# Patient Record
Sex: Female | Born: 1935
Health system: Southern US, Community
[De-identification: ages and names within clinical notes are randomized; demographics above are authoritative.]

## PROBLEM LIST (undated history)

## (undated) DIAGNOSIS — K219 Gastro-esophageal reflux disease without esophagitis: Secondary | ICD-10-CM

## (undated) DIAGNOSIS — E785 Hyperlipidemia, unspecified: Secondary | ICD-10-CM

## (undated) DIAGNOSIS — I10 Essential (primary) hypertension: Secondary | ICD-10-CM

## (undated) DIAGNOSIS — J449 Chronic obstructive pulmonary disease, unspecified: Secondary | ICD-10-CM

## (undated) DIAGNOSIS — I2510 Atherosclerotic heart disease of native coronary artery without angina pectoris: Secondary | ICD-10-CM

## (undated) DIAGNOSIS — E119 Type 2 diabetes mellitus without complications: Secondary | ICD-10-CM

## (undated) DIAGNOSIS — Z8673 Personal history of transient ischemic attack (TIA), and cerebral infarction without residual deficits: Secondary | ICD-10-CM

## (undated) HISTORY — PX: CHOLECYSTECTOMY: SHX55

---

## 2006-01-18 ENCOUNTER — Emergency Department (HOSPITAL_COMMUNITY): Admission: EM | Admit: 2006-01-18 | Discharge: 2006-01-18 | Payer: Self-pay | Admitting: Emergency Medicine

## 2012-05-12 DIAGNOSIS — M810 Age-related osteoporosis without current pathological fracture: Secondary | ICD-10-CM | POA: Diagnosis not present

## 2012-05-12 DIAGNOSIS — Z79899 Other long term (current) drug therapy: Secondary | ICD-10-CM | POA: Diagnosis not present

## 2012-05-12 DIAGNOSIS — I10 Essential (primary) hypertension: Secondary | ICD-10-CM | POA: Diagnosis not present

## 2012-05-12 DIAGNOSIS — Z1239 Encounter for other screening for malignant neoplasm of breast: Secondary | ICD-10-CM | POA: Diagnosis not present

## 2012-05-12 DIAGNOSIS — E7800 Pure hypercholesterolemia, unspecified: Secondary | ICD-10-CM | POA: Diagnosis not present

## 2012-05-12 DIAGNOSIS — E119 Type 2 diabetes mellitus without complications: Secondary | ICD-10-CM | POA: Diagnosis not present

## 2012-05-12 DIAGNOSIS — K219 Gastro-esophageal reflux disease without esophagitis: Secondary | ICD-10-CM | POA: Diagnosis not present

## 2012-05-28 DIAGNOSIS — Z1231 Encounter for screening mammogram for malignant neoplasm of breast: Secondary | ICD-10-CM | POA: Diagnosis not present

## 2012-05-28 DIAGNOSIS — M810 Age-related osteoporosis without current pathological fracture: Secondary | ICD-10-CM | POA: Diagnosis not present

## 2012-06-16 DIAGNOSIS — J3089 Other allergic rhinitis: Secondary | ICD-10-CM | POA: Diagnosis not present

## 2012-07-21 DIAGNOSIS — E559 Vitamin D deficiency, unspecified: Secondary | ICD-10-CM | POA: Diagnosis not present

## 2012-07-21 DIAGNOSIS — E119 Type 2 diabetes mellitus without complications: Secondary | ICD-10-CM | POA: Diagnosis not present

## 2012-07-21 DIAGNOSIS — I10 Essential (primary) hypertension: Secondary | ICD-10-CM | POA: Diagnosis not present

## 2012-07-21 DIAGNOSIS — E7800 Pure hypercholesterolemia, unspecified: Secondary | ICD-10-CM | POA: Diagnosis not present

## 2012-07-21 DIAGNOSIS — M818 Other osteoporosis without current pathological fracture: Secondary | ICD-10-CM | POA: Diagnosis not present

## 2012-09-20 DIAGNOSIS — H353190 Nonexudative age-related macular degeneration, unspecified eye, stage unspecified: Secondary | ICD-10-CM | POA: Diagnosis not present

## 2012-09-20 DIAGNOSIS — H26499 Other secondary cataract, unspecified eye: Secondary | ICD-10-CM | POA: Diagnosis not present

## 2012-10-21 DIAGNOSIS — M810 Age-related osteoporosis without current pathological fracture: Secondary | ICD-10-CM | POA: Diagnosis not present

## 2012-10-21 DIAGNOSIS — Z79899 Other long term (current) drug therapy: Secondary | ICD-10-CM | POA: Diagnosis not present

## 2012-10-21 DIAGNOSIS — E7800 Pure hypercholesterolemia, unspecified: Secondary | ICD-10-CM | POA: Diagnosis not present

## 2012-10-21 DIAGNOSIS — Z23 Encounter for immunization: Secondary | ICD-10-CM | POA: Diagnosis not present

## 2012-10-21 DIAGNOSIS — I10 Essential (primary) hypertension: Secondary | ICD-10-CM | POA: Diagnosis not present

## 2012-10-21 DIAGNOSIS — E119 Type 2 diabetes mellitus without complications: Secondary | ICD-10-CM | POA: Diagnosis not present

## 2012-10-29 DIAGNOSIS — E7800 Pure hypercholesterolemia, unspecified: Secondary | ICD-10-CM | POA: Diagnosis not present

## 2012-10-29 DIAGNOSIS — E119 Type 2 diabetes mellitus without complications: Secondary | ICD-10-CM | POA: Diagnosis not present

## 2012-10-29 DIAGNOSIS — Z79899 Other long term (current) drug therapy: Secondary | ICD-10-CM | POA: Diagnosis not present

## 2013-01-27 DIAGNOSIS — R0602 Shortness of breath: Secondary | ICD-10-CM | POA: Diagnosis not present

## 2013-01-27 DIAGNOSIS — R0609 Other forms of dyspnea: Secondary | ICD-10-CM | POA: Diagnosis not present

## 2013-01-27 DIAGNOSIS — E119 Type 2 diabetes mellitus without complications: Secondary | ICD-10-CM | POA: Diagnosis not present

## 2013-01-27 DIAGNOSIS — E7800 Pure hypercholesterolemia, unspecified: Secondary | ICD-10-CM | POA: Diagnosis not present

## 2013-01-27 DIAGNOSIS — I517 Cardiomegaly: Secondary | ICD-10-CM | POA: Diagnosis not present

## 2013-01-27 DIAGNOSIS — R0989 Other specified symptoms and signs involving the circulatory and respiratory systems: Secondary | ICD-10-CM | POA: Diagnosis not present

## 2013-01-27 DIAGNOSIS — I10 Essential (primary) hypertension: Secondary | ICD-10-CM | POA: Diagnosis not present

## 2013-01-27 DIAGNOSIS — Z79899 Other long term (current) drug therapy: Secondary | ICD-10-CM | POA: Diagnosis not present

## 2013-02-14 DIAGNOSIS — R498 Other voice and resonance disorders: Secondary | ICD-10-CM | POA: Diagnosis not present

## 2013-03-24 DIAGNOSIS — H353190 Nonexudative age-related macular degeneration, unspecified eye, stage unspecified: Secondary | ICD-10-CM | POA: Diagnosis not present

## 2013-03-24 DIAGNOSIS — H524 Presbyopia: Secondary | ICD-10-CM | POA: Diagnosis not present

## 2013-05-03 DIAGNOSIS — Z79899 Other long term (current) drug therapy: Secondary | ICD-10-CM | POA: Diagnosis not present

## 2013-05-03 DIAGNOSIS — E119 Type 2 diabetes mellitus without complications: Secondary | ICD-10-CM | POA: Diagnosis not present

## 2013-05-03 DIAGNOSIS — I10 Essential (primary) hypertension: Secondary | ICD-10-CM | POA: Diagnosis not present

## 2013-05-03 DIAGNOSIS — E7800 Pure hypercholesterolemia, unspecified: Secondary | ICD-10-CM | POA: Diagnosis not present

## 2013-05-09 DIAGNOSIS — M818 Other osteoporosis without current pathological fracture: Secondary | ICD-10-CM | POA: Diagnosis not present

## 2013-07-04 DIAGNOSIS — S82899A Other fracture of unspecified lower leg, initial encounter for closed fracture: Secondary | ICD-10-CM | POA: Diagnosis not present

## 2013-07-04 DIAGNOSIS — I10 Essential (primary) hypertension: Secondary | ICD-10-CM | POA: Diagnosis not present

## 2013-07-04 DIAGNOSIS — K219 Gastro-esophageal reflux disease without esophagitis: Secondary | ICD-10-CM | POA: Diagnosis not present

## 2013-07-04 DIAGNOSIS — M545 Low back pain, unspecified: Secondary | ICD-10-CM | POA: Diagnosis not present

## 2013-07-04 DIAGNOSIS — W010XXA Fall on same level from slipping, tripping and stumbling without subsequent striking against object, initial encounter: Secondary | ICD-10-CM | POA: Diagnosis not present

## 2013-07-04 DIAGNOSIS — E7800 Pure hypercholesterolemia, unspecified: Secondary | ICD-10-CM | POA: Diagnosis not present

## 2013-07-06 DIAGNOSIS — M25476 Effusion, unspecified foot: Secondary | ICD-10-CM | POA: Diagnosis not present

## 2013-07-06 DIAGNOSIS — M25579 Pain in unspecified ankle and joints of unspecified foot: Secondary | ICD-10-CM | POA: Diagnosis not present

## 2013-07-06 DIAGNOSIS — M25473 Effusion, unspecified ankle: Secondary | ICD-10-CM | POA: Diagnosis not present

## 2013-07-06 DIAGNOSIS — S8263XA Displaced fracture of lateral malleolus of unspecified fibula, initial encounter for closed fracture: Secondary | ICD-10-CM | POA: Diagnosis not present

## 2013-07-13 DIAGNOSIS — M25476 Effusion, unspecified foot: Secondary | ICD-10-CM | POA: Diagnosis not present

## 2013-07-13 DIAGNOSIS — S8263XA Displaced fracture of lateral malleolus of unspecified fibula, initial encounter for closed fracture: Secondary | ICD-10-CM | POA: Diagnosis not present

## 2013-07-13 DIAGNOSIS — M25473 Effusion, unspecified ankle: Secondary | ICD-10-CM | POA: Diagnosis not present

## 2013-07-13 DIAGNOSIS — M25579 Pain in unspecified ankle and joints of unspecified foot: Secondary | ICD-10-CM | POA: Diagnosis not present

## 2013-07-20 DIAGNOSIS — M25473 Effusion, unspecified ankle: Secondary | ICD-10-CM | POA: Diagnosis not present

## 2013-07-20 DIAGNOSIS — M25476 Effusion, unspecified foot: Secondary | ICD-10-CM | POA: Diagnosis not present

## 2013-07-20 DIAGNOSIS — S8263XA Displaced fracture of lateral malleolus of unspecified fibula, initial encounter for closed fracture: Secondary | ICD-10-CM | POA: Diagnosis not present

## 2013-07-20 DIAGNOSIS — M25579 Pain in unspecified ankle and joints of unspecified foot: Secondary | ICD-10-CM | POA: Diagnosis not present

## 2013-08-03 DIAGNOSIS — E119 Type 2 diabetes mellitus without complications: Secondary | ICD-10-CM | POA: Diagnosis not present

## 2013-08-03 DIAGNOSIS — R05 Cough: Secondary | ICD-10-CM | POA: Diagnosis not present

## 2013-08-03 DIAGNOSIS — M810 Age-related osteoporosis without current pathological fracture: Secondary | ICD-10-CM | POA: Diagnosis not present

## 2013-08-03 DIAGNOSIS — E7800 Pure hypercholesterolemia, unspecified: Secondary | ICD-10-CM | POA: Diagnosis not present

## 2013-08-03 DIAGNOSIS — K219 Gastro-esophageal reflux disease without esophagitis: Secondary | ICD-10-CM | POA: Diagnosis not present

## 2013-08-03 DIAGNOSIS — I10 Essential (primary) hypertension: Secondary | ICD-10-CM | POA: Diagnosis not present

## 2013-08-17 DIAGNOSIS — S8263XA Displaced fracture of lateral malleolus of unspecified fibula, initial encounter for closed fracture: Secondary | ICD-10-CM | POA: Diagnosis not present

## 2013-08-22 DIAGNOSIS — S8263XA Displaced fracture of lateral malleolus of unspecified fibula, initial encounter for closed fracture: Secondary | ICD-10-CM | POA: Diagnosis not present

## 2013-08-25 DIAGNOSIS — S8263XA Displaced fracture of lateral malleolus of unspecified fibula, initial encounter for closed fracture: Secondary | ICD-10-CM | POA: Diagnosis not present

## 2013-08-30 DIAGNOSIS — S8263XA Displaced fracture of lateral malleolus of unspecified fibula, initial encounter for closed fracture: Secondary | ICD-10-CM | POA: Diagnosis not present

## 2013-09-01 DIAGNOSIS — S8263XA Displaced fracture of lateral malleolus of unspecified fibula, initial encounter for closed fracture: Secondary | ICD-10-CM | POA: Diagnosis not present

## 2013-09-06 DIAGNOSIS — S8263XA Displaced fracture of lateral malleolus of unspecified fibula, initial encounter for closed fracture: Secondary | ICD-10-CM | POA: Diagnosis not present

## 2013-09-09 DIAGNOSIS — S8263XA Displaced fracture of lateral malleolus of unspecified fibula, initial encounter for closed fracture: Secondary | ICD-10-CM | POA: Diagnosis not present

## 2013-09-13 DIAGNOSIS — S8263XA Displaced fracture of lateral malleolus of unspecified fibula, initial encounter for closed fracture: Secondary | ICD-10-CM | POA: Diagnosis not present

## 2013-09-14 DIAGNOSIS — E119 Type 2 diabetes mellitus without complications: Secondary | ICD-10-CM | POA: Diagnosis not present

## 2013-09-14 DIAGNOSIS — I10 Essential (primary) hypertension: Secondary | ICD-10-CM | POA: Diagnosis not present

## 2013-09-14 DIAGNOSIS — Z23 Encounter for immunization: Secondary | ICD-10-CM | POA: Diagnosis not present

## 2013-09-14 DIAGNOSIS — Z79899 Other long term (current) drug therapy: Secondary | ICD-10-CM | POA: Diagnosis not present

## 2013-09-14 DIAGNOSIS — E7800 Pure hypercholesterolemia, unspecified: Secondary | ICD-10-CM | POA: Diagnosis not present

## 2013-09-16 DIAGNOSIS — S8263XA Displaced fracture of lateral malleolus of unspecified fibula, initial encounter for closed fracture: Secondary | ICD-10-CM | POA: Diagnosis not present

## 2013-09-23 DIAGNOSIS — S8263XA Displaced fracture of lateral malleolus of unspecified fibula, initial encounter for closed fracture: Secondary | ICD-10-CM | POA: Diagnosis not present

## 2013-12-19 DIAGNOSIS — E559 Vitamin D deficiency, unspecified: Secondary | ICD-10-CM | POA: Diagnosis not present

## 2013-12-19 DIAGNOSIS — E1149 Type 2 diabetes mellitus with other diabetic neurological complication: Secondary | ICD-10-CM | POA: Diagnosis not present

## 2013-12-19 DIAGNOSIS — M810 Age-related osteoporosis without current pathological fracture: Secondary | ICD-10-CM | POA: Diagnosis not present

## 2013-12-19 DIAGNOSIS — Z0000 Encounter for general adult medical examination without abnormal findings: Secondary | ICD-10-CM | POA: Diagnosis not present

## 2013-12-19 DIAGNOSIS — Z79899 Other long term (current) drug therapy: Secondary | ICD-10-CM | POA: Diagnosis not present

## 2013-12-19 DIAGNOSIS — I10 Essential (primary) hypertension: Secondary | ICD-10-CM | POA: Diagnosis not present

## 2013-12-19 DIAGNOSIS — E119 Type 2 diabetes mellitus without complications: Secondary | ICD-10-CM | POA: Diagnosis not present

## 2013-12-19 DIAGNOSIS — I2510 Atherosclerotic heart disease of native coronary artery without angina pectoris: Secondary | ICD-10-CM | POA: Diagnosis not present

## 2013-12-19 DIAGNOSIS — E7800 Pure hypercholesterolemia, unspecified: Secondary | ICD-10-CM | POA: Diagnosis not present

## 2014-03-20 DIAGNOSIS — Z23 Encounter for immunization: Secondary | ICD-10-CM | POA: Diagnosis not present

## 2014-03-20 DIAGNOSIS — I10 Essential (primary) hypertension: Secondary | ICD-10-CM | POA: Diagnosis not present

## 2014-03-20 DIAGNOSIS — E1149 Type 2 diabetes mellitus with other diabetic neurological complication: Secondary | ICD-10-CM | POA: Diagnosis not present

## 2014-03-20 DIAGNOSIS — E7800 Pure hypercholesterolemia, unspecified: Secondary | ICD-10-CM | POA: Diagnosis not present

## 2014-06-20 DIAGNOSIS — Z79899 Other long term (current) drug therapy: Secondary | ICD-10-CM | POA: Diagnosis not present

## 2014-06-20 DIAGNOSIS — E7800 Pure hypercholesterolemia, unspecified: Secondary | ICD-10-CM | POA: Diagnosis not present

## 2014-06-20 DIAGNOSIS — I10 Essential (primary) hypertension: Secondary | ICD-10-CM | POA: Diagnosis not present

## 2014-06-20 DIAGNOSIS — E1149 Type 2 diabetes mellitus with other diabetic neurological complication: Secondary | ICD-10-CM | POA: Diagnosis not present

## 2014-06-20 DIAGNOSIS — J0100 Acute maxillary sinusitis, unspecified: Secondary | ICD-10-CM | POA: Diagnosis not present

## 2014-07-24 DIAGNOSIS — K219 Gastro-esophageal reflux disease without esophagitis: Secondary | ICD-10-CM | POA: Diagnosis not present

## 2014-07-24 DIAGNOSIS — I10 Essential (primary) hypertension: Secondary | ICD-10-CM | POA: Diagnosis not present

## 2014-08-31 DIAGNOSIS — J0110 Acute frontal sinusitis, unspecified: Secondary | ICD-10-CM | POA: Diagnosis not present

## 2014-10-24 DIAGNOSIS — J0100 Acute maxillary sinusitis, unspecified: Secondary | ICD-10-CM | POA: Diagnosis not present

## 2014-10-24 DIAGNOSIS — Z23 Encounter for immunization: Secondary | ICD-10-CM | POA: Diagnosis not present

## 2014-11-03 DIAGNOSIS — I10 Essential (primary) hypertension: Secondary | ICD-10-CM | POA: Diagnosis not present

## 2014-11-03 DIAGNOSIS — E669 Obesity, unspecified: Secondary | ICD-10-CM | POA: Diagnosis present

## 2014-11-03 DIAGNOSIS — I517 Cardiomegaly: Secondary | ICD-10-CM | POA: Diagnosis not present

## 2014-11-03 DIAGNOSIS — J9621 Acute and chronic respiratory failure with hypoxia: Secondary | ICD-10-CM | POA: Diagnosis not present

## 2014-11-03 DIAGNOSIS — E785 Hyperlipidemia, unspecified: Secondary | ICD-10-CM | POA: Diagnosis not present

## 2014-11-03 DIAGNOSIS — R05 Cough: Secondary | ICD-10-CM | POA: Diagnosis not present

## 2014-11-03 DIAGNOSIS — J440 Chronic obstructive pulmonary disease with acute lower respiratory infection: Secondary | ICD-10-CM | POA: Diagnosis present

## 2014-11-03 DIAGNOSIS — J209 Acute bronchitis, unspecified: Secondary | ICD-10-CM | POA: Diagnosis not present

## 2014-11-03 DIAGNOSIS — R5381 Other malaise: Secondary | ICD-10-CM | POA: Diagnosis not present

## 2014-11-03 DIAGNOSIS — J180 Bronchopneumonia, unspecified organism: Secondary | ICD-10-CM | POA: Diagnosis not present

## 2014-11-03 DIAGNOSIS — J441 Chronic obstructive pulmonary disease with (acute) exacerbation: Secondary | ICD-10-CM | POA: Diagnosis not present

## 2014-11-03 DIAGNOSIS — R069 Unspecified abnormalities of breathing: Secondary | ICD-10-CM | POA: Diagnosis not present

## 2014-11-03 DIAGNOSIS — E119 Type 2 diabetes mellitus without complications: Secondary | ICD-10-CM | POA: Diagnosis present

## 2014-11-03 DIAGNOSIS — J9691 Respiratory failure, unspecified with hypoxia: Secondary | ICD-10-CM | POA: Diagnosis not present

## 2014-11-03 DIAGNOSIS — J9601 Acute respiratory failure with hypoxia: Secondary | ICD-10-CM | POA: Diagnosis not present

## 2014-11-03 DIAGNOSIS — R1312 Dysphagia, oropharyngeal phase: Secondary | ICD-10-CM | POA: Diagnosis not present

## 2014-11-03 DIAGNOSIS — J80 Acute respiratory distress syndrome: Secondary | ICD-10-CM | POA: Diagnosis not present

## 2014-11-03 DIAGNOSIS — R0602 Shortness of breath: Secondary | ICD-10-CM | POA: Diagnosis not present

## 2014-11-03 DIAGNOSIS — J9690 Respiratory failure, unspecified, unspecified whether with hypoxia or hypercapnia: Secondary | ICD-10-CM | POA: Diagnosis not present

## 2014-11-03 DIAGNOSIS — J9622 Acute and chronic respiratory failure with hypercapnia: Secondary | ICD-10-CM | POA: Diagnosis present

## 2014-11-03 DIAGNOSIS — J189 Pneumonia, unspecified organism: Secondary | ICD-10-CM | POA: Diagnosis not present

## 2014-11-03 DIAGNOSIS — Z79899 Other long term (current) drug therapy: Secondary | ICD-10-CM | POA: Diagnosis not present

## 2014-11-08 DIAGNOSIS — J80 Acute respiratory distress syndrome: Secondary | ICD-10-CM | POA: Diagnosis not present

## 2014-11-08 DIAGNOSIS — J441 Chronic obstructive pulmonary disease with (acute) exacerbation: Secondary | ICD-10-CM | POA: Diagnosis not present

## 2014-11-08 DIAGNOSIS — J189 Pneumonia, unspecified organism: Secondary | ICD-10-CM | POA: Diagnosis not present

## 2014-11-08 DIAGNOSIS — R1312 Dysphagia, oropharyngeal phase: Secondary | ICD-10-CM | POA: Diagnosis not present

## 2014-11-08 DIAGNOSIS — R1310 Dysphagia, unspecified: Secondary | ICD-10-CM | POA: Diagnosis not present

## 2014-11-08 DIAGNOSIS — J9621 Acute and chronic respiratory failure with hypoxia: Secondary | ICD-10-CM | POA: Diagnosis not present

## 2014-11-08 DIAGNOSIS — I10 Essential (primary) hypertension: Secondary | ICD-10-CM | POA: Diagnosis not present

## 2014-11-08 DIAGNOSIS — J9601 Acute respiratory failure with hypoxia: Secondary | ICD-10-CM | POA: Diagnosis not present

## 2014-11-08 DIAGNOSIS — E785 Hyperlipidemia, unspecified: Secondary | ICD-10-CM | POA: Diagnosis not present

## 2014-11-08 DIAGNOSIS — J9691 Respiratory failure, unspecified with hypoxia: Secondary | ICD-10-CM | POA: Diagnosis not present

## 2014-11-08 DIAGNOSIS — J180 Bronchopneumonia, unspecified organism: Secondary | ICD-10-CM | POA: Diagnosis not present

## 2014-11-08 DIAGNOSIS — R5381 Other malaise: Secondary | ICD-10-CM | POA: Diagnosis not present

## 2014-11-13 DIAGNOSIS — R1310 Dysphagia, unspecified: Secondary | ICD-10-CM | POA: Diagnosis not present

## 2014-11-13 DIAGNOSIS — J189 Pneumonia, unspecified organism: Secondary | ICD-10-CM | POA: Diagnosis not present

## 2014-11-13 DIAGNOSIS — J441 Chronic obstructive pulmonary disease with (acute) exacerbation: Secondary | ICD-10-CM | POA: Diagnosis not present

## 2014-11-13 DIAGNOSIS — J9601 Acute respiratory failure with hypoxia: Secondary | ICD-10-CM | POA: Diagnosis not present

## 2014-11-23 DIAGNOSIS — Z8701 Personal history of pneumonia (recurrent): Secondary | ICD-10-CM | POA: Diagnosis not present

## 2014-11-23 DIAGNOSIS — R262 Difficulty in walking, not elsewhere classified: Secondary | ICD-10-CM | POA: Diagnosis not present

## 2014-11-23 DIAGNOSIS — M6281 Muscle weakness (generalized): Secondary | ICD-10-CM | POA: Diagnosis not present

## 2014-11-24 DIAGNOSIS — R262 Difficulty in walking, not elsewhere classified: Secondary | ICD-10-CM | POA: Diagnosis not present

## 2014-11-24 DIAGNOSIS — Z8701 Personal history of pneumonia (recurrent): Secondary | ICD-10-CM | POA: Diagnosis not present

## 2014-11-24 DIAGNOSIS — M6281 Muscle weakness (generalized): Secondary | ICD-10-CM | POA: Diagnosis not present

## 2014-11-24 DIAGNOSIS — R21 Rash and other nonspecific skin eruption: Secondary | ICD-10-CM | POA: Diagnosis not present

## 2014-11-24 DIAGNOSIS — T7840XA Allergy, unspecified, initial encounter: Secondary | ICD-10-CM | POA: Diagnosis not present

## 2014-11-27 DIAGNOSIS — Z8701 Personal history of pneumonia (recurrent): Secondary | ICD-10-CM | POA: Diagnosis not present

## 2014-11-27 DIAGNOSIS — M6281 Muscle weakness (generalized): Secondary | ICD-10-CM | POA: Diagnosis not present

## 2014-11-27 DIAGNOSIS — R262 Difficulty in walking, not elsewhere classified: Secondary | ICD-10-CM | POA: Diagnosis not present

## 2014-11-28 DIAGNOSIS — Z8701 Personal history of pneumonia (recurrent): Secondary | ICD-10-CM | POA: Diagnosis not present

## 2014-11-28 DIAGNOSIS — M6281 Muscle weakness (generalized): Secondary | ICD-10-CM | POA: Diagnosis not present

## 2014-11-28 DIAGNOSIS — R262 Difficulty in walking, not elsewhere classified: Secondary | ICD-10-CM | POA: Diagnosis not present

## 2014-12-05 DIAGNOSIS — Z8701 Personal history of pneumonia (recurrent): Secondary | ICD-10-CM | POA: Diagnosis not present

## 2014-12-05 DIAGNOSIS — M6281 Muscle weakness (generalized): Secondary | ICD-10-CM | POA: Diagnosis not present

## 2014-12-05 DIAGNOSIS — R262 Difficulty in walking, not elsewhere classified: Secondary | ICD-10-CM | POA: Diagnosis not present

## 2014-12-06 DIAGNOSIS — M6281 Muscle weakness (generalized): Secondary | ICD-10-CM | POA: Diagnosis not present

## 2014-12-06 DIAGNOSIS — Z8701 Personal history of pneumonia (recurrent): Secondary | ICD-10-CM | POA: Diagnosis not present

## 2014-12-06 DIAGNOSIS — R262 Difficulty in walking, not elsewhere classified: Secondary | ICD-10-CM | POA: Diagnosis not present

## 2014-12-11 DIAGNOSIS — R262 Difficulty in walking, not elsewhere classified: Secondary | ICD-10-CM | POA: Diagnosis not present

## 2014-12-11 DIAGNOSIS — M6281 Muscle weakness (generalized): Secondary | ICD-10-CM | POA: Diagnosis not present

## 2014-12-11 DIAGNOSIS — Z8701 Personal history of pneumonia (recurrent): Secondary | ICD-10-CM | POA: Diagnosis not present

## 2014-12-13 DIAGNOSIS — Z8701 Personal history of pneumonia (recurrent): Secondary | ICD-10-CM | POA: Diagnosis not present

## 2014-12-13 DIAGNOSIS — M6281 Muscle weakness (generalized): Secondary | ICD-10-CM | POA: Diagnosis not present

## 2014-12-13 DIAGNOSIS — R262 Difficulty in walking, not elsewhere classified: Secondary | ICD-10-CM | POA: Diagnosis not present

## 2014-12-14 ENCOUNTER — Inpatient Hospital Stay (HOSPITAL_COMMUNITY)
Admission: EM | Admit: 2014-12-14 | Discharge: 2014-12-16 | DRG: 176 | Disposition: A | Discharge status: Home / Self Care | Payer: Medicare Other | Source: Other Acute Inpatient Hospital | Attending: Internal Medicine | Admitting: Internal Medicine

## 2014-12-14 DIAGNOSIS — R0602 Shortness of breath: Secondary | ICD-10-CM | POA: Diagnosis not present

## 2014-12-14 DIAGNOSIS — R1310 Dysphagia, unspecified: Secondary | ICD-10-CM | POA: Diagnosis not present

## 2014-12-14 DIAGNOSIS — K210 Gastro-esophageal reflux disease with esophagitis: Secondary | ICD-10-CM | POA: Diagnosis not present

## 2014-12-14 DIAGNOSIS — R531 Weakness: Secondary | ICD-10-CM | POA: Diagnosis not present

## 2014-12-14 DIAGNOSIS — J449 Chronic obstructive pulmonary disease, unspecified: Secondary | ICD-10-CM | POA: Diagnosis present

## 2014-12-14 DIAGNOSIS — E785 Hyperlipidemia, unspecified: Secondary | ICD-10-CM | POA: Diagnosis present

## 2014-12-14 DIAGNOSIS — I2699 Other pulmonary embolism without acute cor pulmonale: Secondary | ICD-10-CM | POA: Diagnosis not present

## 2014-12-14 DIAGNOSIS — J189 Pneumonia, unspecified organism: Secondary | ICD-10-CM | POA: Diagnosis not present

## 2014-12-14 DIAGNOSIS — J9601 Acute respiratory failure with hypoxia: Secondary | ICD-10-CM | POA: Diagnosis not present

## 2014-12-14 DIAGNOSIS — I10 Essential (primary) hypertension: Secondary | ICD-10-CM

## 2014-12-14 DIAGNOSIS — I824Z1 Acute embolism and thrombosis of unspecified deep veins of right distal lower extremity: Secondary | ICD-10-CM | POA: Diagnosis present

## 2014-12-14 DIAGNOSIS — J441 Chronic obstructive pulmonary disease with (acute) exacerbation: Secondary | ICD-10-CM | POA: Diagnosis present

## 2014-12-14 DIAGNOSIS — E780 Pure hypercholesterolemia: Secondary | ICD-10-CM | POA: Diagnosis not present

## 2014-12-14 DIAGNOSIS — I2510 Atherosclerotic heart disease of native coronary artery without angina pectoris: Secondary | ICD-10-CM | POA: Diagnosis present

## 2014-12-14 DIAGNOSIS — Z794 Long term (current) use of insulin: Secondary | ICD-10-CM

## 2014-12-14 DIAGNOSIS — Z87891 Personal history of nicotine dependence: Secondary | ICD-10-CM | POA: Diagnosis not present

## 2014-12-14 DIAGNOSIS — I517 Cardiomegaly: Secondary | ICD-10-CM | POA: Diagnosis not present

## 2014-12-14 DIAGNOSIS — K219 Gastro-esophageal reflux disease without esophagitis: Secondary | ICD-10-CM | POA: Diagnosis present

## 2014-12-14 DIAGNOSIS — Z8673 Personal history of transient ischemic attack (TIA), and cerebral infarction without residual deficits: Secondary | ICD-10-CM

## 2014-12-14 DIAGNOSIS — R0789 Other chest pain: Secondary | ICD-10-CM | POA: Diagnosis not present

## 2014-12-14 DIAGNOSIS — I359 Nonrheumatic aortic valve disorder, unspecified: Secondary | ICD-10-CM | POA: Diagnosis not present

## 2014-12-14 DIAGNOSIS — Z9981 Dependence on supplemental oxygen: Secondary | ICD-10-CM | POA: Diagnosis not present

## 2014-12-14 DIAGNOSIS — I749 Embolism and thrombosis of unspecified artery: Secondary | ICD-10-CM | POA: Diagnosis not present

## 2014-12-14 DIAGNOSIS — E119 Type 2 diabetes mellitus without complications: Secondary | ICD-10-CM | POA: Diagnosis present

## 2014-12-14 DIAGNOSIS — E118 Type 2 diabetes mellitus with unspecified complications: Secondary | ICD-10-CM | POA: Diagnosis not present

## 2014-12-14 DIAGNOSIS — R0902 Hypoxemia: Secondary | ICD-10-CM | POA: Diagnosis not present

## 2014-12-14 DIAGNOSIS — R404 Transient alteration of awareness: Secondary | ICD-10-CM | POA: Diagnosis not present

## 2014-12-14 DIAGNOSIS — R079 Chest pain, unspecified: Secondary | ICD-10-CM | POA: Diagnosis not present

## 2014-12-14 DIAGNOSIS — Z743 Need for continuous supervision: Secondary | ICD-10-CM | POA: Diagnosis not present

## 2014-12-14 HISTORY — DX: Atherosclerotic heart disease of native coronary artery without angina pectoris: I25.10

## 2014-12-14 HISTORY — DX: Essential (primary) hypertension: I10

## 2014-12-14 HISTORY — DX: Gastro-esophageal reflux disease without esophagitis: K21.9

## 2014-12-14 HISTORY — DX: Type 2 diabetes mellitus without complications: E11.9

## 2014-12-14 HISTORY — DX: Personal history of transient ischemic attack (TIA), and cerebral infarction without residual deficits: Z86.73

## 2014-12-14 HISTORY — DX: Hyperlipidemia, unspecified: E78.5

## 2014-12-14 HISTORY — DX: Chronic obstructive pulmonary disease, unspecified: J44.9

## 2014-12-14 MED ORDER — MONTELUKAST SODIUM 10 MG PO TABS
10.0000 mg | ORAL_TABLET | Freq: Every day | ORAL | Status: DC
  Administered 2014-12-15: 10 mg via ORAL
  Filled 2014-12-14 (×3): qty 1

## 2014-12-14 MED ORDER — INSULIN ASPART 100 UNIT/ML ~~LOC~~ SOLN: 0.0000 [IU] | Freq: Three times a day (TID) | SUBCUTANEOUS | Status: DC

## 2014-12-14 MED ORDER — VITAMIN D3 25 MCG (1000 UNIT) PO TABS
1000.0000 [IU] | ORAL_TABLET | Freq: Every day | ORAL | Status: DC
  Administered 2014-12-15 – 2014-12-16 (×2): 1000 [IU] via ORAL
  Filled 2014-12-14 (×3): qty 1

## 2014-12-14 MED ORDER — IRBESARTAN 300 MG PO TABS
300.0000 mg | ORAL_TABLET | Freq: Every day | ORAL | Status: DC
  Administered 2014-12-15 – 2014-12-16 (×2): 300 mg via ORAL
  Filled 2014-12-14 (×2): qty 1

## 2014-12-14 MED ORDER — ATORVASTATIN CALCIUM 20 MG PO TABS
20.0000 mg | ORAL_TABLET | Freq: Every day | ORAL | Status: DC
  Administered 2014-12-15: 20 mg via ORAL
  Filled 2014-12-14: qty 1
  Filled 2014-12-14: qty 2

## 2014-12-14 MED ORDER — HYDROMORPHONE HCL 1 MG/ML IJ SOLN: 1.0000 mg | INTRAMUSCULAR | Status: DC | PRN

## 2014-12-14 MED ORDER — SODIUM CHLORIDE 0.9 % IJ SOLN
3.0000 mL | Freq: Two times a day (BID) | INTRAMUSCULAR | Status: DC
  Administered 2014-12-14 – 2014-12-16 (×3): 3 mL via INTRAVENOUS

## 2014-12-14 MED ORDER — TRAMADOL HCL 50 MG PO TABS
50.0000 mg | ORAL_TABLET | Freq: Four times a day (QID) | ORAL | Status: DC | PRN
  Administered 2014-12-16: 50 mg via ORAL
  Filled 2014-12-14: qty 1

## 2014-12-14 MED ORDER — METOPROLOL SUCCINATE ER 25 MG PO TB24
25.0000 mg | ORAL_TABLET | Freq: Every day | ORAL | Status: DC
  Administered 2014-12-15 – 2014-12-16 (×2): 25 mg via ORAL
  Filled 2014-12-14 (×2): qty 1

## 2014-12-14 MED ORDER — FLUTICASONE PROPIONATE 50 MCG/ACT NA SUSP
1.0000 | Freq: Every day | NASAL | Status: DC
  Administered 2014-12-15 – 2014-12-16 (×2): 2 via NASAL
  Filled 2014-12-14 (×2): qty 16

## 2014-12-14 MED ORDER — ALBUTEROL SULFATE (2.5 MG/3ML) 0.083% IN NEBU: 2.5000 mg | INHALATION_SOLUTION | Freq: Four times a day (QID) | RESPIRATORY_TRACT | Status: DC | PRN

## 2014-12-14 MED ORDER — ASPIRIN 81 MG PO CHEW
81.0000 mg | CHEWABLE_TABLET | Freq: Every day | ORAL | Status: DC
  Administered 2014-12-15 – 2014-12-16 (×2): 81 mg via ORAL
  Filled 2014-12-14 (×2): qty 1

## 2014-12-14 MED ORDER — AMLODIPINE BESYLATE 10 MG PO TABS
10.0000 mg | ORAL_TABLET | Freq: Every day | ORAL | Status: DC
  Administered 2014-12-15 – 2014-12-16 (×2): 10 mg via ORAL
  Filled 2014-12-14 (×2): qty 1

## 2014-12-14 MED ORDER — PANTOPRAZOLE SODIUM 40 MG PO TBEC
40.0000 mg | DELAYED_RELEASE_TABLET | Freq: Every day | ORAL | Status: DC
  Administered 2014-12-15 – 2014-12-16 (×2): 40 mg via ORAL
  Filled 2014-12-14 (×2): qty 1

## 2014-12-14 MED ORDER — CALCIUM CARBONATE-VITAMIN D 500-200 MG-UNIT PO TABS
1.0000 | ORAL_TABLET | Freq: Every day | ORAL | Status: DC
  Administered 2014-12-15 – 2014-12-16 (×2): 1 via ORAL
  Filled 2014-12-14 (×2): qty 1

## 2014-12-14 MED ORDER — SODIUM CHLORIDE 0.9 % IV SOLN
INTRAVENOUS | Status: DC
  Administered 2014-12-15 (×2): via INTRAVENOUS

## 2014-12-14 NOTE — H&P (Signed)
[facility] History and Physical  Heidi Jefferson DGU:[id number] DOB: 05-01-1923 DOA: 12/14/2014  Referring physician: ED physician PCP: No primary care provider on file.  Specialists:   Chief Complaint: sob and chest tightness  HPI: Heidi Jefferson is a 81 y.o. female with past medical history of hypertension, hyperlipidemia, GERD, coronary artery disease, history of stroke, COPD, diabetes mellitus, who presented with shortness of breath and chest tightness.  Patient reports that she was treated in [facility] hospital for pneumonia recently. She completed antibiotics treatment, and was discharged to [facility] for rehab. She has been doing fine in the rehabilitation center until one week ago, when she started having shortness of breath and chest tightness. He does not have significant chest pain, fever or chills. She was brought to [facility] today. She was found to have bilateral large pulmonary embolism with right heart screening. ED consulted PCCM, who suggesedt to admit patient to stepdown unit. Because of lack of bed in [facility] hospital, patient is transferred to [facility]. When I evaluated the patient on the floor, patient has mild shortness of breath, but no chest pain. Patient complains of tenderness over bilateral calf areas.  Work up in the [facility] ED demonstrates negative chest x-ray for acute abnormalities. CTA showed bilateral large PE with right heart staining pattern. INR 1.0, negative urinalysis, troponin negative. ProBNP 3660. Patient is admitted to stepdown unit for further evaluation and treatment.  Review of Systems: As presented in the history of presenting illness, rest negative.  Where does patient live?  From SNF Can patient participate in ADLs? none  Allergy:  Allergies  Allergen Reactions  . Arlice Colt Isothiocyanate] Hives  . Zestril [Lisinopril] Cough  . Zithromax [Azithromycin] Other (See Comments)    dizziness  . Codeine Nausea  And Vomiting and Rash  . Novocain [Procaine] Rash    Past Medical History  Diagnosis Date  . Hypertension   . Hyperlipidemia   . GERD (gastroesophageal reflux disease)   . Coronary artery disease   . History of stroke   . COPD (chronic obstructive pulmonary disease)   . Diabetes mellitus     Past Surgical History  Procedure Laterality Date  . Cholecystectomy      Social History:  has no tobacco, alcohol, and drug history on file.  Family History:  Family History  Problem Relation Age of Onset  . Hip fracture Mother      Prior to Admission medications   Medication Sig Start Date End Date Taking? Authorizing Provider  amLODipine (NORVASC) 10 MG tablet Take 10 mg by mouth daily.   Yes Historical Provider, MD  aspirin 81 MG chewable tablet Chew 81 mg by mouth daily.   Yes Historical Provider, MD  atorvastatin (LIPITOR) 20 MG tablet Take 20 mg by mouth daily.   Yes Historical Provider, MD  Calcium Carb-Cholecalciferol (CALTRATE 600+D) 600-800 MG-UNIT TABS Take 1 tablet by mouth daily.   Yes Historical Provider, MD  cholecalciferol (VITAMIN D) 1000 UNITS tablet Take 1,000 Units by mouth daily.   Yes Historical Provider, MD  dexlansoprazole (DEXILANT) 60 MG capsule Take 60 mg by mouth daily.   Yes Historical Provider, MD  fluticasone (FLONASE) 50 MCG/ACT nasal spray Place 1-2 sprays into both nostrils daily.   Yes Historical Provider, MD  furosemide (LASIX) 40 MG tablet Take 40 mg by mouth daily.   Yes Historical Provider, MD  ibandronate (BONIVA) 150 MG tablet Take 150 mg by mouth every 30 (thirty) days. Take in the  morning with a full glass of water, on an empty stomach, and do not take anything else by mouth or lie down for the next 30 min.   Yes Historical Provider, MD  metoprolol succinate (TOPROL-XL) 25 MG 24 hr tablet Take 25 mg by mouth daily.   Yes Historical Provider, MD  montelukast (SINGULAIR) 10 MG tablet Take 10 mg by mouth at bedtime.   Yes Historical Provider, MD   traMADol (ULTRAM) 50 MG tablet Take 50-100 mg by mouth every 6 (six) hours as needed for moderate pain.   Yes Historical Provider, MD  valsartan (DIOVAN) 320 MG tablet Take 320 mg by mouth daily.   Yes Historical Provider, MD    Physical Exam: Filed Vitals:   12/14/14 2204 12/15/14 0000  BP: 140/61 122/61  Pulse: 88   Temp: 98.5 F (36.9 C) 97.9 F (36.6 C)  TempSrc: Oral Oral  Height: 5\' 3"  (1.6 m)   Weight: 84.4 kg (186 lb 1.1 oz)   SpO2: 98%    General: Not in acute distress HEENT:       Eyes: PERRL, EOMI, no scleral icterus       ENT: No discharge from the ears and nose, no pharynx injection, no tonsillar enlargement.        Neck: No JVD, no bruit, no mass felt. Cardiac: S1/S2, RRR, 2/6 systolic murmurs, No gallops or rubs Pulm: Good air movement bilaterally. Clear to auscultation bilaterally. No rales, wheezing, rhonchi or rubs. Abd: Soft, nondistended, nontender, no rebound pain, no organomegaly, BS present Ext: No edema bilaterally. 2+DP/PT pulse bilaterally. Tenderness over calf areas bilaterally Musculoskeletal: No joint deformities, erythema, or stiffness, ROM full Skin: No rashes.  Neuro: Alert and oriented X3, cranial nerves II-XII grossly intact, muscle strength 5/5 in all extremeties, sensation to light touch intact.  Psych: Patient is not psychotic, no suicidal or hemocidal ideation.  Labs on Admission:  Basic Metabolic Panel: No results for input(s): NA, K, CL, CO2, GLUCOSE, BUN, CREATININE, CALCIUM, MG, PHOS in the last 168 hours. Liver Function Tests: No results for input(s): AST, ALT, ALKPHOS, BILITOT, PROT, ALBUMIN in the last 168 hours. No results for input(s): LIPASE, AMYLASE in the last 168 hours. No results for input(s): AMMONIA in the last 168 hours. CBC:  Recent Labs Lab 12/15/14 0100  WBC 8.0  NEUTROABS 7.0  HGB 10.3*  HCT 32.1*  MCV 96.1  PLT 332   Cardiac Enzymes:  Recent Labs Lab 12/15/14 0100  TROPONINI 0.03    BNP (last 3  results) No results for input(s): PROBNP in the last 8760 hours. CBG: No results for input(s): GLUCAP in the last 168 hours.  Radiological Exams on Admission: No results found.  EKG: will get one   Assessment/Plan Principal Problem:   Acute pulmonary embolism Active Problems:   HTN (hypertension)   HLD (hyperlipidemia)   GERD (gastroesophageal reflux disease)   CAD (coronary artery disease)   History of stroke   COPD exacerbation   PE (pulmonary embolism)  Acute pulmonary embolism: Patient has bilateral large PE with right heart strain. PCCM was consulted, suggested to admit patient to stepdown unit. Currently patient is hemodynamically stable -will admit to sdu for close monitoring overnight -heparin drip initiated -2D echocardiogram ordered -LE dopplers ordered to evaluate for DVT -pain control: percocet and dilaudid prn -Hold the Lasix -Give gentle fluids: Normal saline 50 mL per hour  Hypertension - Continue amlodipine -Hold Lasix -Continue irbesartan -continue metoprolol  Diabetes mellitus: Not on medications at home. No  A1c on record. - sliding-scale insulin -Check A1c  Coronary artery disease: Patient does not have significant chest pain. Initial troponin was negative at [facility]. -Troponin x3 -Get EKG.  HLD:  -Continue home Lipitor 20 mg daily  COPD: Stable, no acute exacerbation -Albuterol nebulizer when necessary  DVT ppx: on IV  Heparin      Code Status: Full code Family Communication: None at bed side.      Disposition Plan: Admit to inpatient   Date of Service 12/15/2014    Lorretta Harp [facility] Pager [phone]  If 7PM-7AM, please contact night-coverage [email or web address] Password [email or web address] 12/15/2014, 3:16 AM

## 2014-12-15 ENCOUNTER — Encounter (HOSPITAL_COMMUNITY): Payer: Self-pay | Admitting: Internal Medicine

## 2014-12-15 DIAGNOSIS — I2699 Other pulmonary embolism without acute cor pulmonale: Secondary | ICD-10-CM

## 2014-12-15 DIAGNOSIS — Z8673 Personal history of transient ischemic attack (TIA), and cerebral infarction without residual deficits: Secondary | ICD-10-CM

## 2014-12-15 DIAGNOSIS — I359 Nonrheumatic aortic valve disorder, unspecified: Secondary | ICD-10-CM

## 2014-12-15 DIAGNOSIS — I824Z1 Acute embolism and thrombosis of unspecified deep veins of right distal lower extremity: Secondary | ICD-10-CM

## 2014-12-15 LAB — CBC WITH DIFFERENTIAL/PLATELET
Basophils Absolute: 0 10*3/uL (ref 0.0–0.1)
Basophils Relative: 0 % (ref 0–1)
EOS ABS: 0 10*3/uL (ref 0.0–0.7)
Eosinophils Relative: 0 % (ref 0–5)
HCT: 32.1 % — ABNORMAL LOW (ref 36.0–46.0)
Hemoglobin: 10.3 g/dL — ABNORMAL LOW (ref 12.0–15.0)
LYMPHS ABS: 0.6 10*3/uL — AB (ref 0.7–4.0)
LYMPHS PCT: 8 % — AB (ref 12–46)
MCH: 30.8 pg (ref 26.0–34.0)
MCHC: 32.1 g/dL (ref 30.0–36.0)
MCV: 96.1 fL (ref 78.0–100.0)
MONO ABS: 0.3 10*3/uL (ref 0.1–1.0)
Monocytes Relative: 4 % (ref 3–12)
Neutro Abs: 7 10*3/uL (ref 1.7–7.7)
Neutrophils Relative %: 88 % — ABNORMAL HIGH (ref 43–77)
Platelets: 332 10*3/uL (ref 150–400)
RBC: 3.34 MIL/uL — AB (ref 3.87–5.11)
RDW: 16.9 % — AB (ref 11.5–15.5)
WBC: 8 10*3/uL (ref 4.0–10.5)

## 2014-12-15 LAB — CBC
HEMATOCRIT: 34.1 % — AB (ref 36.0–46.0)
Hemoglobin: 10.8 g/dL — ABNORMAL LOW (ref 12.0–15.0)
MCH: 30.7 pg (ref 26.0–34.0)
MCHC: 31.7 g/dL (ref 30.0–36.0)
MCV: 96.9 fL (ref 78.0–100.0)
PLATELETS: 339 10*3/uL (ref 150–400)
RBC: 3.52 MIL/uL — AB (ref 3.87–5.11)
RDW: 17 % — ABNORMAL HIGH (ref 11.5–15.5)
WBC: 10.8 10*3/uL — AB (ref 4.0–10.5)

## 2014-12-15 LAB — BASIC METABOLIC PANEL
Anion gap: 8 (ref 5–15)
BUN: 12 mg/dL (ref 6–23)
CO2: 31 mmol/L (ref 19–32)
Calcium: 8 mg/dL — ABNORMAL LOW (ref 8.4–10.5)
Chloride: 102 mEq/L (ref 96–112)
Creatinine, Ser: 0.75 mg/dL (ref 0.50–1.10)
GFR calc Af Amer: 84 mL/min — ABNORMAL LOW (ref >90)
GFR calc non Af Amer: 73 mL/min — ABNORMAL LOW (ref >90)
Glucose, Bld: 169 mg/dL — ABNORMAL HIGH (ref 70–99)
POTASSIUM: 3.1 mmol/L — AB (ref 3.5–5.1)
Sodium: 141 mmol/L (ref 135–145)

## 2014-12-15 LAB — TROPONIN I
Troponin I: 0.03 ng/mL (ref <0.031)
Troponin I: 0.03 ng/mL (ref <0.031)

## 2014-12-15 LAB — GLUCOSE, CAPILLARY
GLUCOSE-CAPILLARY: 165 mg/dL — AB (ref 70–99)
GLUCOSE-CAPILLARY: 121 mg/dL — AB (ref 70–99)
Glucose-Capillary: 152 mg/dL — ABNORMAL HIGH (ref 70–99)
Glucose-Capillary: 130 mg/dL — ABNORMAL HIGH (ref 70–99)

## 2014-12-15 LAB — HEMOGLOBIN A1C
Hgb A1c MFr Bld: 6.4 % — ABNORMAL HIGH (ref <5.7)
MEAN PLASMA GLUCOSE: 137 mg/dL — AB (ref <117)

## 2014-12-15 LAB — MRSA PCR SCREENING: MRSA by PCR: NEGATIVE

## 2014-12-15 LAB — PROTIME-INR
INR: 1.12 (ref 0.00–1.49)
Prothrombin Time: 14.6 seconds (ref 11.6–15.2)

## 2014-12-15 LAB — HEPARIN LEVEL (UNFRACTIONATED): Heparin Unfractionated: 0.34 IU/mL (ref 0.30–0.70)

## 2014-12-15 MED ORDER — INSULIN ASPART 100 UNIT/ML ~~LOC~~ SOLN
0.0000 [IU] | Freq: Three times a day (TID) | SUBCUTANEOUS | Status: DC
  Administered 2014-12-15: 2 [IU] via SUBCUTANEOUS
  Administered 2014-12-15 (×2): 3 [IU] via SUBCUTANEOUS
  Administered 2014-12-16: 2 [IU] via SUBCUTANEOUS

## 2014-12-15 MED ORDER — POTASSIUM CHLORIDE CRYS ER 20 MEQ PO TBCR
40.0000 meq | EXTENDED_RELEASE_TABLET | Freq: Two times a day (BID) | ORAL | Status: AC
  Administered 2014-12-15 (×2): 40 meq via ORAL
  Filled 2014-12-15 (×3): qty 2

## 2014-12-15 MED ORDER — HEPARIN (PORCINE) IN NACL 100-0.45 UNIT/ML-% IJ SOLN
1050.0000 [IU]/h | INTRAMUSCULAR | Status: DC
  Administered 2014-12-15 – 2014-12-16 (×2): 1050 [IU]/h via INTRAVENOUS
  Filled 2014-12-15 (×2): qty 250

## 2014-12-15 NOTE — Progress Notes (Signed)
ANTICOAGULATION CONSULT NOTE - Initial Consult  Pharmacy Consult for Heparin Indication: pulmonary embolus  Allergies  Allergen Reactions  . Arlice ColtMustard [Allyl Isothiocyanate] Hives  . Zestril [Lisinopril] Cough  . Zithromax [Azithromycin] Other (See Comments)    dizziness  . Codeine Nausea And Vomiting and Rash  . Novocain [Procaine] Rash    Patient Measurements: Height: 5\' 3"  (160 cm) Weight: 189 lb 13.1 oz (86.1 kg) IBW/kg (Calculated) : 52.4 Heparin Dosing Weight:   Vital Signs: Temp: 97.6 F (36.4 C) (01/08 0443) Temp Source: Oral (01/08 0000) BP: 127/71 mmHg (01/08 0400) Pulse Rate: 84 (01/08 0400)  Labs:  Recent Labs  12/15/14 0100 12/15/14 0520  HGB 10.3* 10.8*  HCT 32.1* 34.1*  PLT 332 339  LABPROT 14.6  --   INR 1.12  --   TROPONINI 0.03  --     CrCl cannot be calculated (Patient has no serum creatinine result on file.).   Medical History: Past Medical History  Diagnosis Date  . Hypertension   . Hyperlipidemia   . GERD (gastroesophageal reflux disease)   . Coronary artery disease   . History of stroke   . COPD (chronic obstructive pulmonary disease)   . Diabetes mellitus     Medications:  Infusions:  . sodium chloride 50 mL/hr at 12/15/14 0000  . heparin      Assessment: Patient with bilateral PE.    Goal of Therapy:  Heparin level 0.3 - 0.7  Monitor platelets by anticoagulation protocol: Yes   Plan:  Heparin drip at 1050 units/hr Heparin level at 1600  [address]Grimsley Jr, SaranacJulian Crowford 12/15/2014,6:39 AM

## 2014-12-15 NOTE — Progress Notes (Signed)
PT Cancellation Note  Patient Details Name: [place] BingLillie M Pica MRN: [id number] DOB: 03/30/1923   Cancelled Treatment:    Reason Eval/Treat Not Completed: Medical issues which prohibited therapy. Pt currently on bedrest and just starting IV Hep.Will hold PT today and check back another day. Thanks.    Rebeca AlertJannie Antoni Stefan, MPT Pager: [phone]

## 2014-12-15 NOTE — Progress Notes (Signed)
VASCULAR LAB PRELIMINARY  PRELIMINARY  PRELIMINARY  PRELIMINARY  Bilateral lower extremity venous duplex  completed.    Preliminary report:  Right:  Non occlusive DVT noted in the CFV.  No evidence of superficial thrombosis.  No Baker's cyst.  Left:  No evidence of DVT, superficial thrombosis, or Baker's cyst.  Ellizabeth Dacruz, RVT 12/15/2014, 9:32 AM

## 2014-12-15 NOTE — Progress Notes (Signed)
  Echocardiogram 2D Echocardiogram has been performed.  Arvil ChacoFoster, Yoni Lobos 12/15/2014, 10:04 AM

## 2014-12-15 NOTE — Progress Notes (Addendum)
[facility] PROGRESS NOTE  [place] [name] OZH:[id number] DOB: 10/30/1923 DOA: 12/14/2014 PCP: No primary care provider on file.  Assessment/Plan: 1. Bilateral acute PE with right LE DVT 1. On therapeutic heparin gtt 2. LE dopplers pos for non-occlusive R sided DVT, which is likely source of PE 3. Currently stable on min O2 support 4. Cont to monitor 2. HTN 1. Stable, controlled 2. Cont current regimen 3. DM 1. On SSI coverage 2. Cont diabetic diet 4. CAD 1. Stable 2. No chest pains. No active SOB while at rest 3. Cont home regimen 5. HLD 1. Cont statin 6. COPD 1. Stable 2. No wheezing 3. On min O2 support  Code Status: Full Family Communication: Pt in room (indicate person spoken with, relationship, and if by phone, the number) Disposition Plan: Pending   Consultants:    Procedures:    Antibiotics:   (indicate start date, and stop date if known)  HPI/Subjective: No complaints. Reports feeling well.  Objective: Filed Vitals:   12/15/14 0400 12/15/14 0443 12/15/14 0600 12/15/14 0800  BP: 127/71  144/68   Pulse: 84  88   Temp:  97.6 F (36.4 C)  97.8 F (36.6 C)  TempSrc:    Oral  Resp: 15  18   Height:      Weight:  86.1 kg (189 lb 13.1 oz)    SpO2: 98%  99%     Intake/Output Summary (Last 24 hours) at 12/15/14 1444 Last data filed at 12/15/14 0753  Gross per 24 hour  Intake   10.5 ml  Output      0 ml  Net   10.5 ml   Filed Weights   12/14/14 2204 12/15/14 0443  Weight: 84.4 kg (186 lb 1.1 oz) 86.1 kg (189 lb 13.1 oz)    Exam:   General:  Awake, in nad  Cardiovascular: regular, s1, s2  Respiratory: normal resp effort, no wheezing  Abdomen: soft,nondistended  Musculoskeletal: perfused, no clubbing   Data Reviewed: Basic Metabolic Panel:  Recent Labs Lab 12/15/14 0520  NA 141  Jefferson 3.1*  CL 102  CO2 31  GLUCOSE 169*  BUN 12  CREATININE 0.75  CALCIUM 8.0*   Liver Function Tests: No results for input(s): AST, ALT,  ALKPHOS, BILITOT, PROT, ALBUMIN in the last 168 hours. No results for input(s): LIPASE, AMYLASE in the last 168 hours. No results for input(s): AMMONIA in the last 168 hours. CBC:  Recent Labs Lab 12/15/14 0100 12/15/14 0520  WBC 8.0 10.8*  NEUTROABS 7.0  --   HGB 10.3* 10.8*  HCT 32.1* 34.1*  MCV 96.1 96.9  PLT 332 339   Cardiac Enzymes:  Recent Labs Lab 12/15/14 0100 12/15/14 0520 12/15/14 1225  TROPONINI 0.03 0.03 <0.03   BNP (last 3 results) No results for input(s): PROBNP in the last 8760 hours. CBG:  Recent Labs Lab 12/15/14 0839 12/15/14 1307  GLUCAP 152* 165*    Recent Results (from the past 240 hour(s))  MRSA PCR Screening     Status: None   Collection Time: 12/14/14 10:02 PM  Result Value Ref Range Status   MRSA by PCR NEGATIVE NEGATIVE Final    Comment:        The GeneXpert MRSA Assay (FDA approved for NASAL specimens only), is one component of a comprehensive MRSA colonization surveillance program. It is not intended to diagnose MRSA infection nor to guide or monitor treatment for MRSA infections.      Studies: No results found.  Scheduled Meds: .  amLODipine  10 mg Oral Daily  . aspirin  81 mg Oral Daily  . atorvastatin  20 mg Oral q1800  . calcium-vitamin D  1 tablet Oral Daily  . cholecalciferol  1,000 Units Oral Daily  . fluticasone  1-2 spray Each Nare Daily  . insulin aspart  0-15 Units Subcutaneous TID WC  . irbesartan  300 mg Oral Daily  . metoprolol succinate  25 mg Oral Daily  . montelukast  10 mg Oral QHS  . pantoprazole  40 mg Oral Daily  . potassium chloride  40 mEq Oral BID  . sodium chloride  3 mL Intravenous Q12H   Continuous Infusions: . sodium chloride 50 mL/hr at 12/15/14 0000  . heparin 1,050 Units/hr (12/15/14 0753)    Principal Problem:   Acute pulmonary embolism Active Problems:   HTN (hypertension)   HLD (hyperlipidemia)   GERD (gastroesophageal reflux disease)   CAD (coronary artery disease)    History of stroke   COPD exacerbation   PE (pulmonary embolism)  Time spent:  Heidi Jefferson  [facility] Pager [phone]. If 7PM-7AM, please contact night-coverage at [email or web address], password [facility] 12/15/2014, 2:44 PM  LOS: 1 day

## 2014-12-15 NOTE — Progress Notes (Signed)
ANTICOAGULATION CONSULT NOTE - Follow Up Consult  Pharmacy Consult for Heparin Indication: Acute PE and DVT  Allergies  Allergen Reactions  . Arlice ColtMustard [Allyl Isothiocyanate] Hives  . Zestril [Lisinopril] Cough  . Zithromax [Azithromycin] Other (See Comments)    dizziness  . Codeine Nausea And Vomiting and Rash  . Novocain [Procaine] Rash    Patient Measurements: Height: 5\' 3"  (160 cm) Weight: 189 lb 13.1 oz (86.1 kg) IBW/kg (Calculated) : 52.4 Heparin Dosing Weight: 71.6 kg  Vital Signs: Temp: 97.8 F (36.6 C) (01/08 0800) Temp Source: Oral (01/08 0800) BP: 109/62 mmHg (01/08 1600) Pulse Rate: 84 (01/08 1600)  Labs:  Recent Labs  12/15/14 0100 12/15/14 0520 12/15/14 1225 12/15/14 1632  HGB 10.3* 10.8*  --   --   HCT 32.1* 34.1*  --   --   PLT 332 339  --   --   LABPROT 14.6  --   --   --   INR 1.12  --   --   --   HEPARINUNFRC  --   --   --  0.34  CREATININE  --  0.75  --   --   TROPONINI 0.03 0.03 <0.03  --     Estimated Creatinine Clearance: 49.6 mL/min (by C-G formula based on Cr of 0.75).   Medications:  Scheduled:  . amLODipine  10 mg Oral Daily  . aspirin  81 mg Oral Daily  . atorvastatin  20 mg Oral q1800  . calcium-vitamin D  1 tablet Oral Daily  . cholecalciferol  1,000 Units Oral Daily  . fluticasone  1-2 spray Each Nare Daily  . insulin aspart  0-15 Units Subcutaneous TID WC  . irbesartan  300 mg Oral Daily  . metoprolol succinate  25 mg Oral Daily  . montelukast  10 mg Oral QHS  . pantoprazole  40 mg Oral Daily  . potassium chloride  40 mEq Oral BID  . sodium chloride  3 mL Intravenous Q12H   Infusions:  . sodium chloride 50 mL/hr at 12/15/14 0000  . heparin 1,050 Units/hr (12/15/14 0753)   PRN: albuterol, HYDROmorphone (DILAUDID) injection, traMADol  Assessment: [age] yoF [facility] where [facility]A showed bilateral large PE with heart strain. She received one dose of Lovenox 90mg  given at ~3pm ([facility] Her PMH includes HTN, HLD, GERD, CAD, stroke, COPD, DM, and recent pneumonia [facility], then transferred to SNF for rehab. She began having SOB at rehab about a week ago, but [facility]. Pharmacy is consulted to dose Heparin drip, start on 1/7.   First heparin level is at low-end of therapeutic range (0.34) on 1050 units/hr  CBC: Hgb 10.3 (low but stable), Plts WNL  No bleeding reported per RN  Goal of Therapy:  Heparin level 0.3-0.7 units/ml Monitor platelets by anticoagulation protocol: Yes   Plan:   Continue heparin 1050 units/hr  Recheck heparin level in 8 hours to verify therapeutic  Loralee PacasErin Quetzal Meany, PharmD, BCPS Pager: [phone] 12/15/2014,5:36 PM

## 2014-12-15 NOTE — Progress Notes (Signed)
OT Cancellation Note  Patient Details Name: [place] BingLillie M Potash MRN: [id number] DOB: 04/07/1923   Cancelled Treatment:    Reason Eval/Treat Not Completed: Other (comment).  Pt is on heparin:  Need to wait for her to be therapeutic.  Also, she is on strict bedrest at this time.  Jeffifer Rabold 12/15/2014, 8:12 AM  Marica OtterMaryellen Earlyn Sylvan, OTR/L [phone] 12/15/2014

## 2014-12-16 DIAGNOSIS — K219 Gastro-esophageal reflux disease without esophagitis: Secondary | ICD-10-CM

## 2014-12-16 LAB — CBC
HEMATOCRIT: 33.8 % — AB (ref 36.0–46.0)
Hemoglobin: 10.2 g/dL — ABNORMAL LOW (ref 12.0–15.0)
MCH: 30.4 pg (ref 26.0–34.0)
MCHC: 30.2 g/dL (ref 30.0–36.0)
MCV: 100.6 fL — ABNORMAL HIGH (ref 78.0–100.0)
Platelets: 394 10*3/uL (ref 150–400)
RBC: 3.36 MIL/uL — AB (ref 3.87–5.11)
RDW: 17.9 % — ABNORMAL HIGH (ref 11.5–15.5)
WBC: 10.3 10*3/uL (ref 4.0–10.5)

## 2014-12-16 LAB — HEPARIN LEVEL (UNFRACTIONATED)
HEPARIN UNFRACTIONATED: 0.55 [IU]/mL (ref 0.30–0.70)
Heparin Unfractionated: 0.51 IU/mL (ref 0.30–0.70)

## 2014-12-16 LAB — GLUCOSE, CAPILLARY
GLUCOSE-CAPILLARY: 95 mg/dL (ref 70–99)
Glucose-Capillary: 137 mg/dL — ABNORMAL HIGH (ref 70–99)

## 2014-12-16 MED ORDER — RIVAROXABAN 20 MG PO TABS: 20.0000 mg | ORAL_TABLET | Freq: Every day | ORAL | Status: DC

## 2014-12-16 MED ORDER — RIVAROXABAN 15 MG PO TABS
15.0000 mg | ORAL_TABLET | Freq: Once | ORAL | Status: AC
Start: 2014-12-16 — End: 2014-12-16
  Administered 2014-12-16: 15 mg via ORAL
  Filled 2014-12-16: qty 1

## 2014-12-16 MED ORDER — RIVAROXABAN 15 MG PO TABS
15.0000 mg | ORAL_TABLET | Freq: Two times a day (BID) | ORAL | Status: DC
  Filled 2014-12-16: qty 1

## 2014-12-16 MED ORDER — RIVAROXABAN (XARELTO) VTE STARTER PACK (15 & 20 MG): ORAL_TABLET | ORAL | Status: DC

## 2014-12-16 MED ORDER — RIVAROXABAN (XARELTO) EDUCATION KIT FOR DVT/PE PATIENTS
PACK | Freq: Once | Status: AC
  Administered 2014-12-16: 15:00:00
  Filled 2014-12-16: qty 1

## 2014-12-16 NOTE — Clinical Social Work Note (Signed)
  CSW received a call from dr. Betsy Coderhow asking for some help with pt discharge  MD asked if pt could be transported home in ambulance due to blood clots in both lungs and blood clot in right leg  CSW prepared discharge paperwork, provided it to RN and called transport for pt  CSW signing off  .Elray Bubaegina Arafat Cocuzza, LCSW [facility] Clinical Social Worker - Weekend Coverage cell #: [phone]

## 2014-12-16 NOTE — Evaluation (Signed)
Physical Therapy Evaluation Patient Details Name: Heidi Jefferson MRN: [id number] DOB: Jul 03, 1923 Today's Date: 12/16/2014   History of Present Illness   Heidi Jefferson is a 81 y.o. female with past medical history of hypertension, hyperlipidemia, GERD, coronary artery disease, history of stroke, COPD, diabetes mellitus, who presented with shortness of breath and chest tightness.Dx of B PE and R DVT.  Clinical Impression  **Pt admitted with above diagnosis. Pt currently with functional limitations due to the deficits listed below (see PT Problem List). ** Pt will benefit from skilled PT to increase their independence and safety with mobility to allow discharge to the venue listed below.    Pt ambulated 77' with RW and supervision. SaO2 97% on RA walking, HR 112 walking, 2/4 dyspnea. Good progress expected. Expect pt can return home where she has 24* assist from family.  *    Follow Up Recommendations No PT follow up    Equipment Recommendations  None recommended by PT    Recommendations for Other Services       Precautions / Restrictions Precautions Precautions: Fall Precaution Comments: monitor HR Restrictions Weight Bearing Restrictions: No      Mobility  Bed Mobility Overal bed mobility: Modified Independent             General bed mobility comments: with rail  Transfers Overall transfer level: Needs assistance Equipment used: Rolling walker (2 wheeled) Transfers: Sit to/from Stand Sit to Stand: Min guard         General transfer comment: cues hand placement  Ambulation/Gait Ambulation/Gait assistance: Supervision Ambulation Distance (Feet): 70 Feet Assistive device: Rolling walker (2 wheeled) Gait Pattern/deviations: Step-through pattern   Gait velocity interpretation: Below normal speed for age/gender General Gait Details: steady with RW, 2/4 dyspnea, HR 112 walking, SaO2 97% on RA walking  Stairs            Wheelchair Mobility    Modified  Rankin (Stroke Patients Only)       Balance Overall balance assessment: Modified Independent                                           Pertinent Vitals/Pain Pain Assessment: No/denies pain    Home Living Family/patient expects to be discharged to:: Private residence Living Arrangements: Children Available Help at Discharge: Family;Available 24 hours/day Type of Home: House Home Access: Stairs to enter Entrance Stairs-Rails: Doctor, general practice of Steps: 5 Home Layout: One level Home Equipment: Walker - 2 wheels;Shower seat      Prior Function Level of Independence: Independent with assistive device(s)         Comments: independent with RW, independent ADLs     Hand Dominance        Extremity/Trunk Assessment   Upper Extremity Assessment: Overall WFL for tasks assessed           Lower Extremity Assessment: Overall WFL for tasks assessed      Cervical / Trunk Assessment: Normal  Communication   Communication: No difficulties  Cognition Arousal/Alertness: Awake/alert Behavior During Therapy: WFL for tasks assessed/performed Overall Cognitive Status: Within Functional Limits for tasks assessed                      General Comments      Exercises        Assessment/Plan    PT Assessment Patient needs continued PT services  PT Diagnosis Generalized weakness   PT Problem List Decreased activity tolerance;Cardiopulmonary status limiting activity;Decreased mobility  PT Treatment Interventions Gait training;Stair training;Functional mobility training;Patient/family education;Therapeutic exercise   PT Goals (Current goals can be found in the Care Plan section) Acute Rehab PT Goals Patient Stated Goal: return home PT Goal Formulation: With patient Time For Goal Achievement: 12/30/14 Potential to Achieve Goals: Good    Frequency Min 3X/week   Barriers to discharge        Co-evaluation                End of Session Equipment Utilized During Treatment: Gait belt Activity Tolerance: Patient tolerated treatment well Patient left: in chair;with call bell/phone within reach Nurse Communication: Mobility status         Time: 1313-1330 PT Time Calculation (min) (ACUTE ONLY): 17 min   Charges:   PT Evaluation $Initial PT Evaluation Tier I: 1 Procedure PT Treatments $Gait Training: 8-22 mins   PT G Codes:        Tamala SerUhlenberg, Vivi Piccirilli Kistler 12/16/2014, 1:34 PM [phone]

## 2014-12-16 NOTE — Discharge Instructions (Signed)
Pulmonary Embolism A pulmonary (lung) embolism (PE) is a blood clot that has traveled to the lung and results in a blockage of blood flow in the affected lung. Most clots come from deep veins in the legs or pelvis. PE is a dangerous and potentially life-threatening condition that can be treated if identified. CAUSES Blood clots form in a vein for different reasons. Usually several things cause blood clots. They include:  The flow of blood slows down.  The inside of the vein is damaged in some way.  The person has a condition that makes the blood clot more easily. RISK FACTORS Some people are more likely than others to develop PE. Risk factors include:   Smoking.  Being overweight (obese).  Sitting or lying still for a long time. This includes long-distance travel, paralysis, or recovery from an illness or surgery. Other factors that increase risk are:   Older age, especially over 72 years of age.  Having a family history of blood clots or if you have already had a blood clot.  Having major or lengthy surgery. This is especially true for surgery on the hip, knee, or belly (abdomen). Hip surgery is particularly high risk.  Having a long, thin tube (catheter) placed inside a vein during a medical procedure.  Breaking a hip or leg.  Having cancer or cancer treatment.  Medicines containing the female hormone estrogen. This includes birth control pills and hormone replacement therapy.  Other circulation or heart problems.  Pregnancy and childbirth.  Hormone changes make the blood clot more easily during pregnancy.  The fetus puts pressure on the veins of the pelvis.  There is a risk of injury to veins during delivery or a caesarean delivery. The risk is highest just after childbirth.  PREVENTION   Exercise the legs regularly. Take a brisk 30 minute walk every day.  Maintain a weight that is appropriate for your height.  Avoid sitting or lying in bed for long periods of  time without moving your legs.  Women, particularly those over the age of 28 years, should consider the risks and benefits of taking estrogen medicines, including birth control pills.  Do not smoke, especially if you take estrogen medicines.  Long-distance travel can increase your risk. You should exercise your legs by walking or pumping the muscles every hour.  Many of the risk factors above relate to situations that exist with hospitalization, either for illness, injury, or elective surgery. Prevention may include medical and nonmedical measures.   Your health care provider will assess you for the need for venous thromboembolism prevention when you are admitted to the hospital. If you are having surgery, your surgeon will assess you the day of or day after surgery.  SYMPTOMS  The symptoms of a PE usually start suddenly and include:  Shortness of breath.  Coughing.  Coughing up blood or blood-tinged mucus.  Chest pain. Pain is often worse with deep breaths.  Rapid heartbeat. DIAGNOSIS  If a PE is suspected, your health care provider will take a medical history and perform a physical exam. Other tests that may be required include:  Blood tests, such as studies of the clotting properties of your blood.  Imaging tests, such as ultrasound, CT, MRI, and other tests to see if you have clots in your legs or lungs.  An electrocardiogram. This can look for heart strain from blood clots in the lungs. TREATMENT   The most common treatment for a PE is blood thinning (anticoagulant) medicine, which reduces  the blood's tendency to clot. Anticoagulants can stop new blood clots from forming and old clots from growing. They cannot dissolve existing clots. Your body does this by itself over time. Anticoagulants can be given by mouth, through an intravenous (IV) tube, or by injection. Your health care provider will determine the best program for you.  Less commonly, clot-dissolving medicines  (thrombolytics) are used to dissolve a PE. They carry a high risk of bleeding, so they are used mainly in severe cases.  Very rarely, a blood clot in the leg needs to be removed surgically.  If you are unable to take anticoagulants, your health care provider may arrange for you to have a filter placed in a main vein in your abdomen. This filter prevents clots from traveling to your lungs. HOME CARE INSTRUCTIONS   Take all medicines as directed by your health care provider.  Learn as much as you can about DVT.  Wear a medical alert bracelet or carry a medical alert card.  Ask your health care provider how soon you can go back to normal activities. It is important to stay active to prevent blood clots. If you are on anticoagulant medicine, avoid contact sports.  It is very important to exercise. This is especially important while traveling, sitting, or standing for long periods of time. Exercise your legs by walking or by tightening and relaxing your leg muscles regularly. Take frequent walks.  You may need to wear compression stockings. These are tight elastic stockings that apply pressure to the lower legs. This pressure can help keep the blood in the legs from clotting. Taking Warfarin Warfarin is a daily medicine that is taken by mouth. Your health care provider will advise you on the length of treatment (usually 3-6 months, sometimes lifelong). If you take warfarin:  Understand how to take warfarin and foods that can affect how warfarin works in Veterinary surgeon.  Too much and too little warfarin are both dangerous. Too much warfarin increases the risk of bleeding. Too little warfarin continues to allow the risk for blood clots. Warfarin and Regular Blood Testing While taking warfarin, you will need to have regular blood tests to measure your blood clotting time. These blood tests usually include both the prothrombin time (PT) and international normalized ratio (INR) tests. The PT and INR  results allow your health care provider to adjust your dose of warfarin. It is very important that you have your PT and INR tested as often as directed by your health care provider.  Warfarin and Your Diet Avoid major changes in your diet, or notify your health care provider before changing your diet. Arrange a visit with a registered dietitian to answer your questions. Many foods, especially foods high in vitamin K, can interfere with warfarin and affect the PT and INR results. You should eat a consistent amount of foods high in vitamin K. Foods high in vitamin K include:   Spinach, kale, broccoli, cabbage, collard and turnip greens, Brussels sprouts, peas, cauliflower, seaweed, and parsley.  Beef and pork liver.  Green tea.  Soybean oil. Warfarin with Other Medicines Many medicines can interfere with warfarin and affect the PT and INR results. You must:  Tell your health care provider about any and all medicines, vitamins, and supplements you take, including aspirin and other over-the-counter anti-inflammatory medicines. Be especially cautious with aspirin and anti-inflammatory medicines. Ask your health care provider before taking these.  Do not take or discontinue any prescribed or over-the-counter medicine except on the advice  of your health care provider or pharmacist. Warfarin Side Effects Warfarin can have side effects, such as easy bruising and difficulty stopping bleeding. Ask your health care provider or pharmacist about other side effects of warfarin. You will need to:  Hold pressure over cuts for longer than usual.  Notify your dentist and other health care providers that you are taking warfarin before you undergo any procedures where bleeding may occur. Warfarin with Alcohol and Tobacco   Drinking alcohol frequently can increase the effect of warfarin, leading to excess bleeding. It is best to avoid alcoholic drinks or consume only very small amounts while taking warfarin.  Notify your health care provider if you change your alcohol intake.  Do not use any tobacco products including cigarettes, chewing tobacco, or electronic cigarettes. If you smoke, quit. Ask your health care provider for help with quitting smoking. Alternative Medicines to Warfarin: Factor Xa Inhibitor Medicines  These blood thinning medicines are taken by mouth, usually for several weeks or longer. It is important to take the medicine every single day, at the same time each day.  There are no regular blood tests required when using these medicines.  There are fewer food and drug interactions than with warfarin.  The side effects of this class of medicine is similar to that of warfarin, including excessive bruising or bleeding. Ask your health care provider or pharmacist about other potential side effects. SEEK MEDICAL CARE IF:   You notice a rapid heartbeat.  You feel weaker or more tired than usual.  You feel faint.  You notice increased bruising.  Your symptoms are not getting better in the time expected.  You are having side effects of medicine. SEEK IMMEDIATE MEDICAL CARE IF:   You have chest pain.  You have trouble breathing.  You have new or increased swelling or pain in one leg.  You cough up blood.  You notice blood in vomit, in a bowel movement, or in urine.  You have a fever. Symptoms of PE may represent a serious problem that is an emergency. Do not wait to see if the symptoms will go away. Get medical help right away. Call your local emergency services (911 in the [place]). Do not drive yourself to the hospital. Document Released: 11/21/2000 Document Revised: 04/10/2014 Document Reviewed: 12/05/2013 [facility] Patient Information 2015 [employer], [place]. This information is not intended to replace advice given to you by your health care provider. Make sure you discuss any questions you have with your health care provider.   Venous Thromboembolism,  Prevention A venous thromboembolism is a blood clot that forms in a vein. A blood clot in a deep vein is called a deep venous thrombosis (DVT). A blood clot in the lungs is called a pulmonary embolism (PE). Blood clots are dangerous and can cause death. Blood clots can form in the:  Lungs.  Legs.  Arms. CAUSES  A blood clot can form in a vein from different conditions. A blood clot can develop due to:  Blood flow within a vein that is sluggish or very slow.  Medical conditions that make the blood clot easily.  Vein damage. RISK FACTORS Risk factors can increase your risk of developing a blood clot. Risk factors can include:  Smoking.  Obesity.  Age.  Immobility or sedentary lifestyle.  Sitting or standing for long periods of time.  Chronic or long-term bedrest.  Medical or past history of blood clots.  Family history of blood clots.  Hip, leg, or pelvis injury  or trauma.  Major surgery, especially surgery on the hip, knee, or abdomen.  Pregnancy and childbirth.  Birth control pills and hormone replacement therapy.  Medical conditions such as  Peripheral vascular disease (PVD).  Diabetes.  Cancer. SYMPTOMS  Symptoms of VTE can depend on where the clot is located and if the clot breaks off and travels to another organ. Sometimes, there may be no symptoms.   DVT symptoms can include:  Swelling of the leg or arm, especially on one side.  Warmth and redness of the leg or arm, especially on one side.  Pain in an arm or leg. Leg pain may be more noticeable or worse when standing or walking.  PE symptoms can include:  Shortness of breath.  Coughing.  Coughing up blood or blood-tinged mucus (hemoptysis).  Chest pain or chest pain with deep breaths (pleuritic chest pain).  Apprehension, anxiety, or a feeling of impending doom.  Rapid heartbeat. PREVENTION  Exercise regularly. Take a brisk 30 minute walk every day. Staying active and moving around can  help prevent blood clots.  Avoid sitting or lying in bed for long periods of time. Change your position often, especially during a long trip.  Women, especially those over the age of 23, should consider the risks and benefits of taking estrogen medicines. This includes birth control pills and hormone replacement therapy.  Do not smoke, especially if you take estrogen medicines. If you smoke, talk to your caregiver on how to quit.  Eat plenty of fruits and vegetables. Ask your caregiver or dietitian if there are foods you should avoid.  Maintain a weight as suggested by your caregiver.  Wear loose-fitting clothing. Avoid constrictive or tight clothing around your legs or waist.  Try not to bump or injure your legs. Avoid crossing your legs when you are sitting.  Do not use pillows under your knees unless told by your caregiver.  Take all medicines that your caregiver prescribes you.  Wear special stockings (compression stockings or TED hose) if your caregiver prescribes them.  Wearing compression stockings (support hose) can make the leg veins more narrow. This increases blood flow in the legs and can help prevent blood clots.  It is important to wear compression stockings correctly. Do not let them bunch up when you are wearing them. TRAVEL Long distance travel can increase the risk of a blood clot. To prevent a blood clot when traveling:  You should exercise your legs by walking or by pumping your muscles every hour. To help prevent poor circulation on long trips, stand, stretch, and walk up and down the aisle of your airplane, train, or bus as often as possible to get the blood moving.  Do squats if you are able. If you are unable to do squats, raise your foot on the balls of your feet and tighten your lower leg muscles (particularly the calve muscles) while seated. Pointing (flexing and extending) your toes while tightening your calves while seated are also good exercises to do every  hour during long trips. They help increase blood flow and reduce risk of DVT.  Stay well hydrated. Drink water regularly when traveling, especially when you are sitting or immobile for long periods of time.  Use of drugs to prevent DVT during routine travel is not generally recommended. Before taking any drugs to reduce risk of DVT, consult your caregiver. SURGERY AND HOSPITALIZATION  People who are at high risk for a blood clot may be given a blood thinning medicine (anticoagulant) when  they are hospitalized even if they are not going to have surgery.  A long trip prior to surgery can increase the risk of a clot for patients undergoing hip and knee replacements. Talk to your caregiver about travel plans before your surgery.  After hip or knee surgery, your caregiver may give you anticoagulants to help prevent blood clots.  Anticoagulants may be given to people at high risk of developing thromboembolism, before, during, or sometimes after surgery, including people with clotting disorders or with a history of past thromboembolism. TRAVEL AFTER SURGERY  In orthopedic surgery, the cutting of bones prompts the body to increase clotting factors in the blood. Due to the size of the bones involved in hip and knee replacements, there is a higher risk of blood clotting than other orthopedic surgeries.  There is a risk of clotting for up to 4-6 weeks after surgery. Flying or traveling long distances can increase your risk of a clot. As a result, those who travel long distances may need additional preventive measures after their procedure.  Drink only non-alcoholic beverages during your flight, train, or car travel. Alcohol can dehydrate you and increase your risk of getting blood clots. SEEK IMMEDIATE MEDICAL CARE IF:   You develop chest pain.  You develop severe shortness of breath.  You have breathing problems after traveling.  You develop swelling or pain in the leg.  You begin to cough up  bloody mucus or phlegm (sputum).  You feel dizzy or faint. Document Released: 11/12/2009 Document Revised: 08/18/2012 Document Reviewed: 11/12/2009 [facility] Patient Information 2015 [place], [place]. This information is not intended to replace advice given to you by your health care provider. Make sure you discuss any questions you have with your health care provider.   Information on my medicine - XARELTO (rivaroxaban)  This medication education was reviewed with me or my healthcare representative as part of my discharge preparation.  The pharmacist that spoke with me during my hospital stay was:  Reece Packer, [facility]  WHY WAS Heidi Jefferson PRESCRIBED FOR YOU? Xarelto was prescribed to treat blood clots that may have been found in the veins of your legs (deep vein thrombosis) or in your lungs (pulmonary embolism) and to reduce the risk of them occurring again.  What do you need to know about Xarelto? The starting dose is one 15 mg tablet taken TWICE daily with food for the FIRST 21 DAYS then on 01/06/15,  the dose is changed to one 20 mg tablet taken ONCE A DAY with your evening meal.  DO NOT stop taking Xarelto without talking to the health care provider who prescribed the medication.  Refill your prescription for 20 mg tablets before you run out.  After discharge, you should have regular check-up appointments with your healthcare provider that is prescribing your Xarelto.  In the future your dose may need to be changed if your kidney function changes by a significant amount.  What do you do if you miss a dose? If you are taking Xarelto TWICE DAILY and you miss a dose, take it as soon as you remember. You may take two 15 mg tablets (total 30 mg) at the same time then resume your regularly scheduled 15 mg twice daily the next day.  If you are taking Xarelto ONCE DAILY and you miss a dose, take it as soon as you remember on the same day then continue your regularly scheduled once  daily regimen the next day. Do not take two doses of Xarelto at  the same time.   Important Safety Information Xarelto is a blood thinner medicine that can cause bleeding. You should call your healthcare provider right away if you experience any of the following: ? Bleeding from an injury or your nose that does not stop. ? Unusual colored urine (red or dark brown) or unusual colored stools (red or black). ? Unusual bruising for unknown reasons. ? A serious fall or if you hit your head (even if there is no bleeding).  Some medicines may interact with Xarelto and might increase your risk of bleeding while on Xarelto. To help avoid this, consult your healthcare provider or pharmacist prior to using any new prescription or non-prescription medications, including herbals, vitamins, non-steroidal anti-inflammatory drugs (NSAIDs) and supplements.  This website has more information on Xarelto: [email or web address]

## 2014-12-16 NOTE — Progress Notes (Signed)
Pt discharged to home. DC instructions given. Pt voiced concerns about incorrect address on file. Charge rn made aware and admitting contacted re same. Necessary corrections made by admitting clerk in ED. MD then made aware and asked to reprint prescription with corrected pt's information. Prescriptions reprinted. Pt made aware of all the changes. Left unit on stretcher pushed by ambulance personnel. Left in good condition. Information for xarelto to include pharmacy that has drug in [place] sent from pharmacy by Elijah Birkom, pharmacist. Information given to patient and discussed. No concerns voiced. Vwilliams,rn.

## 2014-12-16 NOTE — Care Management Note (Signed)
CARE MANAGEMENT NOTE 12/16/2014  Patient:  Heidi Jefferson,Heidi Jefferson   Account Number:  [id number]  Date Initiated:  12/16/2014  Documentation initiated by:  Antony HasteBENNETT,Murray Durrell HARRIS  Subjective/Objective Assessment:   sob and chest tightness     Action/Plan:   Anticipated DC Date:  12/16/2014   Anticipated DC Plan:  HOME/SELF CARE      DC Planning Services  CM consult  Medication Assistance      Choice offered to / List presented to:             Status of service:  Completed, signed off Medicare Important Message given?   (If response is "NO", the following Medicare IM given date fields will be blank) Date Medicare IM given:   Medicare IM given by:   Date Additional Medicare IM given:   Additional Medicare IM given by:    Discharge Disposition:  HOME/SELF CARE  Per UR Regulation:    If discussed at Long Length of Stay Meetings, dates discussed:    Comments:  12/16/14 1422 - CM spoke with patient. Provided her with a 30 day free trial Xarelto card and explained. Heidi Maidrystal Moksh Loomer RN BSN CCM [phone]

## 2014-12-16 NOTE — Progress Notes (Signed)
ANTICOAGULATION CONSULT NOTE - Follow Up Consult  Pharmacy Consult for Heparin Indication: Acute PE and DVT  Allergies  Allergen Reactions  . Arlice ColtMustard [Allyl Isothiocyanate] Hives  . Zestril [Lisinopril] Cough  . Zithromax [Azithromycin] Other (See Comments)    dizziness  . Codeine Nausea And Vomiting and Rash  . Novocain [Procaine] Rash    Patient Measurements: Height: 5\' 3"  (160 cm) Weight: 189 lb 13.1 oz (86.1 kg) IBW/kg (Calculated) : 52.4 Heparin Dosing Weight:   Vital Signs: Temp: 98.3 F (36.8 C) (01/08 2029) Temp Source: Oral (01/08 2029) BP: 109/74 mmHg (01/08 2029) Pulse Rate: 91 (01/08 2029)  Labs:  Recent Labs  12/15/14 0100 12/15/14 0520 12/15/14 1225 12/15/14 1632 12/16/14 0040  HGB 10.3* 10.8*  --   --   --   HCT 32.1* 34.1*  --   --   --   PLT 332 339  --   --   --   LABPROT 14.6  --   --   --   --   INR 1.12  --   --   --   --   HEPARINUNFRC  --   --   --  0.34 0.55  CREATININE  --  0.75  --   --   --   TROPONINI 0.03 0.03 <0.03  --   --     Estimated Creatinine Clearance: 49.6 mL/min (by C-G formula based on Cr of 0.75).   Medications:  Infusions:  . sodium chloride 50 mL/hr at 12/15/14 2214  . heparin 1,050 Units/hr (12/16/14 0043)    Assessment: Patient with 2nd heparin level at goal.  No issues with drip noted.  Goal of Therapy:  Heparin level 0.3-0.7 units/ml Monitor platelets by anticoagulation protocol: Yes   Plan:  Continue heparin drip at current rate Recheck level at 0900, then begin DHL 1/10  Darlina GuysGrimsley Jr, Jacquenette ShoneJulian Crowford 12/16/2014,3:21 AM

## 2014-12-16 NOTE — Progress Notes (Signed)
ANTICOAGULATION CONSULT NOTE - Follow Up Consult  Pharmacy Consult for Heparin transition to Xarelto for discharge Indication: pulmonary embolus and DVT  Allergies  Allergen Reactions  . [facility] Isothiocyanate] Hives  . Zestril [Lisinopril] Cough  . Zithromax [Azithromycin] Other (See Comments)    dizziness  . Codeine Nausea And Vomiting and Rash  . Novocain [Procaine] Rash    Patient Measurements: Height: _0  (160 cm) Weight: 189 lb 4.8 oz (85.866 kg) IBW/kg (Calculated) : 52.4 Heparin Dosing Weight: 71.6 kg  Vital Signs: Temp: 98 F (36.7 C) (01/09 1355) Temp Source: Oral (01/09 1355) BP: 115/61 mmHg (01/09 1355) Pulse Rate: 91 (01/09 1355)  Labs:  Recent Labs  12/15/14 0100 12/15/14 0520 12/15/14 1225 12/15/14 1632 12/16/14 0040 12/16/14 0539 12/16/14 0940  HGB 10.3* 10.8*  --   --   --  10.2*  --   HCT 32.1* 34.1*  --   --   --  33.8*  --   PLT 332 339  --   --   --  394  --   LABPROT 14.6  --   --   --   --   --   --   INR 1.12  --   --   --   --   --   --   HEPARINUNFRC  --   --   --  0.34 0.55  --  0.51  CREATININE  --  0.75  --   --   --   --   --   TROPONINI 0.03 0.03 <0.03  --   --   --   --     Estimated Creatinine Clearance: 49.5 mL/min (by C-G formula based on Cr of 0.75).   Medications:  Infusions:  . sodium chloride 50 mL/hr at 12/15/14 2214  . heparin 1,050 Units/hr (12/16/14 0043)    Assessment: 52 yoF presented 1/7 PM to [facility] where CTA showed bilateral large PE with heart strain. She received one dose of Lovenox 18m given at ~3pm ([facility] with [facility]. Her PMH includes HTN, HLD, GERD, CAD, stroke, COPD, DM, and [facility]. She began having SOB at rehab about a week ago, [facility] Venous Duplex also + DVT in right CFV.  Pharmacy is consulted to dose Heparin drip, start on 1/7.  Today,  12/16/2014:  Heparin level 0.51, therapeutic on heparin 10.5 ml/hr  CBC: Hgb 10.2 (low, but stable and near baseline), Plt 394  Rn reports no infusion problems, no bleeding or complications.   Goal of Therapy:  Heparin level 0.3-0.7 units/ml Monitor platelets by anticoagulation protocol: Yes   Plan:   DC heparin drip and give first dose of Xarelto at the same time.  Xarelto will be 158mbid x 21 days then 2055maily. Please prescribe the Xarelto starting pack.  Counsel patient and give a Xarelto discharge kit.  TomRomeo RabonharmD, pager [phone]/08/2015,2:23 PM.

## 2014-12-16 NOTE — Discharge Summary (Signed)
Physician Discharge Summary  Heidi BingLillie M Ervine JWJ:[id number] DOB: 03/25/1923 DOA: 12/14/2014  PCP: No primary care provider on file.  Admit date: 12/14/2014 Discharge date: 12/16/2014  Time spent: 30 minutes  Recommendations for Outpatient Follow-up:  1. Follow up with PCP in 1-2 weeks  Discharge Diagnoses:  Principal Problem:   Acute pulmonary embolism Active Problems:   HTN (hypertension)   HLD (hyperlipidemia)   GERD (gastroesophageal reflux disease)   CAD (coronary artery disease)   History of stroke   COPD exacerbation   PE (pulmonary embolism)  Discharge Condition: Stable  Diet recommendation: Diabetic  Filed Weights   12/14/14 2204 12/15/14 0443 12/16/14 0629  Weight: 84.4 kg (186 lb 1.1 oz) 86.1 kg (189 lb 13.1 oz) 85.866 kg (189 lb 4.8 oz)    History of present illness:  Please see admit h and p from 12/14/14 for details. Briefly, pt presents with acute SOB, found to have acute bilateral PE. The patient was admitted for work up for bilateral PE.  Hospital Course:  1. Bilateral acute PE with right LE DVT 1. On therapeutic heparin gtt 2. LE dopplers pos for non-occlusive R sided DVT, which is likely source of PE 3. Currently stable on min O2 support 4. The patient was seen by PT. Pt remained at 97% o2 sats on room air on ambulation. 5. The patient will be discharged home with Xarelto 6. Pt instructed to watch for signs of bleeding 2. HTN 1. Stable, controlled 2. Cont current regimen 3. DM 1. On SSI coverage 2. Cont diabetic diet 4. CAD 1. Stable 2. No chest pains. No active SOB while at rest 3. Cont home regimen 5. HLD 1. Cont statin 6. COPD 1. Stable 2. No wheezing 3. On min O2 support  Consultations:  none  Discharge Exam: Filed Vitals:   12/16/14 0629 12/16/14 1032 12/16/14 1331 12/16/14 1355  BP:  134/62  115/61  Pulse:  77 112 91  Temp:  98.4 F (36.9 C)  98 F (36.7 C)  TempSrc:  Oral  Oral  Resp:    18  Height:      Weight: 85.866 kg  (189 lb 4.8 oz)     SpO2:   97% 96%    General: Awake, in nad Cardiovascular: Regular, s1, s2 Respiratory: normal resp effort, no wheezing  Discharge Instructions     Medication List    TAKE these medications        amLODipine 10 MG tablet  Commonly known as:  NORVASC  Take 10 mg by mouth daily.     aspirin 81 MG chewable tablet  Chew 81 mg by mouth daily.     atorvastatin 20 MG tablet  Commonly known as:  LIPITOR  Take 20 mg by mouth daily.     CALTRATE 600+D 600-800 MG-UNIT Tabs  Generic drug:  Calcium Carb-Cholecalciferol  Take 1 tablet by mouth daily.     cholecalciferol 1000 UNITS tablet  Commonly known as:  VITAMIN D  Take 1,000 Units by mouth daily.     dexlansoprazole 60 MG capsule  Commonly known as:  DEXILANT  Take 60 mg by mouth daily.     fluticasone 50 MCG/ACT nasal spray  Commonly known as:  FLONASE  Place 1-2 sprays into both nostrils daily.     furosemide 40 MG tablet  Commonly known as:  LASIX  Take 40 mg by mouth daily.     ibandronate 150 MG tablet  Commonly known as:  BONIVA  Take 150 mg  by mouth every 30 (thirty) days. Take in the morning with a full glass of water, on an empty stomach, and do not take anything else by mouth or lie down for the next 30 min.     metoprolol succinate 25 MG 24 hr tablet  Commonly known as:  TOPROL-XL  Take 25 mg by mouth daily.     montelukast 10 MG tablet  Commonly known as:  SINGULAIR  Take 10 mg by mouth at bedtime.     Rivaroxaban 15 & 20 MG Tbpk  Commonly known as:  XARELTO STARTER PACK  Take as directed on package: Start with one  tablet by mouth twice a day with food. On Day 22, switch to one  tablet once a day with food.     rivaroxaban 20 MG Tabs tablet  Commonly known as:  XARELTO  Take 1 tablet (20 mg total) by mouth daily with supper.     traMADol 50 MG tablet  Commonly known as:  ULTRAM  Take 50-100 mg by mouth every 6 (six) hours as needed for moderate pain.     valsartan  320 MG tablet  Commonly known as:  DIOVAN  Take 320 mg by mouth daily.       Allergies  Allergen Reactions  . Arlice Colt Isothiocyanate] Hives  . Zestril [Lisinopril] Cough  . Zithromax [Azithromycin] Other (See Comments)    dizziness  . Codeine Nausea And Vomiting and Rash  . Novocain [Procaine] Rash   Follow-up Information    Follow up with Follow up with your PCP in 1-2 weeks. Schedule an appointment as soon as possible for a visit in 1 week.       The results of significant diagnostics from this hospitalization (including imaging, microbiology, ancillary and laboratory) are listed below for reference.    Significant Diagnostic Studies: No results found.  Microbiology: Recent Results (from the past 240 hour(s))  MRSA PCR Screening     Status: None   Collection Time: 12/14/14 10:02 PM  Result Value Ref Range Status   MRSA by PCR NEGATIVE NEGATIVE Final    Comment:        The GeneXpert MRSA Assay (FDA approved for NASAL specimens only), is one component of a comprehensive MRSA colonization surveillance program. It is not intended to diagnose MRSA infection nor to guide or monitor treatment for MRSA infections.      Labs: Basic Metabolic Panel:  Recent Labs Lab 12/15/14 0520  NA 141  K 3.1*  CL 102  CO2 31  GLUCOSE 169*  BUN 12  CREATININE 0.75  CALCIUM 8.0*   Liver Function Tests: No results for input(s): AST, ALT, ALKPHOS, BILITOT, PROT, ALBUMIN in the last 168 hours. No results for input(s): LIPASE, AMYLASE in the last 168 hours. No results for input(s): AMMONIA in the last 168 hours. CBC:  Recent Labs Lab 12/15/14 0100 12/15/14 0520 12/16/14 0539  WBC 8.0 10.8* 10.3  NEUTROABS 7.0  --   --   HGB 10.3* 10.8* 10.2*  HCT 32.1* 34.1* 33.8*  MCV 96.1 96.9 100.6*  PLT 332 339 394   Cardiac Enzymes:  Recent Labs Lab 12/15/14 0100 12/15/14 0520 12/15/14 1225  TROPONINI 0.03 0.03 <0.03   BNP: BNP (last 3 results) No results for  input(s): PROBNP in the last 8760 hours. CBG:  Recent Labs Lab 12/15/14 1307 12/15/14 1609 12/15/14 2119 12/16/14 0703 12/16/14 1134  GLUCAP 165* 121* 130* 95 137*   Signed:  CHIU, STEPHEN K  [facility] 12/16/2014, 2:40 PM

## 2014-12-16 NOTE — Progress Notes (Signed)
ANTICOAGULATION CONSULT NOTE - Follow Up Consult  Pharmacy Consult for Heparin Indication: pulmonary embolus and DVT  Allergies  Allergen Reactions  . Arlice ColtMustard [Allyl Isothiocyanate] Hives  . Zestril [Lisinopril] Cough  . Zithromax [Azithromycin] Other (See Comments)    dizziness  . Codeine Nausea And Vomiting and Rash  . Novocain [Procaine] Rash    Patient Measurements: Height: 5\' 3"  (160 cm) Weight: 189 lb 4.8 oz (85.866 kg) IBW/kg (Calculated) : 52.4 Heparin Dosing Weight: 71.6 kg  Vital Signs: Temp: 98.1 F (36.7 C) (01/09 0508) Temp Source: Oral (01/09 0508) BP: 121/61 mmHg (01/09 0508) Pulse Rate: 82 (01/09 0508)  Labs:  Recent Labs  12/15/14 0100 12/15/14 0520 12/15/14 1225 12/15/14 1632 12/16/14 0040 12/16/14 0539  HGB 10.3* 10.8*  --   --   --  10.2*  HCT 32.1* 34.1*  --   --   --  33.8*  PLT 332 339  --   --   --  394  LABPROT 14.6  --   --   --   --   --   INR 1.12  --   --   --   --   --   HEPARINUNFRC  --   --   --  0.34 0.55  --   CREATININE  --  0.75  --   --   --   --   TROPONINI 0.03 0.03 <0.03  --   --   --     Estimated Creatinine Clearance: 49.5 mL/min (by C-G formula based on Cr of 0.75).   Medications:  Infusions:  . sodium chloride 50 mL/hr at 12/15/14 2214  . heparin 1,050 Units/hr (12/16/14 0043)    Assessment: [age] yoF presented 1/7 PM to [facility] where CTA showed bilateral large PE with heart strain. She received one dose of Lovenox 90mg  given at ~3pm ([facility] Her PMH includes HTN, HLD, GERD, CAD, stroke, COPD, DM, and [facility], then transferred to SNF for rehab. She began having SOB at rehab about a week ago, but [facility]. Venous Duplex also + DVT in right CFV.  Pharmacy is consulted to dose Heparin drip, start on 1/7.  Today, 12/16/2014:  Heparin level 0.51, therapeutic on heparin 10.5 ml/hr  CBC: Hgb 10.2 (low, but  stable and near baseline), Plt 394  Rn reports no infusion problems, no bleeding or complications.   Goal of Therapy:  Heparin level 0.3-0.7 units/ml Monitor platelets by anticoagulation protocol: Yes   Plan:   Continue heparin IV infusion at 1050 units/hr (10.5 ml/hr)  Daily heparin level and CBC  Follow up long-term plan for oral anticoagulation.  Lynann Beaverhristine Latesha Chesney PharmD, BCPS Pager [phone] 12/16/2014 12:17 PM

## 2014-12-19 DIAGNOSIS — Z8701 Personal history of pneumonia (recurrent): Secondary | ICD-10-CM | POA: Diagnosis not present

## 2014-12-19 DIAGNOSIS — R262 Difficulty in walking, not elsewhere classified: Secondary | ICD-10-CM | POA: Diagnosis not present

## 2014-12-19 DIAGNOSIS — M6281 Muscle weakness (generalized): Secondary | ICD-10-CM | POA: Diagnosis not present

## 2014-12-20 DIAGNOSIS — R262 Difficulty in walking, not elsewhere classified: Secondary | ICD-10-CM | POA: Diagnosis not present

## 2014-12-20 DIAGNOSIS — M6281 Muscle weakness (generalized): Secondary | ICD-10-CM | POA: Diagnosis not present

## 2014-12-20 DIAGNOSIS — Z8701 Personal history of pneumonia (recurrent): Secondary | ICD-10-CM | POA: Diagnosis not present

## 2014-12-21 DIAGNOSIS — R262 Difficulty in walking, not elsewhere classified: Secondary | ICD-10-CM | POA: Diagnosis not present

## 2014-12-21 DIAGNOSIS — Z8701 Personal history of pneumonia (recurrent): Secondary | ICD-10-CM | POA: Diagnosis not present

## 2014-12-21 DIAGNOSIS — M6281 Muscle weakness (generalized): Secondary | ICD-10-CM | POA: Diagnosis not present

## 2014-12-22 DIAGNOSIS — M6281 Muscle weakness (generalized): Secondary | ICD-10-CM | POA: Diagnosis not present

## 2014-12-22 DIAGNOSIS — Z8701 Personal history of pneumonia (recurrent): Secondary | ICD-10-CM | POA: Diagnosis not present

## 2014-12-22 DIAGNOSIS — R262 Difficulty in walking, not elsewhere classified: Secondary | ICD-10-CM | POA: Diagnosis not present

## 2014-12-25 DIAGNOSIS — M6281 Muscle weakness (generalized): Secondary | ICD-10-CM | POA: Diagnosis not present

## 2014-12-25 DIAGNOSIS — R262 Difficulty in walking, not elsewhere classified: Secondary | ICD-10-CM | POA: Diagnosis not present

## 2014-12-25 DIAGNOSIS — Z8701 Personal history of pneumonia (recurrent): Secondary | ICD-10-CM | POA: Diagnosis not present

## 2014-12-26 DIAGNOSIS — R262 Difficulty in walking, not elsewhere classified: Secondary | ICD-10-CM | POA: Diagnosis not present

## 2014-12-26 DIAGNOSIS — Z8701 Personal history of pneumonia (recurrent): Secondary | ICD-10-CM | POA: Diagnosis not present

## 2014-12-26 DIAGNOSIS — M6281 Muscle weakness (generalized): Secondary | ICD-10-CM | POA: Diagnosis not present

## 2014-12-27 DIAGNOSIS — R262 Difficulty in walking, not elsewhere classified: Secondary | ICD-10-CM | POA: Diagnosis not present

## 2014-12-27 DIAGNOSIS — E118 Type 2 diabetes mellitus with unspecified complications: Secondary | ICD-10-CM | POA: Diagnosis not present

## 2014-12-27 DIAGNOSIS — I82401 Acute embolism and thrombosis of unspecified deep veins of right lower extremity: Secondary | ICD-10-CM | POA: Diagnosis not present

## 2014-12-27 DIAGNOSIS — Z8673 Personal history of transient ischemic attack (TIA), and cerebral infarction without residual deficits: Secondary | ICD-10-CM | POA: Diagnosis not present

## 2014-12-27 DIAGNOSIS — M79604 Pain in right leg: Secondary | ICD-10-CM | POA: Diagnosis not present

## 2014-12-27 DIAGNOSIS — J449 Chronic obstructive pulmonary disease, unspecified: Secondary | ICD-10-CM | POA: Diagnosis not present

## 2014-12-27 DIAGNOSIS — R531 Weakness: Secondary | ICD-10-CM | POA: Diagnosis not present

## 2014-12-27 DIAGNOSIS — E780 Pure hypercholesterolemia: Secondary | ICD-10-CM | POA: Diagnosis not present

## 2014-12-27 DIAGNOSIS — M6281 Muscle weakness (generalized): Secondary | ICD-10-CM | POA: Diagnosis not present

## 2014-12-27 DIAGNOSIS — I10 Essential (primary) hypertension: Secondary | ICD-10-CM | POA: Diagnosis not present

## 2014-12-27 DIAGNOSIS — R52 Pain, unspecified: Secondary | ICD-10-CM | POA: Diagnosis not present

## 2014-12-27 DIAGNOSIS — I82411 Acute embolism and thrombosis of right femoral vein: Secondary | ICD-10-CM | POA: Diagnosis not present

## 2014-12-27 DIAGNOSIS — I2510 Atherosclerotic heart disease of native coronary artery without angina pectoris: Secondary | ICD-10-CM | POA: Diagnosis not present

## 2014-12-27 DIAGNOSIS — Z8701 Personal history of pneumonia (recurrent): Secondary | ICD-10-CM | POA: Diagnosis not present

## 2014-12-28 DIAGNOSIS — I2699 Other pulmonary embolism without acute cor pulmonale: Secondary | ICD-10-CM | POA: Diagnosis not present

## 2014-12-28 DIAGNOSIS — I80291 Phlebitis and thrombophlebitis of other deep vessels of right lower extremity: Secondary | ICD-10-CM | POA: Diagnosis not present

## 2015-01-01 DIAGNOSIS — Z8701 Personal history of pneumonia (recurrent): Secondary | ICD-10-CM | POA: Diagnosis not present

## 2015-01-01 DIAGNOSIS — M6281 Muscle weakness (generalized): Secondary | ICD-10-CM | POA: Diagnosis not present

## 2015-01-01 DIAGNOSIS — R262 Difficulty in walking, not elsewhere classified: Secondary | ICD-10-CM | POA: Diagnosis not present

## 2015-01-04 DIAGNOSIS — M6281 Muscle weakness (generalized): Secondary | ICD-10-CM | POA: Diagnosis not present

## 2015-01-04 DIAGNOSIS — R262 Difficulty in walking, not elsewhere classified: Secondary | ICD-10-CM | POA: Diagnosis not present

## 2015-01-04 DIAGNOSIS — Z8701 Personal history of pneumonia (recurrent): Secondary | ICD-10-CM | POA: Diagnosis not present

## 2015-01-09 DIAGNOSIS — M6281 Muscle weakness (generalized): Secondary | ICD-10-CM | POA: Diagnosis not present

## 2015-01-09 DIAGNOSIS — Z8701 Personal history of pneumonia (recurrent): Secondary | ICD-10-CM | POA: Diagnosis not present

## 2015-01-09 DIAGNOSIS — R262 Difficulty in walking, not elsewhere classified: Secondary | ICD-10-CM | POA: Diagnosis not present

## 2015-01-17 DIAGNOSIS — Z8701 Personal history of pneumonia (recurrent): Secondary | ICD-10-CM | POA: Diagnosis not present

## 2015-01-17 DIAGNOSIS — R262 Difficulty in walking, not elsewhere classified: Secondary | ICD-10-CM | POA: Diagnosis not present

## 2015-01-17 DIAGNOSIS — M6281 Muscle weakness (generalized): Secondary | ICD-10-CM | POA: Diagnosis not present

## 2015-01-22 DIAGNOSIS — Z86718 Personal history of other venous thrombosis and embolism: Secondary | ICD-10-CM | POA: Diagnosis not present

## 2015-01-22 DIAGNOSIS — B029 Zoster without complications: Secondary | ICD-10-CM | POA: Diagnosis not present

## 2015-01-24 DIAGNOSIS — B029 Zoster without complications: Secondary | ICD-10-CM | POA: Diagnosis not present

## 2015-01-24 DIAGNOSIS — Z86718 Personal history of other venous thrombosis and embolism: Secondary | ICD-10-CM | POA: Diagnosis not present

## 2015-01-28 DIAGNOSIS — Z86718 Personal history of other venous thrombosis and embolism: Secondary | ICD-10-CM | POA: Diagnosis not present

## 2015-01-28 DIAGNOSIS — B029 Zoster without complications: Secondary | ICD-10-CM | POA: Diagnosis not present

## 2015-01-29 DIAGNOSIS — B029 Zoster without complications: Secondary | ICD-10-CM | POA: Diagnosis not present

## 2015-01-29 DIAGNOSIS — Z86718 Personal history of other venous thrombosis and embolism: Secondary | ICD-10-CM | POA: Diagnosis not present

## 2015-01-30 DIAGNOSIS — Z86718 Personal history of other venous thrombosis and embolism: Secondary | ICD-10-CM | POA: Diagnosis not present

## 2015-01-30 DIAGNOSIS — B029 Zoster without complications: Secondary | ICD-10-CM | POA: Diagnosis not present

## 2015-01-31 DIAGNOSIS — B029 Zoster without complications: Secondary | ICD-10-CM | POA: Diagnosis not present

## 2015-01-31 DIAGNOSIS — Z86718 Personal history of other venous thrombosis and embolism: Secondary | ICD-10-CM | POA: Diagnosis not present

## 2015-02-01 DIAGNOSIS — Z86718 Personal history of other venous thrombosis and embolism: Secondary | ICD-10-CM | POA: Diagnosis not present

## 2015-02-01 DIAGNOSIS — B029 Zoster without complications: Secondary | ICD-10-CM | POA: Diagnosis not present

## 2015-02-05 DIAGNOSIS — Z79891 Long term (current) use of opiate analgesic: Secondary | ICD-10-CM | POA: Diagnosis not present

## 2015-02-05 DIAGNOSIS — Z79899 Other long term (current) drug therapy: Secondary | ICD-10-CM | POA: Diagnosis not present

## 2015-02-05 DIAGNOSIS — E1140 Type 2 diabetes mellitus with diabetic neuropathy, unspecified: Secondary | ICD-10-CM | POA: Diagnosis not present

## 2015-02-05 DIAGNOSIS — I2699 Other pulmonary embolism without acute cor pulmonale: Secondary | ICD-10-CM | POA: Diagnosis not present

## 2015-02-05 DIAGNOSIS — I10 Essential (primary) hypertension: Secondary | ICD-10-CM | POA: Diagnosis not present

## 2015-02-05 DIAGNOSIS — I80291 Phlebitis and thrombophlebitis of other deep vessels of right lower extremity: Secondary | ICD-10-CM | POA: Diagnosis not present

## 2015-02-06 DIAGNOSIS — Z86718 Personal history of other venous thrombosis and embolism: Secondary | ICD-10-CM | POA: Diagnosis not present

## 2015-02-06 DIAGNOSIS — B029 Zoster without complications: Secondary | ICD-10-CM | POA: Diagnosis not present

## 2015-02-07 DIAGNOSIS — Z86718 Personal history of other venous thrombosis and embolism: Secondary | ICD-10-CM | POA: Diagnosis not present

## 2015-02-07 DIAGNOSIS — B029 Zoster without complications: Secondary | ICD-10-CM | POA: Diagnosis not present

## 2015-02-08 DIAGNOSIS — Z86718 Personal history of other venous thrombosis and embolism: Secondary | ICD-10-CM | POA: Diagnosis not present

## 2015-02-08 DIAGNOSIS — B029 Zoster without complications: Secondary | ICD-10-CM | POA: Diagnosis not present

## 2015-02-09 DIAGNOSIS — B029 Zoster without complications: Secondary | ICD-10-CM | POA: Diagnosis not present

## 2015-02-09 DIAGNOSIS — Z86718 Personal history of other venous thrombosis and embolism: Secondary | ICD-10-CM | POA: Diagnosis not present

## 2015-02-13 DIAGNOSIS — Z86718 Personal history of other venous thrombosis and embolism: Secondary | ICD-10-CM | POA: Diagnosis not present

## 2015-02-13 DIAGNOSIS — B029 Zoster without complications: Secondary | ICD-10-CM | POA: Diagnosis not present

## 2015-02-14 DIAGNOSIS — Z86718 Personal history of other venous thrombosis and embolism: Secondary | ICD-10-CM | POA: Diagnosis not present

## 2015-02-14 DIAGNOSIS — B029 Zoster without complications: Secondary | ICD-10-CM | POA: Diagnosis not present

## 2015-02-15 DIAGNOSIS — Z86718 Personal history of other venous thrombosis and embolism: Secondary | ICD-10-CM | POA: Diagnosis not present

## 2015-02-15 DIAGNOSIS — B029 Zoster without complications: Secondary | ICD-10-CM | POA: Diagnosis not present

## 2015-02-20 DIAGNOSIS — B029 Zoster without complications: Secondary | ICD-10-CM | POA: Diagnosis not present

## 2015-02-20 DIAGNOSIS — Z86718 Personal history of other venous thrombosis and embolism: Secondary | ICD-10-CM | POA: Diagnosis not present

## 2015-02-22 DIAGNOSIS — Z86718 Personal history of other venous thrombosis and embolism: Secondary | ICD-10-CM | POA: Diagnosis not present

## 2015-02-22 DIAGNOSIS — B029 Zoster without complications: Secondary | ICD-10-CM | POA: Diagnosis not present

## 2015-04-24 DIAGNOSIS — H3531 Nonexudative age-related macular degeneration: Secondary | ICD-10-CM | POA: Diagnosis not present

## 2015-05-03 DIAGNOSIS — I10 Essential (primary) hypertension: Secondary | ICD-10-CM | POA: Diagnosis not present

## 2015-05-03 DIAGNOSIS — E1140 Type 2 diabetes mellitus with diabetic neuropathy, unspecified: Secondary | ICD-10-CM | POA: Diagnosis not present

## 2015-05-03 DIAGNOSIS — E780 Pure hypercholesterolemia: Secondary | ICD-10-CM | POA: Diagnosis not present

## 2015-05-03 DIAGNOSIS — Z79899 Other long term (current) drug therapy: Secondary | ICD-10-CM | POA: Diagnosis not present

## 2015-08-17 DIAGNOSIS — I10 Essential (primary) hypertension: Secondary | ICD-10-CM | POA: Diagnosis not present

## 2015-08-17 DIAGNOSIS — E1140 Type 2 diabetes mellitus with diabetic neuropathy, unspecified: Secondary | ICD-10-CM | POA: Diagnosis not present

## 2015-08-17 DIAGNOSIS — E780 Pure hypercholesterolemia: Secondary | ICD-10-CM | POA: Diagnosis not present

## 2015-08-21 DIAGNOSIS — E1140 Type 2 diabetes mellitus with diabetic neuropathy, unspecified: Secondary | ICD-10-CM | POA: Diagnosis not present

## 2015-08-21 DIAGNOSIS — I10 Essential (primary) hypertension: Secondary | ICD-10-CM | POA: Diagnosis not present

## 2015-08-21 DIAGNOSIS — E780 Pure hypercholesterolemia: Secondary | ICD-10-CM | POA: Diagnosis not present

## 2015-09-04 DIAGNOSIS — J441 Chronic obstructive pulmonary disease with (acute) exacerbation: Secondary | ICD-10-CM | POA: Diagnosis not present

## 2015-09-17 DIAGNOSIS — Z6836 Body mass index (BMI) 36.0-36.9, adult: Secondary | ICD-10-CM | POA: Diagnosis not present

## 2015-09-17 DIAGNOSIS — Z7901 Long term (current) use of anticoagulants: Secondary | ICD-10-CM | POA: Diagnosis not present

## 2015-09-17 DIAGNOSIS — Z0000 Encounter for general adult medical examination without abnormal findings: Secondary | ICD-10-CM | POA: Diagnosis not present

## 2015-09-17 DIAGNOSIS — Z79899 Other long term (current) drug therapy: Secondary | ICD-10-CM | POA: Diagnosis not present

## 2015-09-17 DIAGNOSIS — J309 Allergic rhinitis, unspecified: Secondary | ICD-10-CM | POA: Diagnosis not present

## 2015-09-17 DIAGNOSIS — E669 Obesity, unspecified: Secondary | ICD-10-CM | POA: Diagnosis not present

## 2015-09-17 DIAGNOSIS — Z23 Encounter for immunization: Secondary | ICD-10-CM | POA: Diagnosis not present

## 2015-09-17 DIAGNOSIS — E1140 Type 2 diabetes mellitus with diabetic neuropathy, unspecified: Secondary | ICD-10-CM | POA: Diagnosis not present

## 2015-09-17 DIAGNOSIS — M810 Age-related osteoporosis without current pathological fracture: Secondary | ICD-10-CM | POA: Diagnosis not present

## 2015-09-17 DIAGNOSIS — K219 Gastro-esophageal reflux disease without esophagitis: Secondary | ICD-10-CM | POA: Diagnosis not present

## 2015-09-20 NOTE — Patient Outreach (Signed)
[facility]) Care Management  09/20/2015   [facility] 03/23/1923 [id number]   Referral from [employer] Tier 2 List, assigned [name], RN to outreach for [facility] services.  Thanks, Corrie MckusickLisa O. Sharlee BlewMoore, AABA [facility] Care [facility] CM Assistant Phone: [phone] Fax: [phone]

## 2015-09-24 ENCOUNTER — Other Ambulatory Visit: Payer: Self-pay | Admitting: *Deleted

## 2015-09-24 ENCOUNTER — Encounter: Payer: Self-pay | Admitting: *Deleted

## 2015-09-24 NOTE — Patient Outreach (Signed)
[facility]) Care Management  09/24/2015  [place] [name] 12/12/1922 [id number]   RN Health Coach telephone to patient. [employer] compliance verified. RN Health Coach spoke with patient family  and described the services available through [facility]. Patient declined service. RN Health Coach will send an outreach packet to patient.  Gean MaidensFrances Quantay [facility] BSN RN [facility] Care Management [phone]

## 2015-10-03 NOTE — Patient Outreach (Signed)
[facility]) Care Management  10/03/2015  [place] [facility] 07/08/1923 [id number]   Notification from [name], RN to close case due to patient refused [facility] Care [facility] services.  Thanks, Corrie MckusickLisa O. Sharlee BlewMoore, AABA [facility] Care Management [facility] CM Assistant Phone: [phone] Fax: [phone]

## 2015-10-30 DIAGNOSIS — E119 Type 2 diabetes mellitus without complications: Secondary | ICD-10-CM | POA: Diagnosis not present

## 2015-10-30 DIAGNOSIS — H353132 Nonexudative age-related macular degeneration, bilateral, intermediate dry stage: Secondary | ICD-10-CM | POA: Diagnosis not present

## 2015-11-27 DIAGNOSIS — J449 Chronic obstructive pulmonary disease, unspecified: Secondary | ICD-10-CM | POA: Diagnosis not present

## 2016-01-08 DIAGNOSIS — F4320 Adjustment disorder, unspecified: Secondary | ICD-10-CM | POA: Diagnosis not present

## 2016-01-08 DIAGNOSIS — R51 Headache: Secondary | ICD-10-CM | POA: Diagnosis not present

## 2016-01-08 DIAGNOSIS — R0602 Shortness of breath: Secondary | ICD-10-CM | POA: Diagnosis not present

## 2016-01-08 DIAGNOSIS — I10 Essential (primary) hypertension: Secondary | ICD-10-CM | POA: Diagnosis not present

## 2016-01-31 DIAGNOSIS — E7800 Pure hypercholesterolemia, unspecified: Secondary | ICD-10-CM | POA: Diagnosis not present

## 2016-01-31 DIAGNOSIS — E1140 Type 2 diabetes mellitus with diabetic neuropathy, unspecified: Secondary | ICD-10-CM | POA: Diagnosis not present

## 2016-01-31 DIAGNOSIS — J3089 Other allergic rhinitis: Secondary | ICD-10-CM | POA: Diagnosis not present

## 2016-01-31 DIAGNOSIS — M791 Myalgia: Secondary | ICD-10-CM | POA: Diagnosis not present

## 2016-01-31 DIAGNOSIS — I10 Essential (primary) hypertension: Secondary | ICD-10-CM | POA: Diagnosis not present

## 2016-02-29 DIAGNOSIS — Z880 Allergy status to penicillin: Secondary | ICD-10-CM | POA: Diagnosis not present

## 2016-02-29 DIAGNOSIS — Z9981 Dependence on supplemental oxygen: Secondary | ICD-10-CM | POA: Diagnosis not present

## 2016-02-29 DIAGNOSIS — J4520 Mild intermittent asthma, uncomplicated: Secondary | ICD-10-CM | POA: Diagnosis not present

## 2016-02-29 DIAGNOSIS — R0789 Other chest pain: Secondary | ICD-10-CM | POA: Diagnosis not present

## 2016-02-29 DIAGNOSIS — J449 Chronic obstructive pulmonary disease, unspecified: Secondary | ICD-10-CM | POA: Diagnosis not present

## 2016-02-29 DIAGNOSIS — Z888 Allergy status to other drugs, medicaments and biological substances status: Secondary | ICD-10-CM | POA: Diagnosis not present

## 2016-02-29 DIAGNOSIS — R0602 Shortness of breath: Secondary | ICD-10-CM | POA: Diagnosis not present

## 2016-02-29 DIAGNOSIS — Z79899 Other long term (current) drug therapy: Secondary | ICD-10-CM | POA: Diagnosis not present

## 2016-02-29 DIAGNOSIS — J069 Acute upper respiratory infection, unspecified: Secondary | ICD-10-CM | POA: Diagnosis not present

## 2016-02-29 DIAGNOSIS — R05 Cough: Secondary | ICD-10-CM | POA: Diagnosis not present

## 2016-02-29 DIAGNOSIS — Z882 Allergy status to sulfonamides status: Secondary | ICD-10-CM | POA: Diagnosis not present

## 2016-02-29 DIAGNOSIS — Z885 Allergy status to narcotic agent status: Secondary | ICD-10-CM | POA: Diagnosis not present

## 2016-03-06 DIAGNOSIS — R404 Transient alteration of awareness: Secondary | ICD-10-CM | POA: Diagnosis not present

## 2016-03-06 DIAGNOSIS — R0602 Shortness of breath: Secondary | ICD-10-CM | POA: Diagnosis not present

## 2016-03-06 DIAGNOSIS — E876 Hypokalemia: Secondary | ICD-10-CM | POA: Diagnosis not present

## 2016-03-06 DIAGNOSIS — R531 Weakness: Secondary | ICD-10-CM | POA: Diagnosis not present

## 2016-03-06 DIAGNOSIS — R05 Cough: Secondary | ICD-10-CM | POA: Diagnosis not present

## 2016-03-11 DIAGNOSIS — R05 Cough: Secondary | ICD-10-CM | POA: Diagnosis not present

## 2016-03-11 DIAGNOSIS — J441 Chronic obstructive pulmonary disease with (acute) exacerbation: Secondary | ICD-10-CM | POA: Diagnosis not present

## 2016-03-11 DIAGNOSIS — R079 Chest pain, unspecified: Secondary | ICD-10-CM | POA: Diagnosis not present

## 2016-03-12 DIAGNOSIS — E1142 Type 2 diabetes mellitus with diabetic polyneuropathy: Secondary | ICD-10-CM | POA: Diagnosis not present

## 2016-03-12 DIAGNOSIS — I2510 Atherosclerotic heart disease of native coronary artery without angina pectoris: Secondary | ICD-10-CM | POA: Diagnosis not present

## 2016-03-12 DIAGNOSIS — R0989 Other specified symptoms and signs involving the circulatory and respiratory systems: Secondary | ICD-10-CM | POA: Diagnosis not present

## 2016-03-12 DIAGNOSIS — M1990 Unspecified osteoarthritis, unspecified site: Secondary | ICD-10-CM | POA: Diagnosis not present

## 2016-03-12 DIAGNOSIS — R110 Nausea: Secondary | ICD-10-CM | POA: Diagnosis not present

## 2016-03-12 DIAGNOSIS — J45909 Unspecified asthma, uncomplicated: Secondary | ICD-10-CM | POA: Diagnosis not present

## 2016-03-12 DIAGNOSIS — R112 Nausea with vomiting, unspecified: Secondary | ICD-10-CM | POA: Diagnosis not present

## 2016-03-12 DIAGNOSIS — E860 Dehydration: Secondary | ICD-10-CM | POA: Diagnosis not present

## 2016-03-12 DIAGNOSIS — M791 Myalgia: Secondary | ICD-10-CM | POA: Diagnosis not present

## 2016-03-17 DIAGNOSIS — M1990 Unspecified osteoarthritis, unspecified site: Secondary | ICD-10-CM | POA: Diagnosis not present

## 2016-03-17 DIAGNOSIS — I2510 Atherosclerotic heart disease of native coronary artery without angina pectoris: Secondary | ICD-10-CM | POA: Diagnosis not present

## 2016-03-17 DIAGNOSIS — J45909 Unspecified asthma, uncomplicated: Secondary | ICD-10-CM | POA: Diagnosis not present

## 2016-03-17 DIAGNOSIS — E1142 Type 2 diabetes mellitus with diabetic polyneuropathy: Secondary | ICD-10-CM | POA: Diagnosis not present

## 2016-03-17 DIAGNOSIS — M791 Myalgia: Secondary | ICD-10-CM | POA: Diagnosis not present

## 2016-03-17 DIAGNOSIS — R0989 Other specified symptoms and signs involving the circulatory and respiratory systems: Secondary | ICD-10-CM | POA: Diagnosis not present

## 2016-03-18 DIAGNOSIS — S299XXA Unspecified injury of thorax, initial encounter: Secondary | ICD-10-CM | POA: Diagnosis not present

## 2016-03-18 DIAGNOSIS — S0003XA Contusion of scalp, initial encounter: Secondary | ICD-10-CM | POA: Diagnosis not present

## 2016-03-18 DIAGNOSIS — S63502A Unspecified sprain of left wrist, initial encounter: Secondary | ICD-10-CM | POA: Diagnosis not present

## 2016-03-18 DIAGNOSIS — M25532 Pain in left wrist: Secondary | ICD-10-CM | POA: Diagnosis not present

## 2016-03-18 DIAGNOSIS — S20212A Contusion of left front wall of thorax, initial encounter: Secondary | ICD-10-CM | POA: Diagnosis not present

## 2016-03-18 DIAGNOSIS — S6992XA Unspecified injury of left wrist, hand and finger(s), initial encounter: Secondary | ICD-10-CM | POA: Diagnosis not present

## 2016-03-18 DIAGNOSIS — R079 Chest pain, unspecified: Secondary | ICD-10-CM | POA: Diagnosis not present

## 2016-03-18 DIAGNOSIS — S0990XA Unspecified injury of head, initial encounter: Secondary | ICD-10-CM | POA: Diagnosis not present

## 2016-03-19 DIAGNOSIS — M1990 Unspecified osteoarthritis, unspecified site: Secondary | ICD-10-CM | POA: Diagnosis not present

## 2016-03-19 DIAGNOSIS — E1142 Type 2 diabetes mellitus with diabetic polyneuropathy: Secondary | ICD-10-CM | POA: Diagnosis not present

## 2016-03-19 DIAGNOSIS — M791 Myalgia: Secondary | ICD-10-CM | POA: Diagnosis not present

## 2016-03-19 DIAGNOSIS — J45909 Unspecified asthma, uncomplicated: Secondary | ICD-10-CM | POA: Diagnosis not present

## 2016-03-19 DIAGNOSIS — R0989 Other specified symptoms and signs involving the circulatory and respiratory systems: Secondary | ICD-10-CM | POA: Diagnosis not present

## 2016-03-19 DIAGNOSIS — I2510 Atherosclerotic heart disease of native coronary artery without angina pectoris: Secondary | ICD-10-CM | POA: Diagnosis not present

## 2016-03-20 DIAGNOSIS — R0989 Other specified symptoms and signs involving the circulatory and respiratory systems: Secondary | ICD-10-CM | POA: Diagnosis not present

## 2016-03-20 DIAGNOSIS — M1990 Unspecified osteoarthritis, unspecified site: Secondary | ICD-10-CM | POA: Diagnosis not present

## 2016-03-20 DIAGNOSIS — M791 Myalgia: Secondary | ICD-10-CM | POA: Diagnosis not present

## 2016-03-20 DIAGNOSIS — J45909 Unspecified asthma, uncomplicated: Secondary | ICD-10-CM | POA: Diagnosis not present

## 2016-03-20 DIAGNOSIS — I2510 Atherosclerotic heart disease of native coronary artery without angina pectoris: Secondary | ICD-10-CM | POA: Diagnosis not present

## 2016-03-20 DIAGNOSIS — E1142 Type 2 diabetes mellitus with diabetic polyneuropathy: Secondary | ICD-10-CM | POA: Diagnosis not present

## 2016-03-21 DIAGNOSIS — M791 Myalgia: Secondary | ICD-10-CM | POA: Diagnosis not present

## 2016-03-21 DIAGNOSIS — M1990 Unspecified osteoarthritis, unspecified site: Secondary | ICD-10-CM | POA: Diagnosis not present

## 2016-03-21 DIAGNOSIS — E1142 Type 2 diabetes mellitus with diabetic polyneuropathy: Secondary | ICD-10-CM | POA: Diagnosis not present

## 2016-03-21 DIAGNOSIS — I2510 Atherosclerotic heart disease of native coronary artery without angina pectoris: Secondary | ICD-10-CM | POA: Diagnosis not present

## 2016-03-21 DIAGNOSIS — R0989 Other specified symptoms and signs involving the circulatory and respiratory systems: Secondary | ICD-10-CM | POA: Diagnosis not present

## 2016-03-21 DIAGNOSIS — J45909 Unspecified asthma, uncomplicated: Secondary | ICD-10-CM | POA: Diagnosis not present

## 2016-03-24 DIAGNOSIS — M1990 Unspecified osteoarthritis, unspecified site: Secondary | ICD-10-CM | POA: Diagnosis not present

## 2016-03-24 DIAGNOSIS — E1142 Type 2 diabetes mellitus with diabetic polyneuropathy: Secondary | ICD-10-CM | POA: Diagnosis not present

## 2016-03-24 DIAGNOSIS — M791 Myalgia: Secondary | ICD-10-CM | POA: Diagnosis not present

## 2016-03-24 DIAGNOSIS — I2510 Atherosclerotic heart disease of native coronary artery without angina pectoris: Secondary | ICD-10-CM | POA: Diagnosis not present

## 2016-03-24 DIAGNOSIS — R0989 Other specified symptoms and signs involving the circulatory and respiratory systems: Secondary | ICD-10-CM | POA: Diagnosis not present

## 2016-03-24 DIAGNOSIS — J45909 Unspecified asthma, uncomplicated: Secondary | ICD-10-CM | POA: Diagnosis not present

## 2016-03-25 DIAGNOSIS — R0989 Other specified symptoms and signs involving the circulatory and respiratory systems: Secondary | ICD-10-CM | POA: Diagnosis not present

## 2016-03-25 DIAGNOSIS — J45909 Unspecified asthma, uncomplicated: Secondary | ICD-10-CM | POA: Diagnosis not present

## 2016-03-25 DIAGNOSIS — I2510 Atherosclerotic heart disease of native coronary artery without angina pectoris: Secondary | ICD-10-CM | POA: Diagnosis not present

## 2016-03-25 DIAGNOSIS — M1990 Unspecified osteoarthritis, unspecified site: Secondary | ICD-10-CM | POA: Diagnosis not present

## 2016-03-25 DIAGNOSIS — E1142 Type 2 diabetes mellitus with diabetic polyneuropathy: Secondary | ICD-10-CM | POA: Diagnosis not present

## 2016-03-25 DIAGNOSIS — M791 Myalgia: Secondary | ICD-10-CM | POA: Diagnosis not present

## 2016-03-26 DIAGNOSIS — E1142 Type 2 diabetes mellitus with diabetic polyneuropathy: Secondary | ICD-10-CM | POA: Diagnosis not present

## 2016-03-26 DIAGNOSIS — M791 Myalgia: Secondary | ICD-10-CM | POA: Diagnosis not present

## 2016-03-26 DIAGNOSIS — J45909 Unspecified asthma, uncomplicated: Secondary | ICD-10-CM | POA: Diagnosis not present

## 2016-03-26 DIAGNOSIS — M25532 Pain in left wrist: Secondary | ICD-10-CM | POA: Diagnosis not present

## 2016-03-26 DIAGNOSIS — I2510 Atherosclerotic heart disease of native coronary artery without angina pectoris: Secondary | ICD-10-CM | POA: Diagnosis not present

## 2016-03-26 DIAGNOSIS — M1990 Unspecified osteoarthritis, unspecified site: Secondary | ICD-10-CM | POA: Diagnosis not present

## 2016-03-26 DIAGNOSIS — J3089 Other allergic rhinitis: Secondary | ICD-10-CM | POA: Diagnosis not present

## 2016-03-26 DIAGNOSIS — R0989 Other specified symptoms and signs involving the circulatory and respiratory systems: Secondary | ICD-10-CM | POA: Diagnosis not present

## 2016-03-26 DIAGNOSIS — Z9181 History of falling: Secondary | ICD-10-CM | POA: Diagnosis not present

## 2016-03-27 DIAGNOSIS — E1142 Type 2 diabetes mellitus with diabetic polyneuropathy: Secondary | ICD-10-CM | POA: Diagnosis not present

## 2016-03-27 DIAGNOSIS — M1990 Unspecified osteoarthritis, unspecified site: Secondary | ICD-10-CM | POA: Diagnosis not present

## 2016-03-27 DIAGNOSIS — I2510 Atherosclerotic heart disease of native coronary artery without angina pectoris: Secondary | ICD-10-CM | POA: Diagnosis not present

## 2016-03-27 DIAGNOSIS — R0989 Other specified symptoms and signs involving the circulatory and respiratory systems: Secondary | ICD-10-CM | POA: Diagnosis not present

## 2016-03-27 DIAGNOSIS — J45909 Unspecified asthma, uncomplicated: Secondary | ICD-10-CM | POA: Diagnosis not present

## 2016-03-27 DIAGNOSIS — M791 Myalgia: Secondary | ICD-10-CM | POA: Diagnosis not present

## 2016-03-31 DIAGNOSIS — M1990 Unspecified osteoarthritis, unspecified site: Secondary | ICD-10-CM | POA: Diagnosis not present

## 2016-03-31 DIAGNOSIS — I2510 Atherosclerotic heart disease of native coronary artery without angina pectoris: Secondary | ICD-10-CM | POA: Diagnosis not present

## 2016-03-31 DIAGNOSIS — R0989 Other specified symptoms and signs involving the circulatory and respiratory systems: Secondary | ICD-10-CM | POA: Diagnosis not present

## 2016-03-31 DIAGNOSIS — M791 Myalgia: Secondary | ICD-10-CM | POA: Diagnosis not present

## 2016-03-31 DIAGNOSIS — J45909 Unspecified asthma, uncomplicated: Secondary | ICD-10-CM | POA: Diagnosis not present

## 2016-03-31 DIAGNOSIS — E1142 Type 2 diabetes mellitus with diabetic polyneuropathy: Secondary | ICD-10-CM | POA: Diagnosis not present

## 2016-04-02 DIAGNOSIS — M1990 Unspecified osteoarthritis, unspecified site: Secondary | ICD-10-CM | POA: Diagnosis not present

## 2016-04-02 DIAGNOSIS — M791 Myalgia: Secondary | ICD-10-CM | POA: Diagnosis not present

## 2016-04-02 DIAGNOSIS — I2510 Atherosclerotic heart disease of native coronary artery without angina pectoris: Secondary | ICD-10-CM | POA: Diagnosis not present

## 2016-04-02 DIAGNOSIS — J45909 Unspecified asthma, uncomplicated: Secondary | ICD-10-CM | POA: Diagnosis not present

## 2016-04-02 DIAGNOSIS — R0989 Other specified symptoms and signs involving the circulatory and respiratory systems: Secondary | ICD-10-CM | POA: Diagnosis not present

## 2016-04-02 DIAGNOSIS — E1142 Type 2 diabetes mellitus with diabetic polyneuropathy: Secondary | ICD-10-CM | POA: Diagnosis not present

## 2016-04-04 DIAGNOSIS — I2510 Atherosclerotic heart disease of native coronary artery without angina pectoris: Secondary | ICD-10-CM | POA: Diagnosis not present

## 2016-04-04 DIAGNOSIS — M1990 Unspecified osteoarthritis, unspecified site: Secondary | ICD-10-CM | POA: Diagnosis not present

## 2016-04-04 DIAGNOSIS — E1142 Type 2 diabetes mellitus with diabetic polyneuropathy: Secondary | ICD-10-CM | POA: Diagnosis not present

## 2016-04-04 DIAGNOSIS — M791 Myalgia: Secondary | ICD-10-CM | POA: Diagnosis not present

## 2016-04-04 DIAGNOSIS — J45909 Unspecified asthma, uncomplicated: Secondary | ICD-10-CM | POA: Diagnosis not present

## 2016-04-04 DIAGNOSIS — R0989 Other specified symptoms and signs involving the circulatory and respiratory systems: Secondary | ICD-10-CM | POA: Diagnosis not present

## 2016-04-07 DIAGNOSIS — R0989 Other specified symptoms and signs involving the circulatory and respiratory systems: Secondary | ICD-10-CM | POA: Diagnosis not present

## 2016-04-07 DIAGNOSIS — E1142 Type 2 diabetes mellitus with diabetic polyneuropathy: Secondary | ICD-10-CM | POA: Diagnosis not present

## 2016-04-07 DIAGNOSIS — J45909 Unspecified asthma, uncomplicated: Secondary | ICD-10-CM | POA: Diagnosis not present

## 2016-04-07 DIAGNOSIS — M791 Myalgia: Secondary | ICD-10-CM | POA: Diagnosis not present

## 2016-04-07 DIAGNOSIS — M1990 Unspecified osteoarthritis, unspecified site: Secondary | ICD-10-CM | POA: Diagnosis not present

## 2016-04-07 DIAGNOSIS — I2510 Atherosclerotic heart disease of native coronary artery without angina pectoris: Secondary | ICD-10-CM | POA: Diagnosis not present

## 2016-04-08 DIAGNOSIS — I2510 Atherosclerotic heart disease of native coronary artery without angina pectoris: Secondary | ICD-10-CM | POA: Diagnosis not present

## 2016-04-08 DIAGNOSIS — M1990 Unspecified osteoarthritis, unspecified site: Secondary | ICD-10-CM | POA: Diagnosis not present

## 2016-04-08 DIAGNOSIS — M791 Myalgia: Secondary | ICD-10-CM | POA: Diagnosis not present

## 2016-04-08 DIAGNOSIS — R0989 Other specified symptoms and signs involving the circulatory and respiratory systems: Secondary | ICD-10-CM | POA: Diagnosis not present

## 2016-04-08 DIAGNOSIS — J45909 Unspecified asthma, uncomplicated: Secondary | ICD-10-CM | POA: Diagnosis not present

## 2016-04-08 DIAGNOSIS — E1142 Type 2 diabetes mellitus with diabetic polyneuropathy: Secondary | ICD-10-CM | POA: Diagnosis not present

## 2016-04-11 DIAGNOSIS — J45909 Unspecified asthma, uncomplicated: Secondary | ICD-10-CM | POA: Diagnosis not present

## 2016-04-11 DIAGNOSIS — E1142 Type 2 diabetes mellitus with diabetic polyneuropathy: Secondary | ICD-10-CM | POA: Diagnosis not present

## 2016-04-11 DIAGNOSIS — R0989 Other specified symptoms and signs involving the circulatory and respiratory systems: Secondary | ICD-10-CM | POA: Diagnosis not present

## 2016-04-11 DIAGNOSIS — I2510 Atherosclerotic heart disease of native coronary artery without angina pectoris: Secondary | ICD-10-CM | POA: Diagnosis not present

## 2016-04-11 DIAGNOSIS — M791 Myalgia: Secondary | ICD-10-CM | POA: Diagnosis not present

## 2016-04-11 DIAGNOSIS — M1990 Unspecified osteoarthritis, unspecified site: Secondary | ICD-10-CM | POA: Diagnosis not present

## 2016-04-30 DIAGNOSIS — E7800 Pure hypercholesterolemia, unspecified: Secondary | ICD-10-CM | POA: Diagnosis not present

## 2016-04-30 DIAGNOSIS — R8290 Unspecified abnormal findings in urine: Secondary | ICD-10-CM | POA: Diagnosis not present

## 2016-04-30 DIAGNOSIS — Z7901 Long term (current) use of anticoagulants: Secondary | ICD-10-CM | POA: Diagnosis not present

## 2016-04-30 DIAGNOSIS — I10 Essential (primary) hypertension: Secondary | ICD-10-CM | POA: Diagnosis not present

## 2016-04-30 DIAGNOSIS — I499 Cardiac arrhythmia, unspecified: Secondary | ICD-10-CM | POA: Diagnosis not present

## 2016-04-30 DIAGNOSIS — R609 Edema, unspecified: Secondary | ICD-10-CM | POA: Diagnosis not present

## 2016-04-30 DIAGNOSIS — R110 Nausea: Secondary | ICD-10-CM | POA: Diagnosis not present

## 2016-04-30 DIAGNOSIS — E1140 Type 2 diabetes mellitus with diabetic neuropathy, unspecified: Secondary | ICD-10-CM | POA: Diagnosis not present

## 2016-06-05 DIAGNOSIS — G4489 Other headache syndrome: Secondary | ICD-10-CM | POA: Diagnosis not present

## 2016-06-05 DIAGNOSIS — R404 Transient alteration of awareness: Secondary | ICD-10-CM | POA: Diagnosis not present

## 2016-06-05 DIAGNOSIS — R51 Headache: Secondary | ICD-10-CM | POA: Diagnosis not present

## 2016-06-05 DIAGNOSIS — I4891 Unspecified atrial fibrillation: Secondary | ICD-10-CM | POA: Diagnosis not present

## 2016-06-05 DIAGNOSIS — R531 Weakness: Secondary | ICD-10-CM | POA: Diagnosis not present

## 2016-06-05 DIAGNOSIS — I509 Heart failure, unspecified: Secondary | ICD-10-CM | POA: Diagnosis not present

## 2016-06-05 DIAGNOSIS — R079 Chest pain, unspecified: Secondary | ICD-10-CM | POA: Diagnosis not present

## 2016-06-12 DIAGNOSIS — E7800 Pure hypercholesterolemia, unspecified: Secondary | ICD-10-CM | POA: Diagnosis not present

## 2016-06-12 DIAGNOSIS — G44209 Tension-type headache, unspecified, not intractable: Secondary | ICD-10-CM | POA: Diagnosis not present

## 2016-06-12 DIAGNOSIS — R009 Unspecified abnormalities of heart beat: Secondary | ICD-10-CM | POA: Diagnosis not present

## 2016-06-12 DIAGNOSIS — J301 Allergic rhinitis due to pollen: Secondary | ICD-10-CM | POA: Diagnosis not present

## 2016-06-12 DIAGNOSIS — M150 Primary generalized (osteo)arthritis: Secondary | ICD-10-CM | POA: Diagnosis not present

## 2016-06-12 DIAGNOSIS — I10 Essential (primary) hypertension: Secondary | ICD-10-CM | POA: Diagnosis not present

## 2016-10-09 DIAGNOSIS — R011 Cardiac murmur, unspecified: Secondary | ICD-10-CM | POA: Diagnosis not present

## 2016-10-09 DIAGNOSIS — Z23 Encounter for immunization: Secondary | ICD-10-CM | POA: Diagnosis not present

## 2016-10-09 DIAGNOSIS — J449 Chronic obstructive pulmonary disease, unspecified: Secondary | ICD-10-CM | POA: Diagnosis not present

## 2016-10-09 DIAGNOSIS — I10 Essential (primary) hypertension: Secondary | ICD-10-CM | POA: Diagnosis not present

## 2016-10-09 DIAGNOSIS — E1140 Type 2 diabetes mellitus with diabetic neuropathy, unspecified: Secondary | ICD-10-CM | POA: Diagnosis not present

## 2016-10-09 DIAGNOSIS — E7800 Pure hypercholesterolemia, unspecified: Secondary | ICD-10-CM | POA: Diagnosis not present

## 2016-10-20 DIAGNOSIS — Z7401 Bed confinement status: Secondary | ICD-10-CM | POA: Diagnosis not present

## 2016-10-20 DIAGNOSIS — M7989 Other specified soft tissue disorders: Secondary | ICD-10-CM | POA: Diagnosis not present

## 2016-10-20 DIAGNOSIS — R279 Unspecified lack of coordination: Secondary | ICD-10-CM | POA: Diagnosis not present

## 2016-10-20 DIAGNOSIS — S93402A Sprain of unspecified ligament of left ankle, initial encounter: Secondary | ICD-10-CM | POA: Diagnosis not present

## 2016-10-20 DIAGNOSIS — M79672 Pain in left foot: Secondary | ICD-10-CM | POA: Diagnosis not present

## 2016-10-23 DIAGNOSIS — Z7901 Long term (current) use of anticoagulants: Secondary | ICD-10-CM | POA: Diagnosis not present

## 2016-10-23 DIAGNOSIS — Z86711 Personal history of pulmonary embolism: Secondary | ICD-10-CM | POA: Diagnosis not present

## 2016-10-23 DIAGNOSIS — I10 Essential (primary) hypertension: Secondary | ICD-10-CM | POA: Diagnosis not present

## 2016-10-23 DIAGNOSIS — S61215D Laceration without foreign body of left ring finger without damage to nail, subsequent encounter: Secondary | ICD-10-CM | POA: Diagnosis not present

## 2016-10-23 DIAGNOSIS — E559 Vitamin D deficiency, unspecified: Secondary | ICD-10-CM | POA: Diagnosis not present

## 2016-10-23 DIAGNOSIS — E1142 Type 2 diabetes mellitus with diabetic polyneuropathy: Secondary | ICD-10-CM | POA: Diagnosis not present

## 2016-10-23 DIAGNOSIS — M810 Age-related osteoporosis without current pathological fracture: Secondary | ICD-10-CM | POA: Diagnosis not present

## 2016-10-23 DIAGNOSIS — E7800 Pure hypercholesterolemia, unspecified: Secondary | ICD-10-CM | POA: Diagnosis not present

## 2016-10-23 DIAGNOSIS — I2510 Atherosclerotic heart disease of native coronary artery without angina pectoris: Secondary | ICD-10-CM | POA: Diagnosis not present

## 2016-10-23 DIAGNOSIS — Z9181 History of falling: Secondary | ICD-10-CM | POA: Diagnosis not present

## 2016-10-23 DIAGNOSIS — S93402D Sprain of unspecified ligament of left ankle, subsequent encounter: Secondary | ICD-10-CM | POA: Diagnosis not present

## 2016-10-23 DIAGNOSIS — Z86718 Personal history of other venous thrombosis and embolism: Secondary | ICD-10-CM | POA: Diagnosis not present

## 2016-10-23 DIAGNOSIS — K219 Gastro-esophageal reflux disease without esophagitis: Secondary | ICD-10-CM | POA: Diagnosis not present

## 2016-10-23 DIAGNOSIS — Z8701 Personal history of pneumonia (recurrent): Secondary | ICD-10-CM | POA: Diagnosis not present

## 2016-10-23 DIAGNOSIS — Z8673 Personal history of transient ischemic attack (TIA), and cerebral infarction without residual deficits: Secondary | ICD-10-CM | POA: Diagnosis not present

## 2016-10-23 DIAGNOSIS — J449 Chronic obstructive pulmonary disease, unspecified: Secondary | ICD-10-CM | POA: Diagnosis not present

## 2016-10-23 DIAGNOSIS — M1991 Primary osteoarthritis, unspecified site: Secondary | ICD-10-CM | POA: Diagnosis not present

## 2016-10-24 DIAGNOSIS — J449 Chronic obstructive pulmonary disease, unspecified: Secondary | ICD-10-CM | POA: Diagnosis not present

## 2016-10-24 DIAGNOSIS — E1142 Type 2 diabetes mellitus with diabetic polyneuropathy: Secondary | ICD-10-CM | POA: Diagnosis not present

## 2016-10-24 DIAGNOSIS — I10 Essential (primary) hypertension: Secondary | ICD-10-CM | POA: Diagnosis not present

## 2016-10-24 DIAGNOSIS — S93402D Sprain of unspecified ligament of left ankle, subsequent encounter: Secondary | ICD-10-CM | POA: Diagnosis not present

## 2016-10-24 DIAGNOSIS — I2510 Atherosclerotic heart disease of native coronary artery without angina pectoris: Secondary | ICD-10-CM | POA: Diagnosis not present

## 2016-10-24 DIAGNOSIS — S61215D Laceration without foreign body of left ring finger without damage to nail, subsequent encounter: Secondary | ICD-10-CM | POA: Diagnosis not present

## 2016-10-29 DIAGNOSIS — E1142 Type 2 diabetes mellitus with diabetic polyneuropathy: Secondary | ICD-10-CM | POA: Diagnosis not present

## 2016-10-29 DIAGNOSIS — I2510 Atherosclerotic heart disease of native coronary artery without angina pectoris: Secondary | ICD-10-CM | POA: Diagnosis not present

## 2016-10-29 DIAGNOSIS — S61215D Laceration without foreign body of left ring finger without damage to nail, subsequent encounter: Secondary | ICD-10-CM | POA: Diagnosis not present

## 2016-10-29 DIAGNOSIS — S93402D Sprain of unspecified ligament of left ankle, subsequent encounter: Secondary | ICD-10-CM | POA: Diagnosis not present

## 2016-10-29 DIAGNOSIS — J449 Chronic obstructive pulmonary disease, unspecified: Secondary | ICD-10-CM | POA: Diagnosis not present

## 2016-10-29 DIAGNOSIS — I10 Essential (primary) hypertension: Secondary | ICD-10-CM | POA: Diagnosis not present

## 2016-10-31 DIAGNOSIS — E1142 Type 2 diabetes mellitus with diabetic polyneuropathy: Secondary | ICD-10-CM | POA: Diagnosis not present

## 2016-10-31 DIAGNOSIS — S61215D Laceration without foreign body of left ring finger without damage to nail, subsequent encounter: Secondary | ICD-10-CM | POA: Diagnosis not present

## 2016-10-31 DIAGNOSIS — J449 Chronic obstructive pulmonary disease, unspecified: Secondary | ICD-10-CM | POA: Diagnosis not present

## 2016-10-31 DIAGNOSIS — S93402D Sprain of unspecified ligament of left ankle, subsequent encounter: Secondary | ICD-10-CM | POA: Diagnosis not present

## 2016-10-31 DIAGNOSIS — I10 Essential (primary) hypertension: Secondary | ICD-10-CM | POA: Diagnosis not present

## 2016-10-31 DIAGNOSIS — I2510 Atherosclerotic heart disease of native coronary artery without angina pectoris: Secondary | ICD-10-CM | POA: Diagnosis not present

## 2016-11-03 DIAGNOSIS — J449 Chronic obstructive pulmonary disease, unspecified: Secondary | ICD-10-CM | POA: Diagnosis not present

## 2016-11-03 DIAGNOSIS — S93402D Sprain of unspecified ligament of left ankle, subsequent encounter: Secondary | ICD-10-CM | POA: Diagnosis not present

## 2016-11-03 DIAGNOSIS — S61215D Laceration without foreign body of left ring finger without damage to nail, subsequent encounter: Secondary | ICD-10-CM | POA: Diagnosis not present

## 2016-11-03 DIAGNOSIS — I10 Essential (primary) hypertension: Secondary | ICD-10-CM | POA: Diagnosis not present

## 2016-11-03 DIAGNOSIS — I2510 Atherosclerotic heart disease of native coronary artery without angina pectoris: Secondary | ICD-10-CM | POA: Diagnosis not present

## 2016-11-03 DIAGNOSIS — E1142 Type 2 diabetes mellitus with diabetic polyneuropathy: Secondary | ICD-10-CM | POA: Diagnosis not present

## 2016-11-04 DIAGNOSIS — I2510 Atherosclerotic heart disease of native coronary artery without angina pectoris: Secondary | ICD-10-CM | POA: Diagnosis not present

## 2016-11-04 DIAGNOSIS — J449 Chronic obstructive pulmonary disease, unspecified: Secondary | ICD-10-CM | POA: Diagnosis not present

## 2016-11-04 DIAGNOSIS — S93402D Sprain of unspecified ligament of left ankle, subsequent encounter: Secondary | ICD-10-CM | POA: Diagnosis not present

## 2016-11-04 DIAGNOSIS — I10 Essential (primary) hypertension: Secondary | ICD-10-CM | POA: Diagnosis not present

## 2016-11-04 DIAGNOSIS — E1142 Type 2 diabetes mellitus with diabetic polyneuropathy: Secondary | ICD-10-CM | POA: Diagnosis not present

## 2016-11-04 DIAGNOSIS — S61215D Laceration without foreign body of left ring finger without damage to nail, subsequent encounter: Secondary | ICD-10-CM | POA: Diagnosis not present

## 2016-11-06 DIAGNOSIS — J398 Other specified diseases of upper respiratory tract: Secondary | ICD-10-CM | POA: Diagnosis not present

## 2016-11-06 DIAGNOSIS — E1142 Type 2 diabetes mellitus with diabetic polyneuropathy: Secondary | ICD-10-CM | POA: Diagnosis not present

## 2016-11-06 DIAGNOSIS — G5720 Lesion of femoral nerve, unspecified lower limb: Secondary | ICD-10-CM | POA: Diagnosis not present

## 2016-11-06 DIAGNOSIS — J069 Acute upper respiratory infection, unspecified: Secondary | ICD-10-CM | POA: Diagnosis not present

## 2016-11-06 DIAGNOSIS — S93492A Sprain of other ligament of left ankle, initial encounter: Secondary | ICD-10-CM | POA: Diagnosis not present

## 2016-11-06 DIAGNOSIS — S93402D Sprain of unspecified ligament of left ankle, subsequent encounter: Secondary | ICD-10-CM | POA: Diagnosis not present

## 2016-11-06 DIAGNOSIS — S61215D Laceration without foreign body of left ring finger without damage to nail, subsequent encounter: Secondary | ICD-10-CM | POA: Diagnosis not present

## 2016-11-06 DIAGNOSIS — I10 Essential (primary) hypertension: Secondary | ICD-10-CM | POA: Diagnosis not present

## 2016-11-07 DIAGNOSIS — J9621 Acute and chronic respiratory failure with hypoxia: Secondary | ICD-10-CM | POA: Diagnosis present

## 2016-11-07 DIAGNOSIS — I2510 Atherosclerotic heart disease of native coronary artery without angina pectoris: Secondary | ICD-10-CM | POA: Diagnosis present

## 2016-11-07 DIAGNOSIS — J9622 Acute and chronic respiratory failure with hypercapnia: Secondary | ICD-10-CM | POA: Diagnosis present

## 2016-11-07 DIAGNOSIS — R279 Unspecified lack of coordination: Secondary | ICD-10-CM | POA: Diagnosis not present

## 2016-11-07 DIAGNOSIS — J449 Chronic obstructive pulmonary disease, unspecified: Secondary | ICD-10-CM | POA: Diagnosis not present

## 2016-11-07 DIAGNOSIS — Z7952 Long term (current) use of systemic steroids: Secondary | ICD-10-CM | POA: Diagnosis not present

## 2016-11-07 DIAGNOSIS — S61215D Laceration without foreign body of left ring finger without damage to nail, subsequent encounter: Secondary | ICD-10-CM | POA: Diagnosis not present

## 2016-11-07 DIAGNOSIS — I110 Hypertensive heart disease with heart failure: Secondary | ICD-10-CM | POA: Diagnosis present

## 2016-11-07 DIAGNOSIS — I482 Chronic atrial fibrillation: Secondary | ICD-10-CM | POA: Diagnosis present

## 2016-11-07 DIAGNOSIS — I509 Heart failure, unspecified: Secondary | ICD-10-CM | POA: Diagnosis present

## 2016-11-07 DIAGNOSIS — Z86718 Personal history of other venous thrombosis and embolism: Secondary | ICD-10-CM | POA: Diagnosis not present

## 2016-11-07 DIAGNOSIS — I10 Essential (primary) hypertension: Secondary | ICD-10-CM | POA: Diagnosis present

## 2016-11-07 DIAGNOSIS — Z7401 Bed confinement status: Secondary | ICD-10-CM | POA: Diagnosis not present

## 2016-11-07 DIAGNOSIS — R0602 Shortness of breath: Secondary | ICD-10-CM | POA: Diagnosis not present

## 2016-11-07 DIAGNOSIS — S93402D Sprain of unspecified ligament of left ankle, subsequent encounter: Secondary | ICD-10-CM | POA: Diagnosis not present

## 2016-11-07 DIAGNOSIS — J9691 Respiratory failure, unspecified with hypoxia: Secondary | ICD-10-CM | POA: Diagnosis not present

## 2016-11-07 DIAGNOSIS — Z79899 Other long term (current) drug therapy: Secondary | ICD-10-CM | POA: Diagnosis not present

## 2016-11-07 DIAGNOSIS — E119 Type 2 diabetes mellitus without complications: Secondary | ICD-10-CM | POA: Diagnosis not present

## 2016-11-07 DIAGNOSIS — E875 Hyperkalemia: Secondary | ICD-10-CM

## 2016-11-07 DIAGNOSIS — J9601 Acute respiratory failure with hypoxia: Secondary | ICD-10-CM | POA: Diagnosis not present

## 2016-11-07 DIAGNOSIS — R069 Unspecified abnormalities of breathing: Secondary | ICD-10-CM | POA: Diagnosis not present

## 2016-11-07 DIAGNOSIS — E785 Hyperlipidemia, unspecified: Secondary | ICD-10-CM | POA: Diagnosis present

## 2016-11-07 DIAGNOSIS — Z7901 Long term (current) use of anticoagulants: Secondary | ICD-10-CM | POA: Diagnosis not present

## 2016-11-07 DIAGNOSIS — E1142 Type 2 diabetes mellitus with diabetic polyneuropathy: Secondary | ICD-10-CM | POA: Diagnosis not present

## 2016-11-07 DIAGNOSIS — K219 Gastro-esophageal reflux disease without esophagitis: Secondary | ICD-10-CM | POA: Diagnosis present

## 2016-11-07 DIAGNOSIS — Z8673 Personal history of transient ischemic attack (TIA), and cerebral infarction without residual deficits: Secondary | ICD-10-CM | POA: Diagnosis not present

## 2016-11-07 DIAGNOSIS — I4891 Unspecified atrial fibrillation: Secondary | ICD-10-CM | POA: Diagnosis not present

## 2016-11-07 DIAGNOSIS — J441 Chronic obstructive pulmonary disease with (acute) exacerbation: Secondary | ICD-10-CM | POA: Diagnosis not present

## 2016-11-07 DIAGNOSIS — J440 Chronic obstructive pulmonary disease with acute lower respiratory infection: Secondary | ICD-10-CM | POA: Diagnosis not present

## 2016-11-07 DIAGNOSIS — J180 Bronchopneumonia, unspecified organism: Secondary | ICD-10-CM | POA: Diagnosis not present

## 2016-11-14 DIAGNOSIS — E1142 Type 2 diabetes mellitus with diabetic polyneuropathy: Secondary | ICD-10-CM | POA: Diagnosis not present

## 2016-11-14 DIAGNOSIS — I10 Essential (primary) hypertension: Secondary | ICD-10-CM | POA: Diagnosis not present

## 2016-11-14 DIAGNOSIS — S93402D Sprain of unspecified ligament of left ankle, subsequent encounter: Secondary | ICD-10-CM | POA: Diagnosis not present

## 2016-11-14 DIAGNOSIS — J449 Chronic obstructive pulmonary disease, unspecified: Secondary | ICD-10-CM | POA: Diagnosis not present

## 2016-11-14 DIAGNOSIS — I2510 Atherosclerotic heart disease of native coronary artery without angina pectoris: Secondary | ICD-10-CM | POA: Diagnosis not present

## 2016-11-14 DIAGNOSIS — S61215D Laceration without foreign body of left ring finger without damage to nail, subsequent encounter: Secondary | ICD-10-CM | POA: Diagnosis not present

## 2016-11-17 DIAGNOSIS — J449 Chronic obstructive pulmonary disease, unspecified: Secondary | ICD-10-CM | POA: Diagnosis not present

## 2016-11-17 DIAGNOSIS — S93402D Sprain of unspecified ligament of left ankle, subsequent encounter: Secondary | ICD-10-CM | POA: Diagnosis not present

## 2016-11-17 DIAGNOSIS — R1013 Epigastric pain: Secondary | ICD-10-CM | POA: Diagnosis not present

## 2016-11-17 DIAGNOSIS — K2970 Gastritis, unspecified, without bleeding: Secondary | ICD-10-CM | POA: Diagnosis not present

## 2016-11-17 DIAGNOSIS — R109 Unspecified abdominal pain: Secondary | ICD-10-CM | POA: Diagnosis not present

## 2016-11-17 DIAGNOSIS — I2510 Atherosclerotic heart disease of native coronary artery without angina pectoris: Secondary | ICD-10-CM | POA: Diagnosis not present

## 2016-11-17 DIAGNOSIS — I10 Essential (primary) hypertension: Secondary | ICD-10-CM | POA: Diagnosis not present

## 2016-11-17 DIAGNOSIS — R279 Unspecified lack of coordination: Secondary | ICD-10-CM | POA: Diagnosis not present

## 2016-11-17 DIAGNOSIS — E1142 Type 2 diabetes mellitus with diabetic polyneuropathy: Secondary | ICD-10-CM | POA: Diagnosis not present

## 2016-11-17 DIAGNOSIS — J9811 Atelectasis: Secondary | ICD-10-CM | POA: Diagnosis not present

## 2016-11-17 DIAGNOSIS — Z743 Need for continuous supervision: Secondary | ICD-10-CM | POA: Diagnosis not present

## 2016-11-17 DIAGNOSIS — S61215D Laceration without foreign body of left ring finger without damage to nail, subsequent encounter: Secondary | ICD-10-CM | POA: Diagnosis not present

## 2016-11-17 DIAGNOSIS — R110 Nausea: Secondary | ICD-10-CM | POA: Diagnosis not present

## 2016-11-21 DIAGNOSIS — I10 Essential (primary) hypertension: Secondary | ICD-10-CM | POA: Diagnosis not present

## 2016-11-21 DIAGNOSIS — J449 Chronic obstructive pulmonary disease, unspecified: Secondary | ICD-10-CM | POA: Diagnosis not present

## 2016-11-21 DIAGNOSIS — S93402D Sprain of unspecified ligament of left ankle, subsequent encounter: Secondary | ICD-10-CM | POA: Diagnosis not present

## 2016-11-21 DIAGNOSIS — I2510 Atherosclerotic heart disease of native coronary artery without angina pectoris: Secondary | ICD-10-CM | POA: Diagnosis not present

## 2016-11-21 DIAGNOSIS — E1142 Type 2 diabetes mellitus with diabetic polyneuropathy: Secondary | ICD-10-CM | POA: Diagnosis not present

## 2016-11-21 DIAGNOSIS — S61215D Laceration without foreign body of left ring finger without damage to nail, subsequent encounter: Secondary | ICD-10-CM | POA: Diagnosis not present

## 2016-11-24 DIAGNOSIS — S61215D Laceration without foreign body of left ring finger without damage to nail, subsequent encounter: Secondary | ICD-10-CM | POA: Diagnosis not present

## 2016-11-24 DIAGNOSIS — J449 Chronic obstructive pulmonary disease, unspecified: Secondary | ICD-10-CM | POA: Diagnosis not present

## 2016-11-24 DIAGNOSIS — I2510 Atherosclerotic heart disease of native coronary artery without angina pectoris: Secondary | ICD-10-CM | POA: Diagnosis not present

## 2016-11-24 DIAGNOSIS — E1142 Type 2 diabetes mellitus with diabetic polyneuropathy: Secondary | ICD-10-CM | POA: Diagnosis not present

## 2016-11-24 DIAGNOSIS — I10 Essential (primary) hypertension: Secondary | ICD-10-CM | POA: Diagnosis not present

## 2016-11-24 DIAGNOSIS — S93402D Sprain of unspecified ligament of left ankle, subsequent encounter: Secondary | ICD-10-CM | POA: Diagnosis not present

## 2016-11-27 DIAGNOSIS — M7989 Other specified soft tissue disorders: Secondary | ICD-10-CM | POA: Diagnosis not present

## 2016-11-27 DIAGNOSIS — M25572 Pain in left ankle and joints of left foot: Secondary | ICD-10-CM | POA: Diagnosis not present

## 2016-11-27 DIAGNOSIS — M25579 Pain in unspecified ankle and joints of unspecified foot: Secondary | ICD-10-CM | POA: Diagnosis not present

## 2016-11-27 DIAGNOSIS — S59912A Unspecified injury of left forearm, initial encounter: Secondary | ICD-10-CM | POA: Diagnosis not present

## 2016-11-27 DIAGNOSIS — R296 Repeated falls: Secondary | ICD-10-CM | POA: Diagnosis not present

## 2016-12-02 DIAGNOSIS — I2510 Atherosclerotic heart disease of native coronary artery without angina pectoris: Secondary | ICD-10-CM | POA: Diagnosis not present

## 2016-12-02 DIAGNOSIS — E1142 Type 2 diabetes mellitus with diabetic polyneuropathy: Secondary | ICD-10-CM | POA: Diagnosis not present

## 2016-12-02 DIAGNOSIS — S93402D Sprain of unspecified ligament of left ankle, subsequent encounter: Secondary | ICD-10-CM | POA: Diagnosis not present

## 2016-12-02 DIAGNOSIS — I10 Essential (primary) hypertension: Secondary | ICD-10-CM | POA: Diagnosis not present

## 2016-12-02 DIAGNOSIS — J449 Chronic obstructive pulmonary disease, unspecified: Secondary | ICD-10-CM | POA: Diagnosis not present

## 2016-12-02 DIAGNOSIS — S61215D Laceration without foreign body of left ring finger without damage to nail, subsequent encounter: Secondary | ICD-10-CM | POA: Diagnosis not present

## 2016-12-03 DIAGNOSIS — I10 Essential (primary) hypertension: Secondary | ICD-10-CM | POA: Diagnosis not present

## 2016-12-03 DIAGNOSIS — E1142 Type 2 diabetes mellitus with diabetic polyneuropathy: Secondary | ICD-10-CM | POA: Diagnosis not present

## 2016-12-03 DIAGNOSIS — S93402D Sprain of unspecified ligament of left ankle, subsequent encounter: Secondary | ICD-10-CM | POA: Diagnosis not present

## 2016-12-03 DIAGNOSIS — J449 Chronic obstructive pulmonary disease, unspecified: Secondary | ICD-10-CM | POA: Diagnosis not present

## 2016-12-03 DIAGNOSIS — S61215D Laceration without foreign body of left ring finger without damage to nail, subsequent encounter: Secondary | ICD-10-CM | POA: Diagnosis not present

## 2016-12-03 DIAGNOSIS — I2510 Atherosclerotic heart disease of native coronary artery without angina pectoris: Secondary | ICD-10-CM | POA: Diagnosis not present

## 2016-12-05 DIAGNOSIS — I2510 Atherosclerotic heart disease of native coronary artery without angina pectoris: Secondary | ICD-10-CM | POA: Diagnosis not present

## 2016-12-05 DIAGNOSIS — E1142 Type 2 diabetes mellitus with diabetic polyneuropathy: Secondary | ICD-10-CM | POA: Diagnosis not present

## 2016-12-05 DIAGNOSIS — S61215D Laceration without foreign body of left ring finger without damage to nail, subsequent encounter: Secondary | ICD-10-CM | POA: Diagnosis not present

## 2016-12-05 DIAGNOSIS — I10 Essential (primary) hypertension: Secondary | ICD-10-CM | POA: Diagnosis not present

## 2016-12-05 DIAGNOSIS — S93402D Sprain of unspecified ligament of left ankle, subsequent encounter: Secondary | ICD-10-CM | POA: Diagnosis not present

## 2016-12-05 DIAGNOSIS — J449 Chronic obstructive pulmonary disease, unspecified: Secondary | ICD-10-CM | POA: Diagnosis not present

## 2016-12-09 DIAGNOSIS — I10 Essential (primary) hypertension: Secondary | ICD-10-CM | POA: Diagnosis not present

## 2016-12-09 DIAGNOSIS — I2510 Atherosclerotic heart disease of native coronary artery without angina pectoris: Secondary | ICD-10-CM | POA: Diagnosis not present

## 2016-12-09 DIAGNOSIS — J449 Chronic obstructive pulmonary disease, unspecified: Secondary | ICD-10-CM | POA: Diagnosis not present

## 2016-12-09 DIAGNOSIS — S93402D Sprain of unspecified ligament of left ankle, subsequent encounter: Secondary | ICD-10-CM | POA: Diagnosis not present

## 2016-12-09 DIAGNOSIS — S61215D Laceration without foreign body of left ring finger without damage to nail, subsequent encounter: Secondary | ICD-10-CM | POA: Diagnosis not present

## 2016-12-09 DIAGNOSIS — E1142 Type 2 diabetes mellitus with diabetic polyneuropathy: Secondary | ICD-10-CM | POA: Diagnosis not present

## 2016-12-11 DIAGNOSIS — E1142 Type 2 diabetes mellitus with diabetic polyneuropathy: Secondary | ICD-10-CM | POA: Diagnosis not present

## 2016-12-11 DIAGNOSIS — I10 Essential (primary) hypertension: Secondary | ICD-10-CM | POA: Diagnosis not present

## 2016-12-11 DIAGNOSIS — I2510 Atherosclerotic heart disease of native coronary artery without angina pectoris: Secondary | ICD-10-CM | POA: Diagnosis not present

## 2016-12-11 DIAGNOSIS — S61215D Laceration without foreign body of left ring finger without damage to nail, subsequent encounter: Secondary | ICD-10-CM | POA: Diagnosis not present

## 2016-12-11 DIAGNOSIS — S93402D Sprain of unspecified ligament of left ankle, subsequent encounter: Secondary | ICD-10-CM | POA: Diagnosis not present

## 2016-12-11 DIAGNOSIS — J449 Chronic obstructive pulmonary disease, unspecified: Secondary | ICD-10-CM | POA: Diagnosis not present

## 2016-12-12 DIAGNOSIS — I2510 Atherosclerotic heart disease of native coronary artery without angina pectoris: Secondary | ICD-10-CM | POA: Diagnosis not present

## 2016-12-12 DIAGNOSIS — J449 Chronic obstructive pulmonary disease, unspecified: Secondary | ICD-10-CM | POA: Diagnosis not present

## 2016-12-12 DIAGNOSIS — S93402D Sprain of unspecified ligament of left ankle, subsequent encounter: Secondary | ICD-10-CM | POA: Diagnosis not present

## 2016-12-12 DIAGNOSIS — I10 Essential (primary) hypertension: Secondary | ICD-10-CM | POA: Diagnosis not present

## 2016-12-12 DIAGNOSIS — E1142 Type 2 diabetes mellitus with diabetic polyneuropathy: Secondary | ICD-10-CM | POA: Diagnosis not present

## 2016-12-12 DIAGNOSIS — S61215D Laceration without foreign body of left ring finger without damage to nail, subsequent encounter: Secondary | ICD-10-CM | POA: Diagnosis not present

## 2016-12-15 DIAGNOSIS — S93402D Sprain of unspecified ligament of left ankle, subsequent encounter: Secondary | ICD-10-CM | POA: Diagnosis not present

## 2016-12-15 DIAGNOSIS — J449 Chronic obstructive pulmonary disease, unspecified: Secondary | ICD-10-CM | POA: Diagnosis not present

## 2016-12-15 DIAGNOSIS — S61215D Laceration without foreign body of left ring finger without damage to nail, subsequent encounter: Secondary | ICD-10-CM | POA: Diagnosis not present

## 2016-12-15 DIAGNOSIS — I2510 Atherosclerotic heart disease of native coronary artery without angina pectoris: Secondary | ICD-10-CM | POA: Diagnosis not present

## 2016-12-15 DIAGNOSIS — E1142 Type 2 diabetes mellitus with diabetic polyneuropathy: Secondary | ICD-10-CM | POA: Diagnosis not present

## 2016-12-15 DIAGNOSIS — I10 Essential (primary) hypertension: Secondary | ICD-10-CM | POA: Diagnosis not present

## 2016-12-18 DIAGNOSIS — I2510 Atherosclerotic heart disease of native coronary artery without angina pectoris: Secondary | ICD-10-CM | POA: Diagnosis not present

## 2016-12-18 DIAGNOSIS — S61215D Laceration without foreign body of left ring finger without damage to nail, subsequent encounter: Secondary | ICD-10-CM | POA: Diagnosis not present

## 2016-12-18 DIAGNOSIS — J449 Chronic obstructive pulmonary disease, unspecified: Secondary | ICD-10-CM | POA: Diagnosis not present

## 2016-12-18 DIAGNOSIS — S93402D Sprain of unspecified ligament of left ankle, subsequent encounter: Secondary | ICD-10-CM | POA: Diagnosis not present

## 2016-12-18 DIAGNOSIS — E1142 Type 2 diabetes mellitus with diabetic polyneuropathy: Secondary | ICD-10-CM | POA: Diagnosis not present

## 2016-12-18 DIAGNOSIS — I10 Essential (primary) hypertension: Secondary | ICD-10-CM | POA: Diagnosis not present

## 2016-12-19 DIAGNOSIS — E1142 Type 2 diabetes mellitus with diabetic polyneuropathy: Secondary | ICD-10-CM | POA: Diagnosis not present

## 2016-12-19 DIAGNOSIS — I10 Essential (primary) hypertension: Secondary | ICD-10-CM | POA: Diagnosis not present

## 2016-12-19 DIAGNOSIS — S61215D Laceration without foreign body of left ring finger without damage to nail, subsequent encounter: Secondary | ICD-10-CM | POA: Diagnosis not present

## 2016-12-19 DIAGNOSIS — S93402D Sprain of unspecified ligament of left ankle, subsequent encounter: Secondary | ICD-10-CM | POA: Diagnosis not present

## 2016-12-19 DIAGNOSIS — I2510 Atherosclerotic heart disease of native coronary artery without angina pectoris: Secondary | ICD-10-CM | POA: Diagnosis not present

## 2016-12-19 DIAGNOSIS — J449 Chronic obstructive pulmonary disease, unspecified: Secondary | ICD-10-CM | POA: Diagnosis not present

## 2016-12-21 DIAGNOSIS — R259 Unspecified abnormal involuntary movements: Secondary | ICD-10-CM | POA: Diagnosis not present

## 2016-12-21 DIAGNOSIS — T148XXA Other injury of unspecified body region, initial encounter: Secondary | ICD-10-CM | POA: Diagnosis not present

## 2016-12-21 DIAGNOSIS — S161XXA Strain of muscle, fascia and tendon at neck level, initial encounter: Secondary | ICD-10-CM | POA: Diagnosis not present

## 2016-12-21 DIAGNOSIS — M542 Cervicalgia: Secondary | ICD-10-CM | POA: Diagnosis not present

## 2016-12-21 DIAGNOSIS — M549 Dorsalgia, unspecified: Secondary | ICD-10-CM | POA: Diagnosis not present

## 2016-12-22 DIAGNOSIS — Z7901 Long term (current) use of anticoagulants: Secondary | ICD-10-CM | POA: Diagnosis not present

## 2016-12-22 DIAGNOSIS — I10 Essential (primary) hypertension: Secondary | ICD-10-CM | POA: Diagnosis not present

## 2016-12-22 DIAGNOSIS — Z86711 Personal history of pulmonary embolism: Secondary | ICD-10-CM | POA: Diagnosis not present

## 2016-12-22 DIAGNOSIS — S93402D Sprain of unspecified ligament of left ankle, subsequent encounter: Secondary | ICD-10-CM | POA: Diagnosis not present

## 2016-12-22 DIAGNOSIS — J449 Chronic obstructive pulmonary disease, unspecified: Secondary | ICD-10-CM | POA: Diagnosis not present

## 2016-12-22 DIAGNOSIS — M1991 Primary osteoarthritis, unspecified site: Secondary | ICD-10-CM | POA: Diagnosis not present

## 2016-12-22 DIAGNOSIS — I2510 Atherosclerotic heart disease of native coronary artery without angina pectoris: Secondary | ICD-10-CM | POA: Diagnosis not present

## 2016-12-22 DIAGNOSIS — K219 Gastro-esophageal reflux disease without esophagitis: Secondary | ICD-10-CM | POA: Diagnosis not present

## 2016-12-22 DIAGNOSIS — Z8701 Personal history of pneumonia (recurrent): Secondary | ICD-10-CM | POA: Diagnosis not present

## 2016-12-22 DIAGNOSIS — E559 Vitamin D deficiency, unspecified: Secondary | ICD-10-CM | POA: Diagnosis not present

## 2016-12-22 DIAGNOSIS — Z9181 History of falling: Secondary | ICD-10-CM | POA: Diagnosis not present

## 2016-12-22 DIAGNOSIS — M810 Age-related osteoporosis without current pathological fracture: Secondary | ICD-10-CM | POA: Diagnosis not present

## 2016-12-22 DIAGNOSIS — E1142 Type 2 diabetes mellitus with diabetic polyneuropathy: Secondary | ICD-10-CM | POA: Diagnosis not present

## 2016-12-22 DIAGNOSIS — Z8673 Personal history of transient ischemic attack (TIA), and cerebral infarction without residual deficits: Secondary | ICD-10-CM | POA: Diagnosis not present

## 2016-12-22 DIAGNOSIS — Z86718 Personal history of other venous thrombosis and embolism: Secondary | ICD-10-CM | POA: Diagnosis not present

## 2016-12-22 DIAGNOSIS — E7800 Pure hypercholesterolemia, unspecified: Secondary | ICD-10-CM | POA: Diagnosis not present

## 2016-12-23 DIAGNOSIS — S93402D Sprain of unspecified ligament of left ankle, subsequent encounter: Secondary | ICD-10-CM | POA: Diagnosis not present

## 2016-12-23 DIAGNOSIS — I10 Essential (primary) hypertension: Secondary | ICD-10-CM | POA: Diagnosis not present

## 2016-12-23 DIAGNOSIS — J449 Chronic obstructive pulmonary disease, unspecified: Secondary | ICD-10-CM | POA: Diagnosis not present

## 2016-12-23 DIAGNOSIS — I2510 Atherosclerotic heart disease of native coronary artery without angina pectoris: Secondary | ICD-10-CM | POA: Diagnosis not present

## 2016-12-23 DIAGNOSIS — M1991 Primary osteoarthritis, unspecified site: Secondary | ICD-10-CM | POA: Diagnosis not present

## 2016-12-23 DIAGNOSIS — E1142 Type 2 diabetes mellitus with diabetic polyneuropathy: Secondary | ICD-10-CM | POA: Diagnosis not present

## 2016-12-26 DIAGNOSIS — M1991 Primary osteoarthritis, unspecified site: Secondary | ICD-10-CM | POA: Diagnosis not present

## 2016-12-26 DIAGNOSIS — I2510 Atherosclerotic heart disease of native coronary artery without angina pectoris: Secondary | ICD-10-CM | POA: Diagnosis not present

## 2016-12-26 DIAGNOSIS — J449 Chronic obstructive pulmonary disease, unspecified: Secondary | ICD-10-CM | POA: Diagnosis not present

## 2016-12-26 DIAGNOSIS — E1142 Type 2 diabetes mellitus with diabetic polyneuropathy: Secondary | ICD-10-CM | POA: Diagnosis not present

## 2016-12-26 DIAGNOSIS — I10 Essential (primary) hypertension: Secondary | ICD-10-CM | POA: Diagnosis not present

## 2016-12-26 DIAGNOSIS — S93402D Sprain of unspecified ligament of left ankle, subsequent encounter: Secondary | ICD-10-CM | POA: Diagnosis not present

## 2016-12-28 DIAGNOSIS — S93402D Sprain of unspecified ligament of left ankle, subsequent encounter: Secondary | ICD-10-CM | POA: Diagnosis not present

## 2016-12-28 DIAGNOSIS — M1991 Primary osteoarthritis, unspecified site: Secondary | ICD-10-CM | POA: Diagnosis not present

## 2016-12-28 DIAGNOSIS — I2510 Atherosclerotic heart disease of native coronary artery without angina pectoris: Secondary | ICD-10-CM | POA: Diagnosis not present

## 2016-12-28 DIAGNOSIS — E1142 Type 2 diabetes mellitus with diabetic polyneuropathy: Secondary | ICD-10-CM | POA: Diagnosis not present

## 2016-12-28 DIAGNOSIS — I10 Essential (primary) hypertension: Secondary | ICD-10-CM | POA: Diagnosis not present

## 2016-12-28 DIAGNOSIS — J449 Chronic obstructive pulmonary disease, unspecified: Secondary | ICD-10-CM | POA: Diagnosis not present

## 2016-12-29 DIAGNOSIS — J449 Chronic obstructive pulmonary disease, unspecified: Secondary | ICD-10-CM | POA: Diagnosis not present

## 2016-12-29 DIAGNOSIS — M1991 Primary osteoarthritis, unspecified site: Secondary | ICD-10-CM | POA: Diagnosis not present

## 2016-12-29 DIAGNOSIS — E1142 Type 2 diabetes mellitus with diabetic polyneuropathy: Secondary | ICD-10-CM | POA: Diagnosis not present

## 2016-12-29 DIAGNOSIS — S93402D Sprain of unspecified ligament of left ankle, subsequent encounter: Secondary | ICD-10-CM | POA: Diagnosis not present

## 2016-12-29 DIAGNOSIS — I10 Essential (primary) hypertension: Secondary | ICD-10-CM | POA: Diagnosis not present

## 2016-12-29 DIAGNOSIS — I2510 Atherosclerotic heart disease of native coronary artery without angina pectoris: Secondary | ICD-10-CM | POA: Diagnosis not present

## 2017-01-02 DIAGNOSIS — M1991 Primary osteoarthritis, unspecified site: Secondary | ICD-10-CM | POA: Diagnosis not present

## 2017-01-02 DIAGNOSIS — J449 Chronic obstructive pulmonary disease, unspecified: Secondary | ICD-10-CM | POA: Diagnosis not present

## 2017-01-02 DIAGNOSIS — I2510 Atherosclerotic heart disease of native coronary artery without angina pectoris: Secondary | ICD-10-CM | POA: Diagnosis not present

## 2017-01-02 DIAGNOSIS — E1142 Type 2 diabetes mellitus with diabetic polyneuropathy: Secondary | ICD-10-CM | POA: Diagnosis not present

## 2017-01-02 DIAGNOSIS — S93402D Sprain of unspecified ligament of left ankle, subsequent encounter: Secondary | ICD-10-CM | POA: Diagnosis not present

## 2017-01-02 DIAGNOSIS — I10 Essential (primary) hypertension: Secondary | ICD-10-CM | POA: Diagnosis not present

## 2017-01-05 DIAGNOSIS — S93402D Sprain of unspecified ligament of left ankle, subsequent encounter: Secondary | ICD-10-CM | POA: Diagnosis not present

## 2017-01-05 DIAGNOSIS — E1142 Type 2 diabetes mellitus with diabetic polyneuropathy: Secondary | ICD-10-CM | POA: Diagnosis not present

## 2017-01-05 DIAGNOSIS — J449 Chronic obstructive pulmonary disease, unspecified: Secondary | ICD-10-CM | POA: Diagnosis not present

## 2017-01-05 DIAGNOSIS — I10 Essential (primary) hypertension: Secondary | ICD-10-CM | POA: Diagnosis not present

## 2017-01-05 DIAGNOSIS — I2510 Atherosclerotic heart disease of native coronary artery without angina pectoris: Secondary | ICD-10-CM | POA: Diagnosis not present

## 2017-01-05 DIAGNOSIS — M1991 Primary osteoarthritis, unspecified site: Secondary | ICD-10-CM | POA: Diagnosis not present

## 2017-01-07 DIAGNOSIS — M1991 Primary osteoarthritis, unspecified site: Secondary | ICD-10-CM | POA: Diagnosis not present

## 2017-01-07 DIAGNOSIS — I10 Essential (primary) hypertension: Secondary | ICD-10-CM | POA: Diagnosis not present

## 2017-01-07 DIAGNOSIS — I2510 Atherosclerotic heart disease of native coronary artery without angina pectoris: Secondary | ICD-10-CM | POA: Diagnosis not present

## 2017-01-07 DIAGNOSIS — S93402D Sprain of unspecified ligament of left ankle, subsequent encounter: Secondary | ICD-10-CM | POA: Diagnosis not present

## 2017-01-07 DIAGNOSIS — E1142 Type 2 diabetes mellitus with diabetic polyneuropathy: Secondary | ICD-10-CM | POA: Diagnosis not present

## 2017-01-07 DIAGNOSIS — J449 Chronic obstructive pulmonary disease, unspecified: Secondary | ICD-10-CM | POA: Diagnosis not present

## 2017-01-12 DIAGNOSIS — E1142 Type 2 diabetes mellitus with diabetic polyneuropathy: Secondary | ICD-10-CM | POA: Diagnosis not present

## 2017-01-12 DIAGNOSIS — I10 Essential (primary) hypertension: Secondary | ICD-10-CM | POA: Diagnosis not present

## 2017-01-12 DIAGNOSIS — M1991 Primary osteoarthritis, unspecified site: Secondary | ICD-10-CM | POA: Diagnosis not present

## 2017-01-12 DIAGNOSIS — I2510 Atherosclerotic heart disease of native coronary artery without angina pectoris: Secondary | ICD-10-CM | POA: Diagnosis not present

## 2017-01-12 DIAGNOSIS — J449 Chronic obstructive pulmonary disease, unspecified: Secondary | ICD-10-CM | POA: Diagnosis not present

## 2017-01-12 DIAGNOSIS — S93402D Sprain of unspecified ligament of left ankle, subsequent encounter: Secondary | ICD-10-CM | POA: Diagnosis not present

## 2017-01-14 DIAGNOSIS — J449 Chronic obstructive pulmonary disease, unspecified: Secondary | ICD-10-CM | POA: Diagnosis not present

## 2017-01-14 DIAGNOSIS — I10 Essential (primary) hypertension: Secondary | ICD-10-CM | POA: Diagnosis not present

## 2017-01-14 DIAGNOSIS — M1991 Primary osteoarthritis, unspecified site: Secondary | ICD-10-CM | POA: Diagnosis not present

## 2017-01-14 DIAGNOSIS — E1142 Type 2 diabetes mellitus with diabetic polyneuropathy: Secondary | ICD-10-CM | POA: Diagnosis not present

## 2017-01-14 DIAGNOSIS — I2510 Atherosclerotic heart disease of native coronary artery without angina pectoris: Secondary | ICD-10-CM | POA: Diagnosis not present

## 2017-01-14 DIAGNOSIS — S93402D Sprain of unspecified ligament of left ankle, subsequent encounter: Secondary | ICD-10-CM | POA: Diagnosis not present

## 2017-01-16 DIAGNOSIS — E1142 Type 2 diabetes mellitus with diabetic polyneuropathy: Secondary | ICD-10-CM | POA: Diagnosis not present

## 2017-01-16 DIAGNOSIS — S93402D Sprain of unspecified ligament of left ankle, subsequent encounter: Secondary | ICD-10-CM | POA: Diagnosis not present

## 2017-01-16 DIAGNOSIS — J449 Chronic obstructive pulmonary disease, unspecified: Secondary | ICD-10-CM | POA: Diagnosis not present

## 2017-01-16 DIAGNOSIS — I10 Essential (primary) hypertension: Secondary | ICD-10-CM | POA: Diagnosis not present

## 2017-01-16 DIAGNOSIS — I2510 Atherosclerotic heart disease of native coronary artery without angina pectoris: Secondary | ICD-10-CM | POA: Diagnosis not present

## 2017-01-16 DIAGNOSIS — M1991 Primary osteoarthritis, unspecified site: Secondary | ICD-10-CM | POA: Diagnosis not present

## 2017-01-19 DIAGNOSIS — J449 Chronic obstructive pulmonary disease, unspecified: Secondary | ICD-10-CM | POA: Diagnosis not present

## 2017-01-19 DIAGNOSIS — Z7901 Long term (current) use of anticoagulants: Secondary | ICD-10-CM | POA: Diagnosis not present

## 2017-01-19 DIAGNOSIS — I10 Essential (primary) hypertension: Secondary | ICD-10-CM | POA: Diagnosis not present

## 2017-01-19 DIAGNOSIS — R1032 Left lower quadrant pain: Secondary | ICD-10-CM | POA: Diagnosis not present

## 2017-01-19 DIAGNOSIS — E1140 Type 2 diabetes mellitus with diabetic neuropathy, unspecified: Secondary | ICD-10-CM | POA: Diagnosis not present

## 2017-01-19 DIAGNOSIS — E7800 Pure hypercholesterolemia, unspecified: Secondary | ICD-10-CM | POA: Diagnosis not present

## 2017-01-19 DIAGNOSIS — B372 Candidiasis of skin and nail: Secondary | ICD-10-CM | POA: Diagnosis not present

## 2017-01-23 DIAGNOSIS — S93402D Sprain of unspecified ligament of left ankle, subsequent encounter: Secondary | ICD-10-CM | POA: Diagnosis not present

## 2017-01-23 DIAGNOSIS — E1142 Type 2 diabetes mellitus with diabetic polyneuropathy: Secondary | ICD-10-CM | POA: Diagnosis not present

## 2017-01-23 DIAGNOSIS — J449 Chronic obstructive pulmonary disease, unspecified: Secondary | ICD-10-CM | POA: Diagnosis not present

## 2017-01-23 DIAGNOSIS — I2510 Atherosclerotic heart disease of native coronary artery without angina pectoris: Secondary | ICD-10-CM | POA: Diagnosis not present

## 2017-01-23 DIAGNOSIS — M1991 Primary osteoarthritis, unspecified site: Secondary | ICD-10-CM | POA: Diagnosis not present

## 2017-01-23 DIAGNOSIS — I10 Essential (primary) hypertension: Secondary | ICD-10-CM | POA: Diagnosis not present

## 2017-02-02 DIAGNOSIS — R531 Weakness: Secondary | ICD-10-CM | POA: Diagnosis not present

## 2017-02-02 DIAGNOSIS — R112 Nausea with vomiting, unspecified: Secondary | ICD-10-CM | POA: Diagnosis not present

## 2017-02-02 DIAGNOSIS — R0602 Shortness of breath: Secondary | ICD-10-CM | POA: Diagnosis not present

## 2017-02-02 DIAGNOSIS — R52 Pain, unspecified: Secondary | ICD-10-CM | POA: Diagnosis not present

## 2017-02-02 DIAGNOSIS — G4489 Other headache syndrome: Secondary | ICD-10-CM | POA: Diagnosis not present

## 2017-02-02 DIAGNOSIS — R51 Headache: Secondary | ICD-10-CM | POA: Diagnosis not present

## 2017-02-02 DIAGNOSIS — R079 Chest pain, unspecified: Secondary | ICD-10-CM | POA: Diagnosis not present

## 2017-02-02 DIAGNOSIS — G43909 Migraine, unspecified, not intractable, without status migrainosus: Secondary | ICD-10-CM | POA: Diagnosis not present

## 2017-02-02 DIAGNOSIS — S0990XA Unspecified injury of head, initial encounter: Secondary | ICD-10-CM | POA: Diagnosis not present

## 2017-02-04 DIAGNOSIS — I10 Essential (primary) hypertension: Secondary | ICD-10-CM | POA: Diagnosis not present

## 2017-02-04 DIAGNOSIS — M1991 Primary osteoarthritis, unspecified site: Secondary | ICD-10-CM | POA: Diagnosis not present

## 2017-02-04 DIAGNOSIS — S93402D Sprain of unspecified ligament of left ankle, subsequent encounter: Secondary | ICD-10-CM | POA: Diagnosis not present

## 2017-02-04 DIAGNOSIS — E1142 Type 2 diabetes mellitus with diabetic polyneuropathy: Secondary | ICD-10-CM | POA: Diagnosis not present

## 2017-02-04 DIAGNOSIS — J449 Chronic obstructive pulmonary disease, unspecified: Secondary | ICD-10-CM | POA: Diagnosis not present

## 2017-02-04 DIAGNOSIS — I2510 Atherosclerotic heart disease of native coronary artery without angina pectoris: Secondary | ICD-10-CM | POA: Diagnosis not present

## 2017-02-09 DIAGNOSIS — J449 Chronic obstructive pulmonary disease, unspecified: Secondary | ICD-10-CM | POA: Diagnosis not present

## 2017-02-09 DIAGNOSIS — I2510 Atherosclerotic heart disease of native coronary artery without angina pectoris: Secondary | ICD-10-CM | POA: Diagnosis not present

## 2017-02-09 DIAGNOSIS — M1991 Primary osteoarthritis, unspecified site: Secondary | ICD-10-CM | POA: Diagnosis not present

## 2017-02-09 DIAGNOSIS — S93402D Sprain of unspecified ligament of left ankle, subsequent encounter: Secondary | ICD-10-CM | POA: Diagnosis not present

## 2017-02-09 DIAGNOSIS — E1142 Type 2 diabetes mellitus with diabetic polyneuropathy: Secondary | ICD-10-CM | POA: Diagnosis not present

## 2017-02-09 DIAGNOSIS — I10 Essential (primary) hypertension: Secondary | ICD-10-CM | POA: Diagnosis not present

## 2017-02-11 DIAGNOSIS — I2510 Atherosclerotic heart disease of native coronary artery without angina pectoris: Secondary | ICD-10-CM | POA: Diagnosis not present

## 2017-02-11 DIAGNOSIS — M1991 Primary osteoarthritis, unspecified site: Secondary | ICD-10-CM | POA: Diagnosis not present

## 2017-02-11 DIAGNOSIS — I10 Essential (primary) hypertension: Secondary | ICD-10-CM | POA: Diagnosis not present

## 2017-02-11 DIAGNOSIS — E1142 Type 2 diabetes mellitus with diabetic polyneuropathy: Secondary | ICD-10-CM | POA: Diagnosis not present

## 2017-02-11 DIAGNOSIS — J449 Chronic obstructive pulmonary disease, unspecified: Secondary | ICD-10-CM | POA: Diagnosis not present

## 2017-02-11 DIAGNOSIS — S93402D Sprain of unspecified ligament of left ankle, subsequent encounter: Secondary | ICD-10-CM | POA: Diagnosis not present

## 2017-02-18 DIAGNOSIS — E1142 Type 2 diabetes mellitus with diabetic polyneuropathy: Secondary | ICD-10-CM | POA: Diagnosis not present

## 2017-02-18 DIAGNOSIS — M1991 Primary osteoarthritis, unspecified site: Secondary | ICD-10-CM | POA: Diagnosis not present

## 2017-02-18 DIAGNOSIS — I2510 Atherosclerotic heart disease of native coronary artery without angina pectoris: Secondary | ICD-10-CM | POA: Diagnosis not present

## 2017-02-18 DIAGNOSIS — J449 Chronic obstructive pulmonary disease, unspecified: Secondary | ICD-10-CM | POA: Diagnosis not present

## 2017-02-18 DIAGNOSIS — S93402D Sprain of unspecified ligament of left ankle, subsequent encounter: Secondary | ICD-10-CM | POA: Diagnosis not present

## 2017-02-18 DIAGNOSIS — I10 Essential (primary) hypertension: Secondary | ICD-10-CM | POA: Diagnosis not present

## 2017-02-20 DIAGNOSIS — S93402D Sprain of unspecified ligament of left ankle, subsequent encounter: Secondary | ICD-10-CM | POA: Diagnosis not present

## 2017-02-20 DIAGNOSIS — E1142 Type 2 diabetes mellitus with diabetic polyneuropathy: Secondary | ICD-10-CM | POA: Diagnosis not present

## 2017-02-20 DIAGNOSIS — Z8673 Personal history of transient ischemic attack (TIA), and cerebral infarction without residual deficits: Secondary | ICD-10-CM | POA: Diagnosis not present

## 2017-02-20 DIAGNOSIS — J449 Chronic obstructive pulmonary disease, unspecified: Secondary | ICD-10-CM | POA: Diagnosis not present

## 2017-02-20 DIAGNOSIS — E559 Vitamin D deficiency, unspecified: Secondary | ICD-10-CM | POA: Diagnosis not present

## 2017-02-20 DIAGNOSIS — M810 Age-related osteoporosis without current pathological fracture: Secondary | ICD-10-CM | POA: Diagnosis not present

## 2017-02-20 DIAGNOSIS — Z9181 History of falling: Secondary | ICD-10-CM | POA: Diagnosis not present

## 2017-02-20 DIAGNOSIS — L89621 Pressure ulcer of left heel, stage 1: Secondary | ICD-10-CM | POA: Diagnosis not present

## 2017-02-20 DIAGNOSIS — Z8701 Personal history of pneumonia (recurrent): Secondary | ICD-10-CM | POA: Diagnosis not present

## 2017-02-20 DIAGNOSIS — Z7901 Long term (current) use of anticoagulants: Secondary | ICD-10-CM | POA: Diagnosis not present

## 2017-02-20 DIAGNOSIS — I2510 Atherosclerotic heart disease of native coronary artery without angina pectoris: Secondary | ICD-10-CM | POA: Diagnosis not present

## 2017-02-20 DIAGNOSIS — K219 Gastro-esophageal reflux disease without esophagitis: Secondary | ICD-10-CM | POA: Diagnosis not present

## 2017-02-20 DIAGNOSIS — M1990 Unspecified osteoarthritis, unspecified site: Secondary | ICD-10-CM | POA: Diagnosis not present

## 2017-02-20 DIAGNOSIS — Z86718 Personal history of other venous thrombosis and embolism: Secondary | ICD-10-CM | POA: Diagnosis not present

## 2017-02-20 DIAGNOSIS — I10 Essential (primary) hypertension: Secondary | ICD-10-CM | POA: Diagnosis not present

## 2017-02-20 DIAGNOSIS — Z86711 Personal history of pulmonary embolism: Secondary | ICD-10-CM | POA: Diagnosis not present

## 2017-02-20 DIAGNOSIS — E7800 Pure hypercholesterolemia, unspecified: Secondary | ICD-10-CM | POA: Diagnosis not present

## 2017-02-21 DIAGNOSIS — E1142 Type 2 diabetes mellitus with diabetic polyneuropathy: Secondary | ICD-10-CM | POA: Diagnosis not present

## 2017-02-21 DIAGNOSIS — I10 Essential (primary) hypertension: Secondary | ICD-10-CM | POA: Diagnosis not present

## 2017-02-21 DIAGNOSIS — L89621 Pressure ulcer of left heel, stage 1: Secondary | ICD-10-CM | POA: Diagnosis not present

## 2017-02-21 DIAGNOSIS — J449 Chronic obstructive pulmonary disease, unspecified: Secondary | ICD-10-CM | POA: Diagnosis not present

## 2017-02-21 DIAGNOSIS — I2510 Atherosclerotic heart disease of native coronary artery without angina pectoris: Secondary | ICD-10-CM | POA: Diagnosis not present

## 2017-02-21 DIAGNOSIS — S93402D Sprain of unspecified ligament of left ankle, subsequent encounter: Secondary | ICD-10-CM | POA: Diagnosis not present

## 2017-02-23 DIAGNOSIS — L89621 Pressure ulcer of left heel, stage 1: Secondary | ICD-10-CM | POA: Diagnosis not present

## 2017-02-23 DIAGNOSIS — S93402D Sprain of unspecified ligament of left ankle, subsequent encounter: Secondary | ICD-10-CM | POA: Diagnosis not present

## 2017-02-23 DIAGNOSIS — J449 Chronic obstructive pulmonary disease, unspecified: Secondary | ICD-10-CM | POA: Diagnosis not present

## 2017-02-23 DIAGNOSIS — I2510 Atherosclerotic heart disease of native coronary artery without angina pectoris: Secondary | ICD-10-CM | POA: Diagnosis not present

## 2017-02-23 DIAGNOSIS — I10 Essential (primary) hypertension: Secondary | ICD-10-CM | POA: Diagnosis not present

## 2017-02-23 DIAGNOSIS — E1142 Type 2 diabetes mellitus with diabetic polyneuropathy: Secondary | ICD-10-CM | POA: Diagnosis not present

## 2017-02-24 DIAGNOSIS — E1142 Type 2 diabetes mellitus with diabetic polyneuropathy: Secondary | ICD-10-CM | POA: Diagnosis not present

## 2017-02-24 DIAGNOSIS — J449 Chronic obstructive pulmonary disease, unspecified: Secondary | ICD-10-CM | POA: Diagnosis not present

## 2017-02-24 DIAGNOSIS — I10 Essential (primary) hypertension: Secondary | ICD-10-CM | POA: Diagnosis not present

## 2017-02-24 DIAGNOSIS — L89621 Pressure ulcer of left heel, stage 1: Secondary | ICD-10-CM | POA: Diagnosis not present

## 2017-02-24 DIAGNOSIS — S93402D Sprain of unspecified ligament of left ankle, subsequent encounter: Secondary | ICD-10-CM | POA: Diagnosis not present

## 2017-02-24 DIAGNOSIS — I2510 Atherosclerotic heart disease of native coronary artery without angina pectoris: Secondary | ICD-10-CM | POA: Diagnosis not present

## 2017-02-25 DIAGNOSIS — L89621 Pressure ulcer of left heel, stage 1: Secondary | ICD-10-CM | POA: Diagnosis not present

## 2017-02-25 DIAGNOSIS — E1142 Type 2 diabetes mellitus with diabetic polyneuropathy: Secondary | ICD-10-CM | POA: Diagnosis not present

## 2017-02-25 DIAGNOSIS — I10 Essential (primary) hypertension: Secondary | ICD-10-CM | POA: Diagnosis not present

## 2017-02-25 DIAGNOSIS — I2510 Atherosclerotic heart disease of native coronary artery without angina pectoris: Secondary | ICD-10-CM | POA: Diagnosis not present

## 2017-02-25 DIAGNOSIS — J449 Chronic obstructive pulmonary disease, unspecified: Secondary | ICD-10-CM | POA: Diagnosis not present

## 2017-02-25 DIAGNOSIS — S93402D Sprain of unspecified ligament of left ankle, subsequent encounter: Secondary | ICD-10-CM | POA: Diagnosis not present

## 2017-02-26 DIAGNOSIS — S93402D Sprain of unspecified ligament of left ankle, subsequent encounter: Secondary | ICD-10-CM | POA: Diagnosis not present

## 2017-02-26 DIAGNOSIS — E1142 Type 2 diabetes mellitus with diabetic polyneuropathy: Secondary | ICD-10-CM | POA: Diagnosis not present

## 2017-02-26 DIAGNOSIS — I10 Essential (primary) hypertension: Secondary | ICD-10-CM | POA: Diagnosis not present

## 2017-02-26 DIAGNOSIS — L89621 Pressure ulcer of left heel, stage 1: Secondary | ICD-10-CM | POA: Diagnosis not present

## 2017-02-26 DIAGNOSIS — J449 Chronic obstructive pulmonary disease, unspecified: Secondary | ICD-10-CM | POA: Diagnosis not present

## 2017-02-26 DIAGNOSIS — I2510 Atherosclerotic heart disease of native coronary artery without angina pectoris: Secondary | ICD-10-CM | POA: Diagnosis not present

## 2017-03-04 DIAGNOSIS — E1142 Type 2 diabetes mellitus with diabetic polyneuropathy: Secondary | ICD-10-CM | POA: Diagnosis not present

## 2017-03-04 DIAGNOSIS — I10 Essential (primary) hypertension: Secondary | ICD-10-CM | POA: Diagnosis not present

## 2017-03-04 DIAGNOSIS — L89621 Pressure ulcer of left heel, stage 1: Secondary | ICD-10-CM | POA: Diagnosis not present

## 2017-03-04 DIAGNOSIS — J449 Chronic obstructive pulmonary disease, unspecified: Secondary | ICD-10-CM | POA: Diagnosis not present

## 2017-03-04 DIAGNOSIS — I2510 Atherosclerotic heart disease of native coronary artery without angina pectoris: Secondary | ICD-10-CM | POA: Diagnosis not present

## 2017-03-04 DIAGNOSIS — S93402D Sprain of unspecified ligament of left ankle, subsequent encounter: Secondary | ICD-10-CM | POA: Diagnosis not present

## 2017-03-05 DIAGNOSIS — E1140 Type 2 diabetes mellitus with diabetic neuropathy, unspecified: Secondary | ICD-10-CM | POA: Diagnosis not present

## 2017-03-05 DIAGNOSIS — L89621 Pressure ulcer of left heel, stage 1: Secondary | ICD-10-CM | POA: Diagnosis not present

## 2017-03-05 DIAGNOSIS — I10 Essential (primary) hypertension: Secondary | ICD-10-CM | POA: Diagnosis not present

## 2017-03-05 DIAGNOSIS — M810 Age-related osteoporosis without current pathological fracture: Secondary | ICD-10-CM | POA: Diagnosis not present

## 2017-03-05 DIAGNOSIS — J449 Chronic obstructive pulmonary disease, unspecified: Secondary | ICD-10-CM | POA: Diagnosis not present

## 2017-03-05 DIAGNOSIS — S93402D Sprain of unspecified ligament of left ankle, subsequent encounter: Secondary | ICD-10-CM | POA: Diagnosis not present

## 2017-03-05 DIAGNOSIS — E7800 Pure hypercholesterolemia, unspecified: Secondary | ICD-10-CM | POA: Diagnosis not present

## 2017-03-05 DIAGNOSIS — Z7901 Long term (current) use of anticoagulants: Secondary | ICD-10-CM | POA: Diagnosis not present

## 2017-03-06 DIAGNOSIS — I10 Essential (primary) hypertension: Secondary | ICD-10-CM | POA: Diagnosis not present

## 2017-03-06 DIAGNOSIS — J449 Chronic obstructive pulmonary disease, unspecified: Secondary | ICD-10-CM | POA: Diagnosis not present

## 2017-03-06 DIAGNOSIS — I2510 Atherosclerotic heart disease of native coronary artery without angina pectoris: Secondary | ICD-10-CM | POA: Diagnosis not present

## 2017-03-06 DIAGNOSIS — E1142 Type 2 diabetes mellitus with diabetic polyneuropathy: Secondary | ICD-10-CM | POA: Diagnosis not present

## 2017-03-06 DIAGNOSIS — L89621 Pressure ulcer of left heel, stage 1: Secondary | ICD-10-CM | POA: Diagnosis not present

## 2017-03-06 DIAGNOSIS — S93402D Sprain of unspecified ligament of left ankle, subsequent encounter: Secondary | ICD-10-CM | POA: Diagnosis not present

## 2017-03-09 DIAGNOSIS — L89621 Pressure ulcer of left heel, stage 1: Secondary | ICD-10-CM | POA: Diagnosis not present

## 2017-03-09 DIAGNOSIS — I2510 Atherosclerotic heart disease of native coronary artery without angina pectoris: Secondary | ICD-10-CM | POA: Diagnosis not present

## 2017-03-09 DIAGNOSIS — J449 Chronic obstructive pulmonary disease, unspecified: Secondary | ICD-10-CM | POA: Diagnosis not present

## 2017-03-09 DIAGNOSIS — S93402D Sprain of unspecified ligament of left ankle, subsequent encounter: Secondary | ICD-10-CM | POA: Diagnosis not present

## 2017-03-09 DIAGNOSIS — I10 Essential (primary) hypertension: Secondary | ICD-10-CM | POA: Diagnosis not present

## 2017-03-09 DIAGNOSIS — E1142 Type 2 diabetes mellitus with diabetic polyneuropathy: Secondary | ICD-10-CM | POA: Diagnosis not present

## 2017-03-10 DIAGNOSIS — I2510 Atherosclerotic heart disease of native coronary artery without angina pectoris: Secondary | ICD-10-CM | POA: Diagnosis not present

## 2017-03-10 DIAGNOSIS — E1142 Type 2 diabetes mellitus with diabetic polyneuropathy: Secondary | ICD-10-CM | POA: Diagnosis not present

## 2017-03-10 DIAGNOSIS — I10 Essential (primary) hypertension: Secondary | ICD-10-CM | POA: Diagnosis not present

## 2017-03-10 DIAGNOSIS — S93402D Sprain of unspecified ligament of left ankle, subsequent encounter: Secondary | ICD-10-CM | POA: Diagnosis not present

## 2017-03-10 DIAGNOSIS — J449 Chronic obstructive pulmonary disease, unspecified: Secondary | ICD-10-CM | POA: Diagnosis not present

## 2017-03-10 DIAGNOSIS — L89621 Pressure ulcer of left heel, stage 1: Secondary | ICD-10-CM | POA: Diagnosis not present

## 2017-03-11 DIAGNOSIS — S93402D Sprain of unspecified ligament of left ankle, subsequent encounter: Secondary | ICD-10-CM | POA: Diagnosis not present

## 2017-03-11 DIAGNOSIS — I10 Essential (primary) hypertension: Secondary | ICD-10-CM | POA: Diagnosis not present

## 2017-03-11 DIAGNOSIS — L89621 Pressure ulcer of left heel, stage 1: Secondary | ICD-10-CM | POA: Diagnosis not present

## 2017-03-11 DIAGNOSIS — I2510 Atherosclerotic heart disease of native coronary artery without angina pectoris: Secondary | ICD-10-CM | POA: Diagnosis not present

## 2017-03-11 DIAGNOSIS — E1142 Type 2 diabetes mellitus with diabetic polyneuropathy: Secondary | ICD-10-CM | POA: Diagnosis not present

## 2017-03-11 DIAGNOSIS — J449 Chronic obstructive pulmonary disease, unspecified: Secondary | ICD-10-CM | POA: Diagnosis not present

## 2017-03-12 DIAGNOSIS — I2510 Atherosclerotic heart disease of native coronary artery without angina pectoris: Secondary | ICD-10-CM | POA: Diagnosis not present

## 2017-03-12 DIAGNOSIS — L89621 Pressure ulcer of left heel, stage 1: Secondary | ICD-10-CM | POA: Diagnosis not present

## 2017-03-12 DIAGNOSIS — J449 Chronic obstructive pulmonary disease, unspecified: Secondary | ICD-10-CM | POA: Diagnosis not present

## 2017-03-12 DIAGNOSIS — E1142 Type 2 diabetes mellitus with diabetic polyneuropathy: Secondary | ICD-10-CM | POA: Diagnosis not present

## 2017-03-12 DIAGNOSIS — S93402D Sprain of unspecified ligament of left ankle, subsequent encounter: Secondary | ICD-10-CM | POA: Diagnosis not present

## 2017-03-12 DIAGNOSIS — I10 Essential (primary) hypertension: Secondary | ICD-10-CM | POA: Diagnosis not present

## 2017-03-16 DIAGNOSIS — L89621 Pressure ulcer of left heel, stage 1: Secondary | ICD-10-CM | POA: Diagnosis not present

## 2017-03-16 DIAGNOSIS — I2510 Atherosclerotic heart disease of native coronary artery without angina pectoris: Secondary | ICD-10-CM | POA: Diagnosis not present

## 2017-03-16 DIAGNOSIS — E1142 Type 2 diabetes mellitus with diabetic polyneuropathy: Secondary | ICD-10-CM | POA: Diagnosis not present

## 2017-03-16 DIAGNOSIS — S93402D Sprain of unspecified ligament of left ankle, subsequent encounter: Secondary | ICD-10-CM | POA: Diagnosis not present

## 2017-03-16 DIAGNOSIS — I10 Essential (primary) hypertension: Secondary | ICD-10-CM | POA: Diagnosis not present

## 2017-03-16 DIAGNOSIS — J449 Chronic obstructive pulmonary disease, unspecified: Secondary | ICD-10-CM | POA: Diagnosis not present

## 2017-03-17 DIAGNOSIS — L89621 Pressure ulcer of left heel, stage 1: Secondary | ICD-10-CM | POA: Diagnosis not present

## 2017-03-17 DIAGNOSIS — S93402D Sprain of unspecified ligament of left ankle, subsequent encounter: Secondary | ICD-10-CM | POA: Diagnosis not present

## 2017-03-17 DIAGNOSIS — J449 Chronic obstructive pulmonary disease, unspecified: Secondary | ICD-10-CM | POA: Diagnosis not present

## 2017-03-17 DIAGNOSIS — E1142 Type 2 diabetes mellitus with diabetic polyneuropathy: Secondary | ICD-10-CM | POA: Diagnosis not present

## 2017-03-17 DIAGNOSIS — I10 Essential (primary) hypertension: Secondary | ICD-10-CM | POA: Diagnosis not present

## 2017-03-17 DIAGNOSIS — I2510 Atherosclerotic heart disease of native coronary artery without angina pectoris: Secondary | ICD-10-CM | POA: Diagnosis not present

## 2017-03-19 DIAGNOSIS — S93402D Sprain of unspecified ligament of left ankle, subsequent encounter: Secondary | ICD-10-CM | POA: Diagnosis not present

## 2017-03-19 DIAGNOSIS — I2510 Atherosclerotic heart disease of native coronary artery without angina pectoris: Secondary | ICD-10-CM | POA: Diagnosis not present

## 2017-03-19 DIAGNOSIS — E1142 Type 2 diabetes mellitus with diabetic polyneuropathy: Secondary | ICD-10-CM | POA: Diagnosis not present

## 2017-03-19 DIAGNOSIS — J449 Chronic obstructive pulmonary disease, unspecified: Secondary | ICD-10-CM | POA: Diagnosis not present

## 2017-03-19 DIAGNOSIS — I10 Essential (primary) hypertension: Secondary | ICD-10-CM | POA: Diagnosis not present

## 2017-03-19 DIAGNOSIS — L89621 Pressure ulcer of left heel, stage 1: Secondary | ICD-10-CM | POA: Diagnosis not present

## 2017-03-23 DIAGNOSIS — I10 Essential (primary) hypertension: Secondary | ICD-10-CM | POA: Diagnosis not present

## 2017-03-23 DIAGNOSIS — J449 Chronic obstructive pulmonary disease, unspecified: Secondary | ICD-10-CM | POA: Diagnosis not present

## 2017-03-23 DIAGNOSIS — S93402D Sprain of unspecified ligament of left ankle, subsequent encounter: Secondary | ICD-10-CM | POA: Diagnosis not present

## 2017-03-23 DIAGNOSIS — L89621 Pressure ulcer of left heel, stage 1: Secondary | ICD-10-CM | POA: Diagnosis not present

## 2017-03-23 DIAGNOSIS — E1142 Type 2 diabetes mellitus with diabetic polyneuropathy: Secondary | ICD-10-CM | POA: Diagnosis not present

## 2017-03-23 DIAGNOSIS — I2510 Atherosclerotic heart disease of native coronary artery without angina pectoris: Secondary | ICD-10-CM | POA: Diagnosis not present

## 2017-03-24 DIAGNOSIS — J449 Chronic obstructive pulmonary disease, unspecified: Secondary | ICD-10-CM | POA: Diagnosis not present

## 2017-03-24 DIAGNOSIS — E1142 Type 2 diabetes mellitus with diabetic polyneuropathy: Secondary | ICD-10-CM | POA: Diagnosis not present

## 2017-03-24 DIAGNOSIS — L89621 Pressure ulcer of left heel, stage 1: Secondary | ICD-10-CM | POA: Diagnosis not present

## 2017-03-24 DIAGNOSIS — I10 Essential (primary) hypertension: Secondary | ICD-10-CM | POA: Diagnosis not present

## 2017-03-24 DIAGNOSIS — S93402D Sprain of unspecified ligament of left ankle, subsequent encounter: Secondary | ICD-10-CM | POA: Diagnosis not present

## 2017-03-24 DIAGNOSIS — I2510 Atherosclerotic heart disease of native coronary artery without angina pectoris: Secondary | ICD-10-CM | POA: Diagnosis not present

## 2017-03-26 DIAGNOSIS — E1142 Type 2 diabetes mellitus with diabetic polyneuropathy: Secondary | ICD-10-CM | POA: Diagnosis not present

## 2017-03-26 DIAGNOSIS — I10 Essential (primary) hypertension: Secondary | ICD-10-CM | POA: Diagnosis not present

## 2017-03-26 DIAGNOSIS — J449 Chronic obstructive pulmonary disease, unspecified: Secondary | ICD-10-CM | POA: Diagnosis not present

## 2017-03-26 DIAGNOSIS — L89621 Pressure ulcer of left heel, stage 1: Secondary | ICD-10-CM | POA: Diagnosis not present

## 2017-03-26 DIAGNOSIS — S93402D Sprain of unspecified ligament of left ankle, subsequent encounter: Secondary | ICD-10-CM | POA: Diagnosis not present

## 2017-03-26 DIAGNOSIS — I2510 Atherosclerotic heart disease of native coronary artery without angina pectoris: Secondary | ICD-10-CM | POA: Diagnosis not present

## 2017-03-30 DIAGNOSIS — J449 Chronic obstructive pulmonary disease, unspecified: Secondary | ICD-10-CM | POA: Diagnosis not present

## 2017-03-30 DIAGNOSIS — S93402D Sprain of unspecified ligament of left ankle, subsequent encounter: Secondary | ICD-10-CM | POA: Diagnosis not present

## 2017-03-30 DIAGNOSIS — I2510 Atherosclerotic heart disease of native coronary artery without angina pectoris: Secondary | ICD-10-CM | POA: Diagnosis not present

## 2017-03-30 DIAGNOSIS — I10 Essential (primary) hypertension: Secondary | ICD-10-CM | POA: Diagnosis not present

## 2017-03-30 DIAGNOSIS — L89621 Pressure ulcer of left heel, stage 1: Secondary | ICD-10-CM | POA: Diagnosis not present

## 2017-03-30 DIAGNOSIS — E1142 Type 2 diabetes mellitus with diabetic polyneuropathy: Secondary | ICD-10-CM | POA: Diagnosis not present

## 2017-03-31 DIAGNOSIS — E1142 Type 2 diabetes mellitus with diabetic polyneuropathy: Secondary | ICD-10-CM | POA: Diagnosis not present

## 2017-03-31 DIAGNOSIS — S93402D Sprain of unspecified ligament of left ankle, subsequent encounter: Secondary | ICD-10-CM | POA: Diagnosis not present

## 2017-03-31 DIAGNOSIS — L89621 Pressure ulcer of left heel, stage 1: Secondary | ICD-10-CM | POA: Diagnosis not present

## 2017-03-31 DIAGNOSIS — I2510 Atherosclerotic heart disease of native coronary artery without angina pectoris: Secondary | ICD-10-CM | POA: Diagnosis not present

## 2017-03-31 DIAGNOSIS — I10 Essential (primary) hypertension: Secondary | ICD-10-CM | POA: Diagnosis not present

## 2017-03-31 DIAGNOSIS — J449 Chronic obstructive pulmonary disease, unspecified: Secondary | ICD-10-CM | POA: Diagnosis not present

## 2017-04-02 DIAGNOSIS — L89621 Pressure ulcer of left heel, stage 1: Secondary | ICD-10-CM | POA: Diagnosis not present

## 2017-04-02 DIAGNOSIS — S93402D Sprain of unspecified ligament of left ankle, subsequent encounter: Secondary | ICD-10-CM | POA: Diagnosis not present

## 2017-04-02 DIAGNOSIS — J449 Chronic obstructive pulmonary disease, unspecified: Secondary | ICD-10-CM | POA: Diagnosis not present

## 2017-04-02 DIAGNOSIS — E1142 Type 2 diabetes mellitus with diabetic polyneuropathy: Secondary | ICD-10-CM | POA: Diagnosis not present

## 2017-04-02 DIAGNOSIS — I2510 Atherosclerotic heart disease of native coronary artery without angina pectoris: Secondary | ICD-10-CM | POA: Diagnosis not present

## 2017-04-02 DIAGNOSIS — I10 Essential (primary) hypertension: Secondary | ICD-10-CM | POA: Diagnosis not present

## 2017-04-07 DIAGNOSIS — S93402D Sprain of unspecified ligament of left ankle, subsequent encounter: Secondary | ICD-10-CM | POA: Diagnosis not present

## 2017-04-07 DIAGNOSIS — I10 Essential (primary) hypertension: Secondary | ICD-10-CM | POA: Diagnosis not present

## 2017-04-07 DIAGNOSIS — I2510 Atherosclerotic heart disease of native coronary artery without angina pectoris: Secondary | ICD-10-CM | POA: Diagnosis not present

## 2017-04-07 DIAGNOSIS — E1142 Type 2 diabetes mellitus with diabetic polyneuropathy: Secondary | ICD-10-CM | POA: Diagnosis not present

## 2017-04-07 DIAGNOSIS — L89621 Pressure ulcer of left heel, stage 1: Secondary | ICD-10-CM | POA: Diagnosis not present

## 2017-04-07 DIAGNOSIS — J449 Chronic obstructive pulmonary disease, unspecified: Secondary | ICD-10-CM | POA: Diagnosis not present

## 2017-04-09 DIAGNOSIS — E1142 Type 2 diabetes mellitus with diabetic polyneuropathy: Secondary | ICD-10-CM | POA: Diagnosis not present

## 2017-04-09 DIAGNOSIS — J449 Chronic obstructive pulmonary disease, unspecified: Secondary | ICD-10-CM | POA: Diagnosis not present

## 2017-04-09 DIAGNOSIS — I2510 Atherosclerotic heart disease of native coronary artery without angina pectoris: Secondary | ICD-10-CM | POA: Diagnosis not present

## 2017-04-09 DIAGNOSIS — L89621 Pressure ulcer of left heel, stage 1: Secondary | ICD-10-CM | POA: Diagnosis not present

## 2017-04-09 DIAGNOSIS — I10 Essential (primary) hypertension: Secondary | ICD-10-CM | POA: Diagnosis not present

## 2017-04-09 DIAGNOSIS — S93402D Sprain of unspecified ligament of left ankle, subsequent encounter: Secondary | ICD-10-CM | POA: Diagnosis not present

## 2017-04-13 DIAGNOSIS — S93402D Sprain of unspecified ligament of left ankle, subsequent encounter: Secondary | ICD-10-CM | POA: Diagnosis not present

## 2017-04-13 DIAGNOSIS — L89621 Pressure ulcer of left heel, stage 1: Secondary | ICD-10-CM | POA: Diagnosis not present

## 2017-04-13 DIAGNOSIS — I10 Essential (primary) hypertension: Secondary | ICD-10-CM | POA: Diagnosis not present

## 2017-04-13 DIAGNOSIS — J449 Chronic obstructive pulmonary disease, unspecified: Secondary | ICD-10-CM | POA: Diagnosis not present

## 2017-04-13 DIAGNOSIS — I2510 Atherosclerotic heart disease of native coronary artery without angina pectoris: Secondary | ICD-10-CM | POA: Diagnosis not present

## 2017-04-13 DIAGNOSIS — E1142 Type 2 diabetes mellitus with diabetic polyneuropathy: Secondary | ICD-10-CM | POA: Diagnosis not present

## 2017-04-20 DIAGNOSIS — J449 Chronic obstructive pulmonary disease, unspecified: Secondary | ICD-10-CM | POA: Diagnosis not present

## 2017-04-20 DIAGNOSIS — L89621 Pressure ulcer of left heel, stage 1: Secondary | ICD-10-CM | POA: Diagnosis not present

## 2017-04-20 DIAGNOSIS — E1142 Type 2 diabetes mellitus with diabetic polyneuropathy: Secondary | ICD-10-CM | POA: Diagnosis not present

## 2017-04-20 DIAGNOSIS — I2510 Atherosclerotic heart disease of native coronary artery without angina pectoris: Secondary | ICD-10-CM | POA: Diagnosis not present

## 2017-04-20 DIAGNOSIS — I10 Essential (primary) hypertension: Secondary | ICD-10-CM | POA: Diagnosis not present

## 2017-04-20 DIAGNOSIS — S93402D Sprain of unspecified ligament of left ankle, subsequent encounter: Secondary | ICD-10-CM | POA: Diagnosis not present

## 2017-04-21 DIAGNOSIS — Z7901 Long term (current) use of anticoagulants: Secondary | ICD-10-CM | POA: Diagnosis not present

## 2017-04-21 DIAGNOSIS — J449 Chronic obstructive pulmonary disease, unspecified: Secondary | ICD-10-CM | POA: Diagnosis not present

## 2017-04-21 DIAGNOSIS — Z8673 Personal history of transient ischemic attack (TIA), and cerebral infarction without residual deficits: Secondary | ICD-10-CM | POA: Diagnosis not present

## 2017-04-21 DIAGNOSIS — M1990 Unspecified osteoarthritis, unspecified site: Secondary | ICD-10-CM | POA: Diagnosis not present

## 2017-04-21 DIAGNOSIS — S93402D Sprain of unspecified ligament of left ankle, subsequent encounter: Secondary | ICD-10-CM | POA: Diagnosis not present

## 2017-04-21 DIAGNOSIS — Z86718 Personal history of other venous thrombosis and embolism: Secondary | ICD-10-CM | POA: Diagnosis not present

## 2017-04-21 DIAGNOSIS — Z86711 Personal history of pulmonary embolism: Secondary | ICD-10-CM | POA: Diagnosis not present

## 2017-04-21 DIAGNOSIS — M810 Age-related osteoporosis without current pathological fracture: Secondary | ICD-10-CM | POA: Diagnosis not present

## 2017-04-21 DIAGNOSIS — Z9181 History of falling: Secondary | ICD-10-CM | POA: Diagnosis not present

## 2017-04-21 DIAGNOSIS — K219 Gastro-esophageal reflux disease without esophagitis: Secondary | ICD-10-CM | POA: Diagnosis not present

## 2017-04-21 DIAGNOSIS — E1142 Type 2 diabetes mellitus with diabetic polyneuropathy: Secondary | ICD-10-CM | POA: Diagnosis not present

## 2017-04-21 DIAGNOSIS — Z8701 Personal history of pneumonia (recurrent): Secondary | ICD-10-CM | POA: Diagnosis not present

## 2017-04-21 DIAGNOSIS — I2510 Atherosclerotic heart disease of native coronary artery without angina pectoris: Secondary | ICD-10-CM | POA: Diagnosis not present

## 2017-04-21 DIAGNOSIS — Z9981 Dependence on supplemental oxygen: Secondary | ICD-10-CM | POA: Diagnosis not present

## 2017-04-21 DIAGNOSIS — I10 Essential (primary) hypertension: Secondary | ICD-10-CM | POA: Diagnosis not present

## 2017-04-24 DIAGNOSIS — J301 Allergic rhinitis due to pollen: Secondary | ICD-10-CM | POA: Diagnosis not present

## 2017-04-24 DIAGNOSIS — G44209 Tension-type headache, unspecified, not intractable: Secondary | ICD-10-CM | POA: Diagnosis not present

## 2017-04-24 DIAGNOSIS — I10 Essential (primary) hypertension: Secondary | ICD-10-CM | POA: Diagnosis not present

## 2017-04-27 DIAGNOSIS — I10 Essential (primary) hypertension: Secondary | ICD-10-CM | POA: Diagnosis not present

## 2017-04-27 DIAGNOSIS — E1142 Type 2 diabetes mellitus with diabetic polyneuropathy: Secondary | ICD-10-CM | POA: Diagnosis not present

## 2017-04-27 DIAGNOSIS — J449 Chronic obstructive pulmonary disease, unspecified: Secondary | ICD-10-CM | POA: Diagnosis not present

## 2017-04-27 DIAGNOSIS — S93402D Sprain of unspecified ligament of left ankle, subsequent encounter: Secondary | ICD-10-CM | POA: Diagnosis not present

## 2017-05-06 DIAGNOSIS — I2510 Atherosclerotic heart disease of native coronary artery without angina pectoris: Secondary | ICD-10-CM | POA: Diagnosis not present

## 2017-05-06 DIAGNOSIS — J449 Chronic obstructive pulmonary disease, unspecified: Secondary | ICD-10-CM | POA: Diagnosis not present

## 2017-05-06 DIAGNOSIS — S93402D Sprain of unspecified ligament of left ankle, subsequent encounter: Secondary | ICD-10-CM | POA: Diagnosis not present

## 2017-05-06 DIAGNOSIS — I10 Essential (primary) hypertension: Secondary | ICD-10-CM | POA: Diagnosis not present

## 2017-05-06 DIAGNOSIS — M1990 Unspecified osteoarthritis, unspecified site: Secondary | ICD-10-CM | POA: Diagnosis not present

## 2017-05-06 DIAGNOSIS — E1142 Type 2 diabetes mellitus with diabetic polyneuropathy: Secondary | ICD-10-CM | POA: Diagnosis not present

## 2017-05-14 DIAGNOSIS — M1990 Unspecified osteoarthritis, unspecified site: Secondary | ICD-10-CM | POA: Diagnosis not present

## 2017-05-14 DIAGNOSIS — S93402D Sprain of unspecified ligament of left ankle, subsequent encounter: Secondary | ICD-10-CM | POA: Diagnosis not present

## 2017-05-14 DIAGNOSIS — E1142 Type 2 diabetes mellitus with diabetic polyneuropathy: Secondary | ICD-10-CM | POA: Diagnosis not present

## 2017-05-14 DIAGNOSIS — I10 Essential (primary) hypertension: Secondary | ICD-10-CM | POA: Diagnosis not present

## 2017-05-14 DIAGNOSIS — I2510 Atherosclerotic heart disease of native coronary artery without angina pectoris: Secondary | ICD-10-CM | POA: Diagnosis not present

## 2017-05-14 DIAGNOSIS — J449 Chronic obstructive pulmonary disease, unspecified: Secondary | ICD-10-CM | POA: Diagnosis not present

## 2017-05-18 DIAGNOSIS — R110 Nausea: Secondary | ICD-10-CM | POA: Diagnosis not present

## 2017-05-18 DIAGNOSIS — R4181 Age-related cognitive decline: Secondary | ICD-10-CM | POA: Diagnosis not present

## 2017-05-18 DIAGNOSIS — I10 Essential (primary) hypertension: Secondary | ICD-10-CM | POA: Diagnosis not present

## 2017-05-18 DIAGNOSIS — E1140 Type 2 diabetes mellitus with diabetic neuropathy, unspecified: Secondary | ICD-10-CM | POA: Diagnosis not present

## 2017-05-18 DIAGNOSIS — E7800 Pure hypercholesterolemia, unspecified: Secondary | ICD-10-CM | POA: Diagnosis not present

## 2017-05-21 DIAGNOSIS — M1990 Unspecified osteoarthritis, unspecified site: Secondary | ICD-10-CM | POA: Diagnosis not present

## 2017-05-21 DIAGNOSIS — J449 Chronic obstructive pulmonary disease, unspecified: Secondary | ICD-10-CM | POA: Diagnosis not present

## 2017-05-21 DIAGNOSIS — I2510 Atherosclerotic heart disease of native coronary artery without angina pectoris: Secondary | ICD-10-CM | POA: Diagnosis not present

## 2017-05-21 DIAGNOSIS — S93402D Sprain of unspecified ligament of left ankle, subsequent encounter: Secondary | ICD-10-CM | POA: Diagnosis not present

## 2017-05-21 DIAGNOSIS — E1142 Type 2 diabetes mellitus with diabetic polyneuropathy: Secondary | ICD-10-CM | POA: Diagnosis not present

## 2017-05-21 DIAGNOSIS — I10 Essential (primary) hypertension: Secondary | ICD-10-CM | POA: Diagnosis not present

## 2017-06-01 DIAGNOSIS — I2510 Atherosclerotic heart disease of native coronary artery without angina pectoris: Secondary | ICD-10-CM | POA: Diagnosis not present

## 2017-06-01 DIAGNOSIS — M1990 Unspecified osteoarthritis, unspecified site: Secondary | ICD-10-CM | POA: Diagnosis not present

## 2017-06-01 DIAGNOSIS — S93402D Sprain of unspecified ligament of left ankle, subsequent encounter: Secondary | ICD-10-CM | POA: Diagnosis not present

## 2017-06-01 DIAGNOSIS — I10 Essential (primary) hypertension: Secondary | ICD-10-CM | POA: Diagnosis not present

## 2017-06-01 DIAGNOSIS — J449 Chronic obstructive pulmonary disease, unspecified: Secondary | ICD-10-CM | POA: Diagnosis not present

## 2017-06-01 DIAGNOSIS — E1142 Type 2 diabetes mellitus with diabetic polyneuropathy: Secondary | ICD-10-CM | POA: Diagnosis not present

## 2017-06-05 DIAGNOSIS — J449 Chronic obstructive pulmonary disease, unspecified: Secondary | ICD-10-CM | POA: Diagnosis not present

## 2017-06-05 DIAGNOSIS — M1990 Unspecified osteoarthritis, unspecified site: Secondary | ICD-10-CM | POA: Diagnosis not present

## 2017-06-05 DIAGNOSIS — I10 Essential (primary) hypertension: Secondary | ICD-10-CM | POA: Diagnosis not present

## 2017-06-05 DIAGNOSIS — S93402D Sprain of unspecified ligament of left ankle, subsequent encounter: Secondary | ICD-10-CM | POA: Diagnosis not present

## 2017-06-05 DIAGNOSIS — E1142 Type 2 diabetes mellitus with diabetic polyneuropathy: Secondary | ICD-10-CM | POA: Diagnosis not present

## 2017-06-05 DIAGNOSIS — I2510 Atherosclerotic heart disease of native coronary artery without angina pectoris: Secondary | ICD-10-CM | POA: Diagnosis not present

## 2017-06-10 DIAGNOSIS — E1142 Type 2 diabetes mellitus with diabetic polyneuropathy: Secondary | ICD-10-CM | POA: Diagnosis not present

## 2017-06-10 DIAGNOSIS — I2510 Atherosclerotic heart disease of native coronary artery without angina pectoris: Secondary | ICD-10-CM | POA: Diagnosis not present

## 2017-06-10 DIAGNOSIS — J449 Chronic obstructive pulmonary disease, unspecified: Secondary | ICD-10-CM | POA: Diagnosis not present

## 2017-06-10 DIAGNOSIS — S93402D Sprain of unspecified ligament of left ankle, subsequent encounter: Secondary | ICD-10-CM | POA: Diagnosis not present

## 2017-06-10 DIAGNOSIS — I10 Essential (primary) hypertension: Secondary | ICD-10-CM | POA: Diagnosis not present

## 2017-06-10 DIAGNOSIS — M1990 Unspecified osteoarthritis, unspecified site: Secondary | ICD-10-CM | POA: Diagnosis not present

## 2017-06-17 DIAGNOSIS — E1142 Type 2 diabetes mellitus with diabetic polyneuropathy: Secondary | ICD-10-CM | POA: Diagnosis not present

## 2017-06-17 DIAGNOSIS — M1990 Unspecified osteoarthritis, unspecified site: Secondary | ICD-10-CM | POA: Diagnosis not present

## 2017-06-17 DIAGNOSIS — I10 Essential (primary) hypertension: Secondary | ICD-10-CM | POA: Diagnosis not present

## 2017-06-17 DIAGNOSIS — J449 Chronic obstructive pulmonary disease, unspecified: Secondary | ICD-10-CM | POA: Diagnosis not present

## 2017-06-17 DIAGNOSIS — S93402D Sprain of unspecified ligament of left ankle, subsequent encounter: Secondary | ICD-10-CM | POA: Diagnosis not present

## 2017-06-17 DIAGNOSIS — I2510 Atherosclerotic heart disease of native coronary artery without angina pectoris: Secondary | ICD-10-CM | POA: Diagnosis not present

## 2017-06-20 DIAGNOSIS — Z9981 Dependence on supplemental oxygen: Secondary | ICD-10-CM | POA: Diagnosis not present

## 2017-06-20 DIAGNOSIS — M1990 Unspecified osteoarthritis, unspecified site: Secondary | ICD-10-CM | POA: Diagnosis not present

## 2017-06-20 DIAGNOSIS — J449 Chronic obstructive pulmonary disease, unspecified: Secondary | ICD-10-CM | POA: Diagnosis not present

## 2017-06-20 DIAGNOSIS — Z86718 Personal history of other venous thrombosis and embolism: Secondary | ICD-10-CM | POA: Diagnosis not present

## 2017-06-20 DIAGNOSIS — Z8701 Personal history of pneumonia (recurrent): Secondary | ICD-10-CM | POA: Diagnosis not present

## 2017-06-20 DIAGNOSIS — I10 Essential (primary) hypertension: Secondary | ICD-10-CM | POA: Diagnosis not present

## 2017-06-20 DIAGNOSIS — Z7901 Long term (current) use of anticoagulants: Secondary | ICD-10-CM | POA: Diagnosis not present

## 2017-06-20 DIAGNOSIS — S93402D Sprain of unspecified ligament of left ankle, subsequent encounter: Secondary | ICD-10-CM | POA: Diagnosis not present

## 2017-06-20 DIAGNOSIS — M810 Age-related osteoporosis without current pathological fracture: Secondary | ICD-10-CM | POA: Diagnosis not present

## 2017-06-20 DIAGNOSIS — Z9181 History of falling: Secondary | ICD-10-CM | POA: Diagnosis not present

## 2017-06-20 DIAGNOSIS — I2510 Atherosclerotic heart disease of native coronary artery without angina pectoris: Secondary | ICD-10-CM | POA: Diagnosis not present

## 2017-06-20 DIAGNOSIS — K219 Gastro-esophageal reflux disease without esophagitis: Secondary | ICD-10-CM | POA: Diagnosis not present

## 2017-06-20 DIAGNOSIS — E1142 Type 2 diabetes mellitus with diabetic polyneuropathy: Secondary | ICD-10-CM | POA: Diagnosis not present

## 2017-06-20 DIAGNOSIS — Z8673 Personal history of transient ischemic attack (TIA), and cerebral infarction without residual deficits: Secondary | ICD-10-CM | POA: Diagnosis not present

## 2017-06-20 DIAGNOSIS — Z86711 Personal history of pulmonary embolism: Secondary | ICD-10-CM | POA: Diagnosis not present

## 2017-06-25 DIAGNOSIS — E1142 Type 2 diabetes mellitus with diabetic polyneuropathy: Secondary | ICD-10-CM | POA: Diagnosis not present

## 2017-06-25 DIAGNOSIS — I10 Essential (primary) hypertension: Secondary | ICD-10-CM | POA: Diagnosis not present

## 2017-06-25 DIAGNOSIS — J449 Chronic obstructive pulmonary disease, unspecified: Secondary | ICD-10-CM | POA: Diagnosis not present

## 2017-06-25 DIAGNOSIS — M1990 Unspecified osteoarthritis, unspecified site: Secondary | ICD-10-CM | POA: Diagnosis not present

## 2017-06-25 DIAGNOSIS — I2510 Atherosclerotic heart disease of native coronary artery without angina pectoris: Secondary | ICD-10-CM | POA: Diagnosis not present

## 2017-06-25 DIAGNOSIS — S93402D Sprain of unspecified ligament of left ankle, subsequent encounter: Secondary | ICD-10-CM | POA: Diagnosis not present

## 2017-06-30 DIAGNOSIS — S93402D Sprain of unspecified ligament of left ankle, subsequent encounter: Secondary | ICD-10-CM | POA: Diagnosis not present

## 2017-06-30 DIAGNOSIS — M1990 Unspecified osteoarthritis, unspecified site: Secondary | ICD-10-CM | POA: Diagnosis not present

## 2017-06-30 DIAGNOSIS — I10 Essential (primary) hypertension: Secondary | ICD-10-CM | POA: Diagnosis not present

## 2017-06-30 DIAGNOSIS — J449 Chronic obstructive pulmonary disease, unspecified: Secondary | ICD-10-CM | POA: Diagnosis not present

## 2017-06-30 DIAGNOSIS — I2510 Atherosclerotic heart disease of native coronary artery without angina pectoris: Secondary | ICD-10-CM | POA: Diagnosis not present

## 2017-06-30 DIAGNOSIS — E1142 Type 2 diabetes mellitus with diabetic polyneuropathy: Secondary | ICD-10-CM | POA: Diagnosis not present

## 2017-07-09 DIAGNOSIS — I10 Essential (primary) hypertension: Secondary | ICD-10-CM | POA: Diagnosis not present

## 2017-07-09 DIAGNOSIS — I2510 Atherosclerotic heart disease of native coronary artery without angina pectoris: Secondary | ICD-10-CM | POA: Diagnosis not present

## 2017-07-09 DIAGNOSIS — S93402D Sprain of unspecified ligament of left ankle, subsequent encounter: Secondary | ICD-10-CM | POA: Diagnosis not present

## 2017-07-09 DIAGNOSIS — J449 Chronic obstructive pulmonary disease, unspecified: Secondary | ICD-10-CM | POA: Diagnosis not present

## 2017-07-09 DIAGNOSIS — M1990 Unspecified osteoarthritis, unspecified site: Secondary | ICD-10-CM | POA: Diagnosis not present

## 2017-07-09 DIAGNOSIS — E1142 Type 2 diabetes mellitus with diabetic polyneuropathy: Secondary | ICD-10-CM | POA: Diagnosis not present

## 2017-07-14 DIAGNOSIS — I2510 Atherosclerotic heart disease of native coronary artery without angina pectoris: Secondary | ICD-10-CM | POA: Diagnosis not present

## 2017-07-14 DIAGNOSIS — I10 Essential (primary) hypertension: Secondary | ICD-10-CM | POA: Diagnosis not present

## 2017-07-14 DIAGNOSIS — S93402D Sprain of unspecified ligament of left ankle, subsequent encounter: Secondary | ICD-10-CM | POA: Diagnosis not present

## 2017-07-14 DIAGNOSIS — E1142 Type 2 diabetes mellitus with diabetic polyneuropathy: Secondary | ICD-10-CM | POA: Diagnosis not present

## 2017-07-14 DIAGNOSIS — J449 Chronic obstructive pulmonary disease, unspecified: Secondary | ICD-10-CM | POA: Diagnosis not present

## 2017-07-14 DIAGNOSIS — M1990 Unspecified osteoarthritis, unspecified site: Secondary | ICD-10-CM | POA: Diagnosis not present

## 2017-07-21 DIAGNOSIS — M1990 Unspecified osteoarthritis, unspecified site: Secondary | ICD-10-CM | POA: Diagnosis not present

## 2017-07-21 DIAGNOSIS — J449 Chronic obstructive pulmonary disease, unspecified: Secondary | ICD-10-CM | POA: Diagnosis not present

## 2017-07-21 DIAGNOSIS — I10 Essential (primary) hypertension: Secondary | ICD-10-CM | POA: Diagnosis not present

## 2017-07-21 DIAGNOSIS — I2510 Atherosclerotic heart disease of native coronary artery without angina pectoris: Secondary | ICD-10-CM | POA: Diagnosis not present

## 2017-07-21 DIAGNOSIS — S93402D Sprain of unspecified ligament of left ankle, subsequent encounter: Secondary | ICD-10-CM | POA: Diagnosis not present

## 2017-07-21 DIAGNOSIS — E1142 Type 2 diabetes mellitus with diabetic polyneuropathy: Secondary | ICD-10-CM | POA: Diagnosis not present

## 2017-07-28 DIAGNOSIS — M1990 Unspecified osteoarthritis, unspecified site: Secondary | ICD-10-CM | POA: Diagnosis not present

## 2017-07-28 DIAGNOSIS — I2510 Atherosclerotic heart disease of native coronary artery without angina pectoris: Secondary | ICD-10-CM | POA: Diagnosis not present

## 2017-07-28 DIAGNOSIS — J449 Chronic obstructive pulmonary disease, unspecified: Secondary | ICD-10-CM | POA: Diagnosis not present

## 2017-07-28 DIAGNOSIS — E1142 Type 2 diabetes mellitus with diabetic polyneuropathy: Secondary | ICD-10-CM | POA: Diagnosis not present

## 2017-07-28 DIAGNOSIS — I10 Essential (primary) hypertension: Secondary | ICD-10-CM | POA: Diagnosis not present

## 2017-07-28 DIAGNOSIS — S93402D Sprain of unspecified ligament of left ankle, subsequent encounter: Secondary | ICD-10-CM | POA: Diagnosis not present

## 2017-08-05 DIAGNOSIS — I10 Essential (primary) hypertension: Secondary | ICD-10-CM | POA: Diagnosis not present

## 2017-08-05 DIAGNOSIS — E1142 Type 2 diabetes mellitus with diabetic polyneuropathy: Secondary | ICD-10-CM | POA: Diagnosis not present

## 2017-08-05 DIAGNOSIS — S93402D Sprain of unspecified ligament of left ankle, subsequent encounter: Secondary | ICD-10-CM | POA: Diagnosis not present

## 2017-08-05 DIAGNOSIS — J449 Chronic obstructive pulmonary disease, unspecified: Secondary | ICD-10-CM | POA: Diagnosis not present

## 2017-08-05 DIAGNOSIS — I2510 Atherosclerotic heart disease of native coronary artery without angina pectoris: Secondary | ICD-10-CM | POA: Diagnosis not present

## 2017-08-05 DIAGNOSIS — M1990 Unspecified osteoarthritis, unspecified site: Secondary | ICD-10-CM | POA: Diagnosis not present

## 2017-08-11 DIAGNOSIS — I10 Essential (primary) hypertension: Secondary | ICD-10-CM | POA: Diagnosis not present

## 2017-08-11 DIAGNOSIS — I2510 Atherosclerotic heart disease of native coronary artery without angina pectoris: Secondary | ICD-10-CM | POA: Diagnosis not present

## 2017-08-11 DIAGNOSIS — S93402D Sprain of unspecified ligament of left ankle, subsequent encounter: Secondary | ICD-10-CM | POA: Diagnosis not present

## 2017-08-11 DIAGNOSIS — J449 Chronic obstructive pulmonary disease, unspecified: Secondary | ICD-10-CM | POA: Diagnosis not present

## 2017-08-11 DIAGNOSIS — M1990 Unspecified osteoarthritis, unspecified site: Secondary | ICD-10-CM | POA: Diagnosis not present

## 2017-08-11 DIAGNOSIS — E1142 Type 2 diabetes mellitus with diabetic polyneuropathy: Secondary | ICD-10-CM | POA: Diagnosis not present

## 2017-08-18 DIAGNOSIS — I2510 Atherosclerotic heart disease of native coronary artery without angina pectoris: Secondary | ICD-10-CM | POA: Diagnosis not present

## 2017-08-18 DIAGNOSIS — I10 Essential (primary) hypertension: Secondary | ICD-10-CM | POA: Diagnosis not present

## 2017-08-18 DIAGNOSIS — S93402D Sprain of unspecified ligament of left ankle, subsequent encounter: Secondary | ICD-10-CM | POA: Diagnosis not present

## 2017-08-18 DIAGNOSIS — J449 Chronic obstructive pulmonary disease, unspecified: Secondary | ICD-10-CM | POA: Diagnosis not present

## 2017-08-18 DIAGNOSIS — E1142 Type 2 diabetes mellitus with diabetic polyneuropathy: Secondary | ICD-10-CM | POA: Diagnosis not present

## 2017-08-18 DIAGNOSIS — M1990 Unspecified osteoarthritis, unspecified site: Secondary | ICD-10-CM | POA: Diagnosis not present

## 2017-09-02 DIAGNOSIS — R011 Cardiac murmur, unspecified: Secondary | ICD-10-CM | POA: Diagnosis not present

## 2017-09-02 DIAGNOSIS — E785 Hyperlipidemia, unspecified: Secondary | ICD-10-CM | POA: Diagnosis not present

## 2017-09-02 DIAGNOSIS — D509 Iron deficiency anemia, unspecified: Secondary | ICD-10-CM | POA: Diagnosis not present

## 2017-09-02 DIAGNOSIS — R634 Abnormal weight loss: Secondary | ICD-10-CM | POA: Diagnosis not present

## 2017-09-02 DIAGNOSIS — I10 Essential (primary) hypertension: Secondary | ICD-10-CM | POA: Diagnosis not present

## 2017-09-02 DIAGNOSIS — K219 Gastro-esophageal reflux disease without esophagitis: Secondary | ICD-10-CM | POA: Diagnosis not present

## 2017-09-02 DIAGNOSIS — D519 Vitamin B12 deficiency anemia, unspecified: Secondary | ICD-10-CM | POA: Diagnosis not present

## 2017-10-27 DIAGNOSIS — Z23 Encounter for immunization: Secondary | ICD-10-CM | POA: Diagnosis not present

## 2017-11-12 DIAGNOSIS — Z136 Encounter for screening for cardiovascular disorders: Secondary | ICD-10-CM | POA: Diagnosis not present

## 2017-11-12 DIAGNOSIS — Z1231 Encounter for screening mammogram for malignant neoplasm of breast: Secondary | ICD-10-CM | POA: Diagnosis not present

## 2017-11-12 DIAGNOSIS — Z0000 Encounter for general adult medical examination without abnormal findings: Secondary | ICD-10-CM | POA: Diagnosis not present

## 2017-11-12 DIAGNOSIS — Z1211 Encounter for screening for malignant neoplasm of colon: Secondary | ICD-10-CM | POA: Diagnosis not present

## 2017-11-12 DIAGNOSIS — R600 Localized edema: Secondary | ICD-10-CM | POA: Diagnosis not present

## 2017-11-12 DIAGNOSIS — J302 Other seasonal allergic rhinitis: Secondary | ICD-10-CM | POA: Diagnosis not present

## 2017-11-12 DIAGNOSIS — I10 Essential (primary) hypertension: Secondary | ICD-10-CM | POA: Diagnosis not present

## 2017-11-12 DIAGNOSIS — E782 Mixed hyperlipidemia: Secondary | ICD-10-CM | POA: Diagnosis not present

## 2017-12-15 DIAGNOSIS — R600 Localized edema: Secondary | ICD-10-CM | POA: Diagnosis not present

## 2017-12-15 DIAGNOSIS — I10 Essential (primary) hypertension: Secondary | ICD-10-CM | POA: Diagnosis not present

## 2017-12-15 DIAGNOSIS — E782 Mixed hyperlipidemia: Secondary | ICD-10-CM | POA: Diagnosis not present

## 2017-12-15 DIAGNOSIS — J209 Acute bronchitis, unspecified: Secondary | ICD-10-CM | POA: Diagnosis not present

## 2018-01-20 DIAGNOSIS — K219 Gastro-esophageal reflux disease without esophagitis: Secondary | ICD-10-CM | POA: Diagnosis not present

## 2018-01-20 DIAGNOSIS — I10 Essential (primary) hypertension: Secondary | ICD-10-CM | POA: Diagnosis not present

## 2018-01-20 DIAGNOSIS — J302 Other seasonal allergic rhinitis: Secondary | ICD-10-CM | POA: Diagnosis not present

## 2018-01-20 DIAGNOSIS — M25512 Pain in left shoulder: Secondary | ICD-10-CM | POA: Diagnosis not present

## 2018-02-25 DIAGNOSIS — J302 Other seasonal allergic rhinitis: Secondary | ICD-10-CM | POA: Diagnosis not present

## 2018-02-25 DIAGNOSIS — E782 Mixed hyperlipidemia: Secondary | ICD-10-CM | POA: Diagnosis not present

## 2018-02-25 DIAGNOSIS — K219 Gastro-esophageal reflux disease without esophagitis: Secondary | ICD-10-CM | POA: Diagnosis not present

## 2018-02-25 DIAGNOSIS — M1990 Unspecified osteoarthritis, unspecified site: Secondary | ICD-10-CM | POA: Diagnosis not present

## 2018-02-25 DIAGNOSIS — Z9114 Patient's other noncompliance with medication regimen: Secondary | ICD-10-CM | POA: Diagnosis not present

## 2018-03-01 DIAGNOSIS — I10 Essential (primary) hypertension: Secondary | ICD-10-CM | POA: Diagnosis not present

## 2018-03-01 DIAGNOSIS — M810 Age-related osteoporosis without current pathological fracture: Secondary | ICD-10-CM | POA: Diagnosis not present

## 2018-03-01 DIAGNOSIS — K219 Gastro-esophageal reflux disease without esophagitis: Secondary | ICD-10-CM | POA: Diagnosis not present

## 2018-03-01 DIAGNOSIS — Z79891 Long term (current) use of opiate analgesic: Secondary | ICD-10-CM | POA: Diagnosis not present

## 2018-03-01 DIAGNOSIS — Z8701 Personal history of pneumonia (recurrent): Secondary | ICD-10-CM | POA: Diagnosis not present

## 2018-03-01 DIAGNOSIS — D649 Anemia, unspecified: Secondary | ICD-10-CM | POA: Diagnosis not present

## 2018-03-01 DIAGNOSIS — Z9181 History of falling: Secondary | ICD-10-CM | POA: Diagnosis not present

## 2018-03-01 DIAGNOSIS — I2510 Atherosclerotic heart disease of native coronary artery without angina pectoris: Secondary | ICD-10-CM | POA: Diagnosis not present

## 2018-03-01 DIAGNOSIS — J449 Chronic obstructive pulmonary disease, unspecified: Secondary | ICD-10-CM | POA: Diagnosis not present

## 2018-03-01 DIAGNOSIS — E1142 Type 2 diabetes mellitus with diabetic polyneuropathy: Secondary | ICD-10-CM | POA: Diagnosis not present

## 2018-03-01 DIAGNOSIS — Z86718 Personal history of other venous thrombosis and embolism: Secondary | ICD-10-CM | POA: Diagnosis not present

## 2018-03-01 DIAGNOSIS — K5900 Constipation, unspecified: Secondary | ICD-10-CM | POA: Diagnosis not present

## 2018-03-01 DIAGNOSIS — E782 Mixed hyperlipidemia: Secondary | ICD-10-CM | POA: Diagnosis not present

## 2018-03-01 DIAGNOSIS — Z86711 Personal history of pulmonary embolism: Secondary | ICD-10-CM | POA: Diagnosis not present

## 2018-03-01 DIAGNOSIS — M1990 Unspecified osteoarthritis, unspecified site: Secondary | ICD-10-CM | POA: Diagnosis not present

## 2018-03-12 DIAGNOSIS — D649 Anemia, unspecified: Secondary | ICD-10-CM | POA: Diagnosis not present

## 2018-03-12 DIAGNOSIS — M810 Age-related osteoporosis without current pathological fracture: Secondary | ICD-10-CM | POA: Diagnosis not present

## 2018-03-12 DIAGNOSIS — J449 Chronic obstructive pulmonary disease, unspecified: Secondary | ICD-10-CM | POA: Diagnosis not present

## 2018-03-12 DIAGNOSIS — I2510 Atherosclerotic heart disease of native coronary artery without angina pectoris: Secondary | ICD-10-CM | POA: Diagnosis not present

## 2018-03-12 DIAGNOSIS — E1142 Type 2 diabetes mellitus with diabetic polyneuropathy: Secondary | ICD-10-CM | POA: Diagnosis not present

## 2018-03-12 DIAGNOSIS — I10 Essential (primary) hypertension: Secondary | ICD-10-CM | POA: Diagnosis not present

## 2018-03-16 DIAGNOSIS — I2510 Atherosclerotic heart disease of native coronary artery without angina pectoris: Secondary | ICD-10-CM | POA: Diagnosis not present

## 2018-03-16 DIAGNOSIS — D649 Anemia, unspecified: Secondary | ICD-10-CM | POA: Diagnosis not present

## 2018-03-16 DIAGNOSIS — I10 Essential (primary) hypertension: Secondary | ICD-10-CM | POA: Diagnosis not present

## 2018-03-16 DIAGNOSIS — E1142 Type 2 diabetes mellitus with diabetic polyneuropathy: Secondary | ICD-10-CM | POA: Diagnosis not present

## 2018-03-16 DIAGNOSIS — M810 Age-related osteoporosis without current pathological fracture: Secondary | ICD-10-CM | POA: Diagnosis not present

## 2018-03-16 DIAGNOSIS — E039 Hypothyroidism, unspecified: Secondary | ICD-10-CM | POA: Diagnosis not present

## 2018-03-16 DIAGNOSIS — J449 Chronic obstructive pulmonary disease, unspecified: Secondary | ICD-10-CM | POA: Diagnosis not present

## 2018-03-19 DIAGNOSIS — J449 Chronic obstructive pulmonary disease, unspecified: Secondary | ICD-10-CM | POA: Diagnosis not present

## 2018-03-19 DIAGNOSIS — E1142 Type 2 diabetes mellitus with diabetic polyneuropathy: Secondary | ICD-10-CM | POA: Diagnosis not present

## 2018-03-19 DIAGNOSIS — D649 Anemia, unspecified: Secondary | ICD-10-CM | POA: Diagnosis not present

## 2018-03-19 DIAGNOSIS — M810 Age-related osteoporosis without current pathological fracture: Secondary | ICD-10-CM | POA: Diagnosis not present

## 2018-03-19 DIAGNOSIS — I2510 Atherosclerotic heart disease of native coronary artery without angina pectoris: Secondary | ICD-10-CM | POA: Diagnosis not present

## 2018-03-19 DIAGNOSIS — I10 Essential (primary) hypertension: Secondary | ICD-10-CM | POA: Diagnosis not present

## 2018-03-23 DIAGNOSIS — J449 Chronic obstructive pulmonary disease, unspecified: Secondary | ICD-10-CM | POA: Diagnosis not present

## 2018-03-23 DIAGNOSIS — I10 Essential (primary) hypertension: Secondary | ICD-10-CM | POA: Diagnosis not present

## 2018-03-23 DIAGNOSIS — D649 Anemia, unspecified: Secondary | ICD-10-CM | POA: Diagnosis not present

## 2018-03-23 DIAGNOSIS — I2510 Atherosclerotic heart disease of native coronary artery without angina pectoris: Secondary | ICD-10-CM | POA: Diagnosis not present

## 2018-03-23 DIAGNOSIS — M810 Age-related osteoporosis without current pathological fracture: Secondary | ICD-10-CM | POA: Diagnosis not present

## 2018-03-23 DIAGNOSIS — E1142 Type 2 diabetes mellitus with diabetic polyneuropathy: Secondary | ICD-10-CM | POA: Diagnosis not present

## 2018-04-01 DIAGNOSIS — I2510 Atherosclerotic heart disease of native coronary artery without angina pectoris: Secondary | ICD-10-CM | POA: Diagnosis not present

## 2018-04-17 DIAGNOSIS — M1990 Unspecified osteoarthritis, unspecified site: Secondary | ICD-10-CM | POA: Diagnosis not present

## 2018-04-17 DIAGNOSIS — E782 Mixed hyperlipidemia: Secondary | ICD-10-CM | POA: Diagnosis not present

## 2018-04-17 DIAGNOSIS — I10 Essential (primary) hypertension: Secondary | ICD-10-CM | POA: Diagnosis not present

## 2018-04-17 DIAGNOSIS — R51 Headache: Secondary | ICD-10-CM | POA: Diagnosis not present

## 2018-06-12 DIAGNOSIS — R51 Headache: Secondary | ICD-10-CM | POA: Diagnosis not present

## 2018-06-12 DIAGNOSIS — I10 Essential (primary) hypertension: Secondary | ICD-10-CM | POA: Diagnosis not present

## 2018-06-12 DIAGNOSIS — K5900 Constipation, unspecified: Secondary | ICD-10-CM | POA: Diagnosis not present

## 2018-06-12 DIAGNOSIS — E782 Mixed hyperlipidemia: Secondary | ICD-10-CM | POA: Diagnosis not present

## 2018-06-16 DIAGNOSIS — R51 Headache: Secondary | ICD-10-CM | POA: Diagnosis not present

## 2018-06-16 DIAGNOSIS — Z9181 History of falling: Secondary | ICD-10-CM | POA: Diagnosis not present

## 2018-06-16 DIAGNOSIS — Z79899 Other long term (current) drug therapy: Secondary | ICD-10-CM | POA: Diagnosis not present

## 2018-07-13 DIAGNOSIS — R600 Localized edema: Secondary | ICD-10-CM | POA: Diagnosis not present

## 2018-07-13 DIAGNOSIS — M1990 Unspecified osteoarthritis, unspecified site: Secondary | ICD-10-CM | POA: Diagnosis not present

## 2018-07-13 DIAGNOSIS — R05 Cough: Secondary | ICD-10-CM | POA: Diagnosis not present

## 2018-07-13 DIAGNOSIS — I10 Essential (primary) hypertension: Secondary | ICD-10-CM | POA: Diagnosis not present

## 2018-07-24 DIAGNOSIS — B029 Zoster without complications: Secondary | ICD-10-CM | POA: Diagnosis not present

## 2018-08-13 DIAGNOSIS — J449 Chronic obstructive pulmonary disease, unspecified: Secondary | ICD-10-CM | POA: Diagnosis not present

## 2018-08-13 DIAGNOSIS — I160 Hypertensive urgency: Secondary | ICD-10-CM | POA: Diagnosis not present

## 2018-08-13 DIAGNOSIS — G4489 Other headache syndrome: Secondary | ICD-10-CM | POA: Diagnosis not present

## 2018-08-13 DIAGNOSIS — I4891 Unspecified atrial fibrillation: Secondary | ICD-10-CM | POA: Diagnosis not present

## 2018-08-13 DIAGNOSIS — E785 Hyperlipidemia, unspecified: Secondary | ICD-10-CM

## 2018-08-13 DIAGNOSIS — R51 Headache: Secondary | ICD-10-CM | POA: Diagnosis not present

## 2018-08-13 DIAGNOSIS — I10 Essential (primary) hypertension: Secondary | ICD-10-CM | POA: Diagnosis not present

## 2018-08-13 DIAGNOSIS — R531 Weakness: Secondary | ICD-10-CM | POA: Diagnosis not present

## 2018-08-14 DIAGNOSIS — I160 Hypertensive urgency: Secondary | ICD-10-CM | POA: Diagnosis present

## 2018-08-14 DIAGNOSIS — R51 Headache: Secondary | ICD-10-CM | POA: Diagnosis not present

## 2018-08-14 DIAGNOSIS — Z8709 Personal history of other diseases of the respiratory system: Secondary | ICD-10-CM | POA: Diagnosis not present

## 2018-08-14 DIAGNOSIS — I480 Paroxysmal atrial fibrillation: Secondary | ICD-10-CM | POA: Diagnosis not present

## 2018-08-14 DIAGNOSIS — Z23 Encounter for immunization: Secondary | ICD-10-CM | POA: Diagnosis not present

## 2018-08-14 DIAGNOSIS — F320 Major depressive disorder, single episode, mild: Secondary | ICD-10-CM | POA: Diagnosis not present

## 2018-08-14 DIAGNOSIS — Z888 Allergy status to other drugs, medicaments and biological substances status: Secondary | ICD-10-CM | POA: Diagnosis not present

## 2018-08-14 DIAGNOSIS — I350 Nonrheumatic aortic (valve) stenosis: Secondary | ICD-10-CM | POA: Diagnosis not present

## 2018-08-14 DIAGNOSIS — I110 Hypertensive heart disease with heart failure: Secondary | ICD-10-CM | POA: Diagnosis present

## 2018-08-14 DIAGNOSIS — I10 Essential (primary) hypertension: Secondary | ICD-10-CM | POA: Diagnosis present

## 2018-08-14 DIAGNOSIS — Z880 Allergy status to penicillin: Secondary | ICD-10-CM | POA: Diagnosis not present

## 2018-08-14 DIAGNOSIS — R531 Weakness: Secondary | ICD-10-CM | POA: Diagnosis not present

## 2018-08-14 DIAGNOSIS — Z79899 Other long term (current) drug therapy: Secondary | ICD-10-CM | POA: Diagnosis not present

## 2018-08-14 DIAGNOSIS — N179 Acute kidney failure, unspecified: Secondary | ICD-10-CM | POA: Diagnosis present

## 2018-08-14 DIAGNOSIS — G44209 Tension-type headache, unspecified, not intractable: Secondary | ICD-10-CM | POA: Diagnosis not present

## 2018-08-14 DIAGNOSIS — R7989 Other specified abnormal findings of blood chemistry: Secondary | ICD-10-CM | POA: Diagnosis not present

## 2018-08-14 DIAGNOSIS — Z8679 Personal history of other diseases of the circulatory system: Secondary | ICD-10-CM | POA: Diagnosis not present

## 2018-08-14 DIAGNOSIS — E785 Hyperlipidemia, unspecified: Secondary | ICD-10-CM | POA: Diagnosis present

## 2018-08-14 DIAGNOSIS — J449 Chronic obstructive pulmonary disease, unspecified: Secondary | ICD-10-CM | POA: Diagnosis present

## 2018-08-14 DIAGNOSIS — Z7401 Bed confinement status: Secondary | ICD-10-CM | POA: Diagnosis not present

## 2018-08-14 DIAGNOSIS — I5032 Chronic diastolic (congestive) heart failure: Secondary | ICD-10-CM | POA: Diagnosis present

## 2018-08-14 DIAGNOSIS — I252 Old myocardial infarction: Secondary | ICD-10-CM | POA: Diagnosis not present

## 2018-08-14 DIAGNOSIS — R0902 Hypoxemia: Secondary | ICD-10-CM | POA: Diagnosis not present

## 2018-08-14 DIAGNOSIS — F419 Anxiety disorder, unspecified: Secondary | ICD-10-CM | POA: Diagnosis not present

## 2018-08-14 DIAGNOSIS — I2510 Atherosclerotic heart disease of native coronary artery without angina pectoris: Secondary | ICD-10-CM | POA: Diagnosis not present

## 2018-08-14 DIAGNOSIS — J9601 Acute respiratory failure with hypoxia: Secondary | ICD-10-CM | POA: Diagnosis present

## 2018-08-14 DIAGNOSIS — I4891 Unspecified atrial fibrillation: Secondary | ICD-10-CM | POA: Diagnosis not present

## 2018-08-14 DIAGNOSIS — M1990 Unspecified osteoarthritis, unspecified site: Secondary | ICD-10-CM | POA: Diagnosis not present

## 2018-08-14 DIAGNOSIS — R7303 Prediabetes: Secondary | ICD-10-CM | POA: Diagnosis not present

## 2018-08-14 DIAGNOSIS — Z8673 Personal history of transient ischemic attack (TIA), and cerebral infarction without residual deficits: Secondary | ICD-10-CM | POA: Diagnosis not present

## 2018-08-14 DIAGNOSIS — R011 Cardiac murmur, unspecified: Secondary | ICD-10-CM | POA: Diagnosis not present

## 2018-08-14 DIAGNOSIS — I248 Other forms of acute ischemic heart disease: Secondary | ICD-10-CM | POA: Diagnosis present

## 2018-08-14 DIAGNOSIS — I517 Cardiomegaly: Secondary | ICD-10-CM | POA: Diagnosis not present

## 2018-08-14 DIAGNOSIS — K219 Gastro-esophageal reflux disease without esophagitis: Secondary | ICD-10-CM | POA: Diagnosis not present

## 2018-08-14 DIAGNOSIS — Z7982 Long term (current) use of aspirin: Secondary | ICD-10-CM | POA: Diagnosis not present

## 2018-08-14 DIAGNOSIS — Z882 Allergy status to sulfonamides status: Secondary | ICD-10-CM | POA: Diagnosis not present

## 2018-08-16 DIAGNOSIS — I517 Cardiomegaly: Secondary | ICD-10-CM

## 2018-08-16 DIAGNOSIS — I10 Essential (primary) hypertension: Secondary | ICD-10-CM

## 2018-08-17 DIAGNOSIS — I5032 Chronic diastolic (congestive) heart failure: Secondary | ICD-10-CM | POA: Diagnosis not present

## 2018-08-17 DIAGNOSIS — Z7982 Long term (current) use of aspirin: Secondary | ICD-10-CM | POA: Diagnosis not present

## 2018-08-17 DIAGNOSIS — Z882 Allergy status to sulfonamides status: Secondary | ICD-10-CM | POA: Diagnosis not present

## 2018-08-17 DIAGNOSIS — Z7401 Bed confinement status: Secondary | ICD-10-CM | POA: Diagnosis not present

## 2018-08-17 DIAGNOSIS — I160 Hypertensive urgency: Secondary | ICD-10-CM | POA: Diagnosis not present

## 2018-08-17 DIAGNOSIS — R51 Headache: Secondary | ICD-10-CM | POA: Diagnosis not present

## 2018-08-17 DIAGNOSIS — Z888 Allergy status to other drugs, medicaments and biological substances status: Secondary | ICD-10-CM | POA: Diagnosis not present

## 2018-08-17 DIAGNOSIS — Z8679 Personal history of other diseases of the circulatory system: Secondary | ICD-10-CM | POA: Diagnosis not present

## 2018-08-17 DIAGNOSIS — N179 Acute kidney failure, unspecified: Secondary | ICD-10-CM | POA: Diagnosis not present

## 2018-08-17 DIAGNOSIS — Z79899 Other long term (current) drug therapy: Secondary | ICD-10-CM | POA: Diagnosis not present

## 2018-08-17 DIAGNOSIS — G44209 Tension-type headache, unspecified, not intractable: Secondary | ICD-10-CM | POA: Diagnosis not present

## 2018-08-17 DIAGNOSIS — I480 Paroxysmal atrial fibrillation: Secondary | ICD-10-CM | POA: Diagnosis not present

## 2018-08-17 DIAGNOSIS — K219 Gastro-esophageal reflux disease without esophagitis: Secondary | ICD-10-CM | POA: Diagnosis not present

## 2018-08-17 DIAGNOSIS — Z8709 Personal history of other diseases of the respiratory system: Secondary | ICD-10-CM | POA: Diagnosis not present

## 2018-08-17 DIAGNOSIS — E785 Hyperlipidemia, unspecified: Secondary | ICD-10-CM | POA: Diagnosis not present

## 2018-08-17 DIAGNOSIS — Z8673 Personal history of transient ischemic attack (TIA), and cerebral infarction without residual deficits: Secondary | ICD-10-CM | POA: Diagnosis not present

## 2018-08-17 DIAGNOSIS — J449 Chronic obstructive pulmonary disease, unspecified: Secondary | ICD-10-CM | POA: Diagnosis not present

## 2018-08-17 DIAGNOSIS — Z880 Allergy status to penicillin: Secondary | ICD-10-CM | POA: Diagnosis not present

## 2018-08-17 DIAGNOSIS — F320 Major depressive disorder, single episode, mild: Secondary | ICD-10-CM | POA: Diagnosis not present

## 2018-08-17 DIAGNOSIS — I1310 Hypertensive heart and chronic kidney disease without heart failure, with stage 1 through stage 4 chronic kidney disease, or unspecified chronic kidney disease: Secondary | ICD-10-CM | POA: Diagnosis not present

## 2018-08-17 DIAGNOSIS — Z23 Encounter for immunization: Secondary | ICD-10-CM | POA: Diagnosis not present

## 2018-08-17 DIAGNOSIS — I252 Old myocardial infarction: Secondary | ICD-10-CM | POA: Diagnosis not present

## 2018-08-17 DIAGNOSIS — R262 Difficulty in walking, not elsewhere classified: Secondary | ICD-10-CM | POA: Diagnosis not present

## 2018-08-17 DIAGNOSIS — R7989 Other specified abnormal findings of blood chemistry: Secondary | ICD-10-CM | POA: Diagnosis not present

## 2018-08-17 DIAGNOSIS — I4891 Unspecified atrial fibrillation: Secondary | ICD-10-CM | POA: Diagnosis not present

## 2018-08-17 DIAGNOSIS — I2510 Atherosclerotic heart disease of native coronary artery without angina pectoris: Secondary | ICD-10-CM | POA: Diagnosis not present

## 2018-08-17 DIAGNOSIS — D649 Anemia, unspecified: Secondary | ICD-10-CM | POA: Diagnosis not present

## 2018-08-17 DIAGNOSIS — R0902 Hypoxemia: Secondary | ICD-10-CM | POA: Diagnosis not present

## 2018-08-17 DIAGNOSIS — I350 Nonrheumatic aortic (valve) stenosis: Secondary | ICD-10-CM | POA: Diagnosis not present

## 2018-08-17 DIAGNOSIS — I10 Essential (primary) hypertension: Secondary | ICD-10-CM | POA: Diagnosis not present

## 2018-08-17 DIAGNOSIS — J9601 Acute respiratory failure with hypoxia: Secondary | ICD-10-CM | POA: Diagnosis not present

## 2018-08-17 DIAGNOSIS — F419 Anxiety disorder, unspecified: Secondary | ICD-10-CM | POA: Diagnosis not present

## 2018-08-17 DIAGNOSIS — R7303 Prediabetes: Secondary | ICD-10-CM | POA: Diagnosis not present

## 2018-08-17 DIAGNOSIS — M1990 Unspecified osteoarthritis, unspecified site: Secondary | ICD-10-CM | POA: Diagnosis not present

## 2018-08-17 DIAGNOSIS — R011 Cardiac murmur, unspecified: Secondary | ICD-10-CM | POA: Diagnosis not present

## 2018-08-17 DIAGNOSIS — I248 Other forms of acute ischemic heart disease: Secondary | ICD-10-CM | POA: Diagnosis not present

## 2018-08-18 DIAGNOSIS — R262 Difficulty in walking, not elsewhere classified: Secondary | ICD-10-CM | POA: Diagnosis not present

## 2018-08-18 DIAGNOSIS — I1310 Hypertensive heart and chronic kidney disease without heart failure, with stage 1 through stage 4 chronic kidney disease, or unspecified chronic kidney disease: Secondary | ICD-10-CM | POA: Diagnosis not present

## 2018-08-18 DIAGNOSIS — I5032 Chronic diastolic (congestive) heart failure: Secondary | ICD-10-CM | POA: Diagnosis not present

## 2018-08-18 DIAGNOSIS — D649 Anemia, unspecified: Secondary | ICD-10-CM | POA: Diagnosis not present

## 2018-09-18 DIAGNOSIS — E782 Mixed hyperlipidemia: Secondary | ICD-10-CM | POA: Diagnosis not present

## 2018-09-18 DIAGNOSIS — I10 Essential (primary) hypertension: Secondary | ICD-10-CM | POA: Diagnosis not present

## 2018-09-18 DIAGNOSIS — J302 Other seasonal allergic rhinitis: Secondary | ICD-10-CM | POA: Diagnosis not present

## 2018-09-18 DIAGNOSIS — K219 Gastro-esophageal reflux disease without esophagitis: Secondary | ICD-10-CM | POA: Diagnosis not present

## 2018-09-18 DIAGNOSIS — R600 Localized edema: Secondary | ICD-10-CM | POA: Diagnosis not present

## 2018-09-23 ENCOUNTER — Encounter: Payer: Self-pay | Admitting: Cardiology

## 2018-09-25 DIAGNOSIS — I350 Nonrheumatic aortic (valve) stenosis: Secondary | ICD-10-CM | POA: Insufficient documentation

## 2018-09-25 NOTE — Progress Notes (Deleted)
Cardiology Office Note:    Date:  09/25/2018   ID:  [facility], DOB [birth year], MRN [id number]  PCP:  Alinda Deem, MD  Cardiologist:  Norman Herrlich, MD   Referring MD: Alinda Deem, MD  ASSESSMENT:    No diagnosis found. PLAN:    In order of problems listed above:  1. ***  Next appointment   Medication Adjustments/Labs and Tests Ordered: Current medicines are reviewed at length with the patient today.  Concerns regarding medicines are outlined above.  No orders of the defined types were placed in this encounter.  No orders of the defined types were placed in this encounter.    No chief complaint on file. ***  History of Present Illness:    [facility] is a 84 y.o. female who is being seen today for the evaluation of *** at the request of Alinda Deem, MD. She has a history of bilateral acute PE with right LE DVT in 2016, CAD, moderate aortic stenosis and stroke.  Past Medical History:  Diagnosis Date  . COPD (chronic obstructive pulmonary disease) (HCC)   . Coronary artery disease   . Diabetes mellitus (HCC)   . GERD (gastroesophageal reflux disease)   . History of stroke   . Hyperlipidemia   . Hypertension     Past Surgical History:  Procedure Laterality Date  . CHOLECYSTECTOMY      Current Medications: No outpatient medications have been marked as taking for the 09/27/18 encounter (Appointment) with Baldo Daub, MD.     Allergies:   Arlice Colt isothiocyanate]; Zestril [lisinopril]; Zithromax [azithromycin]; Codeine; and Novocain [procaine]   Social History   Socioeconomic History  . Marital status: Single    Spouse name: Not on file  . Number of children: Not on file  . Years of education: Not on file  . Highest education level: Not on file  Occupational History  . Not on file  Social Needs  . Financial resource strain: Not on file  . Food insecurity:    Worry: Not on file    Inability: Not on file  . Transportation  needs:    Medical: Not on file    Non-medical: Not on file  Tobacco Use  . Smoking status: Never Smoker  Substance and Sexual Activity  . Alcohol use: Not on file  . Drug use: Not on file  . Sexual activity: Not on file  Lifestyle  . Physical activity:    Days per week: Not on file    Minutes per session: Not on file  . Stress: Not on file  Relationships  . Social connections:    Talks on phone: Not on file    Gets together: Not on file    Attends religious service: Not on file    Active member of club or organization: Not on file    Attends meetings of clubs or organizations: Not on file    Relationship status: Not on file  Other Topics Concern  . Not on file  Social History Narrative  . Not on file     Family History: The patient's ***family history includes Hip fracture in her mother.  ROS:   ROS Please see the history of present illness.    *** All other systems reviewed and are negative.  EKGs/Labs/Other Studies Reviewed:    The following studies were reviewed today: ***  EKG:  EKG is *** ordered today.  The ekg ordered today demonstrates ***  Recent Labs: No results found  for requested labs within last 8760 hours.  Recent Lipid Panel No results found for: CHOL, TRIG, HDL, CHOLHDL, VLDL, LDLCALC, LDLDIRECT  Physical Exam:    VS:  There were no vitals taken for this visit.    Wt Readings from Last 3 Encounters:  12/16/14 189 lb 4.8 oz (85.9 kg)     GEN: *** Well nourished, well developed in no acute distress HEENT: Normal NECK: No JVD; No carotid bruits LYMPHATICS: No lymphadenopathy CARDIAC: ***RRR, no murmurs, rubs, gallops RESPIRATORY:  Clear to auscultation without rales, wheezing or rhonchi  ABDOMEN: Soft, non-tender, non-distended MUSCULOSKELETAL:  No edema; No deformity  SKIN: Warm and dry NEUROLOGIC:  Alert and oriented x 3 PSYCHIATRIC:  Normal affect     Signed, Norman Herrlich, MD  09/25/2018 11:49 AM    [facility]

## 2018-09-27 ENCOUNTER — Ambulatory Visit: Payer: Medicare Other | Admitting: Cardiology

## 2018-10-05 DIAGNOSIS — Z23 Encounter for immunization: Secondary | ICD-10-CM | POA: Diagnosis not present

## 2018-10-23 DIAGNOSIS — Z7902 Long term (current) use of antithrombotics/antiplatelets: Secondary | ICD-10-CM | POA: Diagnosis not present

## 2018-10-23 DIAGNOSIS — R600 Localized edema: Secondary | ICD-10-CM | POA: Diagnosis not present

## 2018-10-23 DIAGNOSIS — I10 Essential (primary) hypertension: Secondary | ICD-10-CM | POA: Diagnosis not present

## 2018-10-23 DIAGNOSIS — K5900 Constipation, unspecified: Secondary | ICD-10-CM | POA: Diagnosis not present

## 2018-10-23 DIAGNOSIS — E782 Mixed hyperlipidemia: Secondary | ICD-10-CM | POA: Diagnosis not present

## 2018-10-23 DIAGNOSIS — K219 Gastro-esophageal reflux disease without esophagitis: Secondary | ICD-10-CM | POA: Diagnosis not present

## 2018-11-01 DIAGNOSIS — I2510 Atherosclerotic heart disease of native coronary artery without angina pectoris: Secondary | ICD-10-CM | POA: Diagnosis not present

## 2018-11-01 DIAGNOSIS — M1990 Unspecified osteoarthritis, unspecified site: Secondary | ICD-10-CM | POA: Diagnosis not present

## 2018-11-01 DIAGNOSIS — Z885 Allergy status to narcotic agent status: Secondary | ICD-10-CM | POA: Diagnosis not present

## 2018-11-01 DIAGNOSIS — E860 Dehydration: Secondary | ICD-10-CM | POA: Diagnosis not present

## 2018-11-01 DIAGNOSIS — Z882 Allergy status to sulfonamides status: Secondary | ICD-10-CM | POA: Diagnosis not present

## 2018-11-01 DIAGNOSIS — Z7982 Long term (current) use of aspirin: Secondary | ICD-10-CM | POA: Diagnosis not present

## 2018-11-01 DIAGNOSIS — R531 Weakness: Secondary | ICD-10-CM | POA: Diagnosis not present

## 2018-11-01 DIAGNOSIS — R509 Fever, unspecified: Secondary | ICD-10-CM | POA: Diagnosis not present

## 2018-11-01 DIAGNOSIS — K219 Gastro-esophageal reflux disease without esophagitis: Secondary | ICD-10-CM | POA: Diagnosis not present

## 2018-11-01 DIAGNOSIS — R110 Nausea: Secondary | ICD-10-CM | POA: Diagnosis not present

## 2018-11-01 DIAGNOSIS — I10 Essential (primary) hypertension: Secondary | ICD-10-CM | POA: Diagnosis not present

## 2018-11-01 DIAGNOSIS — I160 Hypertensive urgency: Secondary | ICD-10-CM | POA: Diagnosis not present

## 2018-11-01 DIAGNOSIS — I480 Paroxysmal atrial fibrillation: Secondary | ICD-10-CM | POA: Diagnosis not present

## 2018-11-01 DIAGNOSIS — R0902 Hypoxemia: Secondary | ICD-10-CM | POA: Diagnosis not present

## 2018-11-01 DIAGNOSIS — I350 Nonrheumatic aortic (valve) stenosis: Secondary | ICD-10-CM | POA: Diagnosis not present

## 2018-11-01 DIAGNOSIS — Z880 Allergy status to penicillin: Secondary | ICD-10-CM | POA: Diagnosis not present

## 2018-11-01 DIAGNOSIS — E876 Hypokalemia: Secondary | ICD-10-CM | POA: Diagnosis not present

## 2018-11-01 DIAGNOSIS — J449 Chronic obstructive pulmonary disease, unspecified: Secondary | ICD-10-CM | POA: Diagnosis not present

## 2018-11-01 DIAGNOSIS — I252 Old myocardial infarction: Secondary | ICD-10-CM | POA: Diagnosis not present

## 2018-11-01 DIAGNOSIS — R011 Cardiac murmur, unspecified: Secondary | ICD-10-CM | POA: Diagnosis not present

## 2018-11-01 DIAGNOSIS — Z79899 Other long term (current) drug therapy: Secondary | ICD-10-CM | POA: Diagnosis not present

## 2018-11-01 DIAGNOSIS — F0390 Unspecified dementia without behavioral disturbance: Secondary | ICD-10-CM | POA: Diagnosis not present

## 2018-11-01 DIAGNOSIS — J9601 Acute respiratory failure with hypoxia: Secondary | ICD-10-CM | POA: Diagnosis not present

## 2018-11-01 DIAGNOSIS — E785 Hyperlipidemia, unspecified: Secondary | ICD-10-CM | POA: Diagnosis not present

## 2018-11-02 DIAGNOSIS — E876 Hypokalemia: Secondary | ICD-10-CM | POA: Diagnosis not present

## 2018-11-02 DIAGNOSIS — R509 Fever, unspecified: Secondary | ICD-10-CM | POA: Diagnosis not present

## 2018-11-02 DIAGNOSIS — J9601 Acute respiratory failure with hypoxia: Secondary | ICD-10-CM | POA: Diagnosis not present

## 2018-11-02 DIAGNOSIS — R531 Weakness: Secondary | ICD-10-CM | POA: Diagnosis not present

## 2018-11-18 ENCOUNTER — Encounter: Payer: Self-pay | Admitting: Cardiology

## 2018-11-18 ENCOUNTER — Ambulatory Visit (INDEPENDENT_AMBULATORY_CARE_PROVIDER_SITE_OTHER): Payer: Medicare Other | Admitting: Cardiology

## 2018-11-18 VITALS — BP 126/78 | HR 90 | Ht 61.0 in | Wt 163.0 lb

## 2018-11-18 DIAGNOSIS — I350 Nonrheumatic aortic (valve) stenosis: Secondary | ICD-10-CM

## 2018-11-18 NOTE — Patient Instructions (Addendum)
Medication Instructions:  Your physician recommends that you continue on your current medications as directed. Please refer to the Current Medication list given to you today.  If you need a refill on your cardiac medications before your next appointment, please call your pharmacy.   Lab work: None If you have labs (blood work) drawn today and your tests are completely normal, you will receive your results only by: . MyChart Message (if you have MyChart) OR . A paper copy in the mail If you have any lab test that is abnormal or we need to change your treatment, we will call you to review the results.  Testing/Procedures: None  Follow-Up: At [facility], you and your health needs are our priority.  As part of our continuing mission to provide you with exceptional heart care, we have created designated Provider Care Teams.  These Care Teams include your primary Cardiologist (physician) and Advanced Practice Providers (APPs -  Physician Assistants and Nurse Practitioners) who all work together to provide you with the care you need, when you need it. You will need a follow up appointment in 6 months.  Please call our office 2 months in advance to schedule this appointment.  You may see No primary care provider on file. or another member of our [facility] Provider Team in [place]: Robert Krasowski, MD . Brian Munley, MD  Any Other Special Instructions Will Be Listed Below (If Applicable).    

## 2018-11-18 NOTE — Progress Notes (Signed)
Cardiology Office Note:    Date:  11/18/2018   ID:  Heidi Jefferson, DOB 06-02-1923, MRN [id number]  PCP:  Heidi Deem, MD  Cardiologist:  Heidi Brothers, MD   Referring MD: Heidi Deem, MD    ASSESSMENT:    1. Aortic valve stenosis, etiology of cardiac valve disease unspecified    PLAN:    In order of problems listed above:  1. I am in the process of trying to obtain a copy of her echocardiogram if it was recently done at [facility] if not we will schedule her for one here.  There is a history of atrial fibrillation in the chart but clearly she is not a candidate for anticoagulation because of her frail status and gait instability.  She comes in here using a walker.  I discussed this with her at length. 2. She denies any symptoms of chest pain orthopnea PND shortness of breath or any dizziness.  We will monitor her aortic stenosis for symptoms.  She knows to go to the nearest emergency room for any concerning symptoms. 3. Patient will be seen in follow-up appointment in 6 months or earlier if the patient has any concerns 4. I told him to see her primary care doctor for her arm pain and she agrees. 5. At the time of this dictation I got a copy of her echocardiogram done in September and it reveals severe aortic stenosis.  Again she is lady who is very frail and with multiple comorbidities including advanced age.  She is completely asymptomatic with activities of daily living and I think the risks of intervention in her especially with the procedure such asTAVR are prohibitively high.  I gave her the option of going for an evaluation and she is vehemently against it.  She will let me know if she changes her mind.   Medication Adjustments/Labs and Tests Ordered: Current medicines are reviewed at length with the patient today.  Concerns regarding medicines are outlined above.  No orders of the defined types were placed in this encounter.  No orders of the defined types were  placed in this encounter.    History of Present Illness:    Heidi Jefferson is a 84 y.o. female who is being seen today for the evaluation of aortic stenosis at the request of Heidi Deem, MD.  Patient is a pleasant 84 year old female.  She is a very frail lady.  She is accompanied by her home health aide.  There is a history of coronary artery disease and pulmonary embolism in the chart.  Patient denies any problems at this time.  I reviewed hospital records and she was sent here for follow-up.  She complains of left arm pain and this seems to be aggravating her according to the aide also.  No chest pain orthopnea PND dizziness or any syncope.  At the time of my evaluation, the patient is alert awake oriented and in no distress.  Past Medical History:  Diagnosis Date  . COPD (chronic obstructive pulmonary disease) (HCC)   . Coronary artery disease   . Diabetes mellitus (HCC)   . GERD (gastroesophageal reflux disease)   . History of stroke   . Hyperlipidemia   . Hypertension     Past Surgical History:  Procedure Laterality Date  . CHOLECYSTECTOMY      Current Medications: Current Meds  Medication Sig  . amLODipine (NORVASC) 10 MG tablet Take 10 mg by mouth daily.  Marland Kitchen aspirin 81 MG tablet Take  81 mg by mouth daily.   Marland Kitchen atorvastatin (LIPITOR) 20 MG tablet Take 20 mg by mouth daily.  . Calcium Carb-Cholecalciferol (CALTRATE 600+D) 600-800 MG-UNIT TABS Take 1 tablet by mouth daily.  . cholecalciferol (VITAMIN D) 1000 UNITS tablet Take 1,000 Units by mouth daily.  . fluticasone (FLONASE) 50 MCG/ACT nasal spray Place 1-2 sprays into both nostrils daily.  . furosemide (LASIX) 40 MG tablet Take 40 mg by mouth daily.  . metoprolol tartrate (LOPRESSOR) 25 MG tablet Take 50 mg by mouth 2 (two) times daily.   . montelukast (SINGULAIR) 10 MG tablet Take 10 mg by mouth at bedtime.  . ranitidine (ZANTAC) 300 MG tablet Take 300 mg by mouth at bedtime.  . valsartan (DIOVAN) 80 MG tablet Take  80 mg by mouth daily.      Allergies:   Heidi Jefferson isothiocyanate]; Zestril [lisinopril]; Zithromax [azithromycin]; Codeine; and Novocain [procaine]   Social History   Socioeconomic History  . Marital status: Single    Spouse name: Not on file  . Number of children: Not on file  . Years of education: Not on file  . Highest education level: Not on file  Occupational History  . Not on file  Social Needs  . Financial resource strain: Not on file  . Food insecurity:    Worry: Not on file    Inability: Not on file  . Transportation needs:    Medical: Not on file    Non-medical: Not on file  Tobacco Use  . Smoking status: Never Smoker  . Smokeless tobacco: Never Used  Substance and Sexual Activity  . Alcohol use: Not on file  . Drug use: Not on file  . Sexual activity: Not on file  Lifestyle  . Physical activity:    Days per week: Not on file    Minutes per session: Not on file  . Stress: Not on file  Relationships  . Social connections:    Talks on phone: Not on file    Gets together: Not on file    Attends religious service: Not on file    Active member of club or organization: Not on file    Attends meetings of clubs or organizations: Not on file    Relationship status: Not on file  Other Topics Concern  . Not on file  Social History Narrative  . Not on file     Family History: The patient's family history includes Hip fracture in her mother.  ROS:   Please see the history of present illness.    All other systems reviewed and are negative.  EKGs/Labs/Other Studies Reviewed:    The following studies were reviewed today: I discussed my findings from [facility] records at extensive length.   Recent Labs: No results found for requested labs within last 8760 hours.  Recent Lipid Panel No results found for: CHOL, TRIG, HDL, CHOLHDL, VLDL, LDLCALC, LDLDIRECT  Physical Exam:    VS:  BP 126/78 (BP Location: Right Arm, Patient Position: Sitting, Cuff  Size: Normal)   Pulse 90   Ht 5\' 1"  (1.549 m)   Wt 163 lb (73.9 kg)   SpO2 94%   BMI 30.80 kg/m     Wt Readings from Last 3 Encounters:  11/18/18 163 lb (73.9 kg)  12/16/14 189 lb 4.8 oz (85.9 kg)     GEN: Patient is in no acute distress HEENT: Normal NECK: No JVD; No carotid bruits LYMPHATICS: No lymphadenopathy CARDIAC: S1 S2 regular, 2/6 systolic murmur at  the apex. RESPIRATORY:  Clear to auscultation without rales, wheezing or rhonchi  ABDOMEN: Soft, non-tender, non-distended MUSCULOSKELETAL:  No edema; No deformity  SKIN: Warm and dry NEUROLOGIC:  Alert and oriented x 3 PSYCHIATRIC:  Normal affect    Signed, Heidi Brothersajan R Revankar, MD  11/18/2018 11:22 AM    [facility]

## 2018-11-26 DIAGNOSIS — Z7902 Long term (current) use of antithrombotics/antiplatelets: Secondary | ICD-10-CM | POA: Diagnosis not present

## 2018-11-26 DIAGNOSIS — Z0000 Encounter for general adult medical examination without abnormal findings: Secondary | ICD-10-CM | POA: Diagnosis not present

## 2018-11-26 DIAGNOSIS — R51 Headache: Secondary | ICD-10-CM | POA: Diagnosis not present

## 2018-11-26 DIAGNOSIS — I10 Essential (primary) hypertension: Secondary | ICD-10-CM | POA: Diagnosis not present

## 2018-11-26 DIAGNOSIS — K5900 Constipation, unspecified: Secondary | ICD-10-CM | POA: Diagnosis not present

## 2018-11-26 DIAGNOSIS — R600 Localized edema: Secondary | ICD-10-CM | POA: Diagnosis not present

## 2018-11-26 DIAGNOSIS — E782 Mixed hyperlipidemia: Secondary | ICD-10-CM | POA: Diagnosis not present

## 2018-11-26 DIAGNOSIS — G894 Chronic pain syndrome: Secondary | ICD-10-CM | POA: Diagnosis not present

## 2018-12-05 ENCOUNTER — Inpatient Hospital Stay (HOSPITAL_COMMUNITY)
Admission: EM | Admit: 2018-12-05 | Discharge: 2018-12-10 | DRG: 078 | Disposition: A | Discharge status: Skilled Nursing Facility | Payer: Medicare Other | Attending: Internal Medicine | Admitting: Internal Medicine

## 2018-12-05 ENCOUNTER — Other Ambulatory Visit: Payer: Self-pay

## 2018-12-05 ENCOUNTER — Encounter (HOSPITAL_COMMUNITY): Payer: Self-pay | Admitting: Emergency Medicine

## 2018-12-05 ENCOUNTER — Emergency Department (HOSPITAL_COMMUNITY): Payer: Medicare Other

## 2018-12-05 DIAGNOSIS — T502X5A Adverse effect of carbonic-anhydrase inhibitors, benzothiadiazides and other diuretics, initial encounter: Secondary | ICD-10-CM | POA: Diagnosis not present

## 2018-12-05 DIAGNOSIS — J9811 Atelectasis: Secondary | ICD-10-CM | POA: Diagnosis present

## 2018-12-05 DIAGNOSIS — Y92230 Patient room in hospital as the place of occurrence of the external cause: Secondary | ICD-10-CM | POA: Diagnosis not present

## 2018-12-05 DIAGNOSIS — Z881 Allergy status to other antibiotic agents status: Secondary | ICD-10-CM

## 2018-12-05 DIAGNOSIS — I4891 Unspecified atrial fibrillation: Secondary | ICD-10-CM | POA: Diagnosis present

## 2018-12-05 DIAGNOSIS — I2510 Atherosclerotic heart disease of native coronary artery without angina pectoris: Secondary | ICD-10-CM | POA: Diagnosis present

## 2018-12-05 DIAGNOSIS — R402254 Coma scale, best verbal response, oriented, 24 hours or more after hospital admission: Secondary | ICD-10-CM | POA: Diagnosis not present

## 2018-12-05 DIAGNOSIS — G4489 Other headache syndrome: Secondary | ICD-10-CM | POA: Diagnosis not present

## 2018-12-05 DIAGNOSIS — Z885 Allergy status to narcotic agent status: Secondary | ICD-10-CM

## 2018-12-05 DIAGNOSIS — I674 Hypertensive encephalopathy: Secondary | ICD-10-CM | POA: Diagnosis not present

## 2018-12-05 DIAGNOSIS — I10 Essential (primary) hypertension: Secondary | ICD-10-CM | POA: Diagnosis not present

## 2018-12-05 DIAGNOSIS — Z884 Allergy status to anesthetic agent status: Secondary | ICD-10-CM

## 2018-12-05 DIAGNOSIS — E785 Hyperlipidemia, unspecified: Secondary | ICD-10-CM | POA: Diagnosis present

## 2018-12-05 DIAGNOSIS — J90 Pleural effusion, not elsewhere classified: Secondary | ICD-10-CM | POA: Diagnosis not present

## 2018-12-05 DIAGNOSIS — E119 Type 2 diabetes mellitus without complications: Secondary | ICD-10-CM | POA: Diagnosis present

## 2018-12-05 DIAGNOSIS — Z9114 Patient's other noncompliance with medication regimen: Secondary | ICD-10-CM

## 2018-12-05 DIAGNOSIS — Z8673 Personal history of transient ischemic attack (TIA), and cerebral infarction without residual deficits: Secondary | ICD-10-CM | POA: Diagnosis not present

## 2018-12-05 DIAGNOSIS — E876 Hypokalemia: Secondary | ICD-10-CM | POA: Diagnosis not present

## 2018-12-05 DIAGNOSIS — I248 Other forms of acute ischemic heart disease: Secondary | ICD-10-CM | POA: Diagnosis not present

## 2018-12-05 DIAGNOSIS — R402364 Coma scale, best motor response, obeys commands, 24 hours or more after hospital admission: Secondary | ICD-10-CM | POA: Diagnosis not present

## 2018-12-05 DIAGNOSIS — J449 Chronic obstructive pulmonary disease, unspecified: Secondary | ICD-10-CM | POA: Diagnosis present

## 2018-12-05 DIAGNOSIS — Z79899 Other long term (current) drug therapy: Secondary | ICD-10-CM

## 2018-12-05 DIAGNOSIS — Z7982 Long term (current) use of aspirin: Secondary | ICD-10-CM

## 2018-12-05 DIAGNOSIS — R402144 Coma scale, eyes open, spontaneous, 24 hours or more after hospital admission: Secondary | ICD-10-CM | POA: Diagnosis not present

## 2018-12-05 DIAGNOSIS — I160 Hypertensive urgency: Secondary | ICD-10-CM | POA: Diagnosis not present

## 2018-12-05 DIAGNOSIS — J441 Chronic obstructive pulmonary disease with (acute) exacerbation: Secondary | ICD-10-CM | POA: Diagnosis not present

## 2018-12-05 DIAGNOSIS — I119 Hypertensive heart disease without heart failure: Secondary | ICD-10-CM | POA: Diagnosis not present

## 2018-12-05 DIAGNOSIS — K219 Gastro-esophageal reflux disease without esophagitis: Secondary | ICD-10-CM | POA: Diagnosis not present

## 2018-12-05 DIAGNOSIS — I25118 Atherosclerotic heart disease of native coronary artery with other forms of angina pectoris: Secondary | ICD-10-CM

## 2018-12-05 DIAGNOSIS — R410 Disorientation, unspecified: Secondary | ICD-10-CM | POA: Diagnosis not present

## 2018-12-05 DIAGNOSIS — I491 Atrial premature depolarization: Secondary | ICD-10-CM | POA: Diagnosis not present

## 2018-12-05 DIAGNOSIS — R0902 Hypoxemia: Secondary | ICD-10-CM | POA: Diagnosis not present

## 2018-12-05 DIAGNOSIS — Z609 Problem related to social environment, unspecified: Secondary | ICD-10-CM | POA: Diagnosis present

## 2018-12-05 DIAGNOSIS — Z888 Allergy status to other drugs, medicaments and biological substances status: Secondary | ICD-10-CM

## 2018-12-05 DIAGNOSIS — I499 Cardiac arrhythmia, unspecified: Secondary | ICD-10-CM | POA: Diagnosis not present

## 2018-12-05 DIAGNOSIS — Z86711 Personal history of pulmonary embolism: Secondary | ICD-10-CM

## 2018-12-05 DIAGNOSIS — Z91018 Allergy to other foods: Secondary | ICD-10-CM

## 2018-12-05 LAB — CBC WITH DIFFERENTIAL/PLATELET
Abs Immature Granulocytes: 0.06 10*3/uL (ref 0.00–0.07)
Basophils Absolute: 0.1 10*3/uL (ref 0.0–0.1)
Basophils Relative: 1 %
Eosinophils Absolute: 0.1 10*3/uL (ref 0.0–0.5)
Eosinophils Relative: 3 %
HCT: 34 % — ABNORMAL LOW (ref 36.0–46.0)
Hemoglobin: 10.4 g/dL — ABNORMAL LOW (ref 12.0–15.0)
Immature Granulocytes: 1 %
Lymphocytes Relative: 31 %
Lymphs Abs: 1.6 10*3/uL (ref 0.7–4.0)
MCH: 29.1 pg (ref 26.0–34.0)
MCHC: 30.6 g/dL (ref 30.0–36.0)
MCV: 95.2 fL (ref 80.0–100.0)
Monocytes Absolute: 0.6 10*3/uL (ref 0.1–1.0)
Monocytes Relative: 12 %
Neutro Abs: 2.8 10*3/uL (ref 1.7–7.7)
Neutrophils Relative %: 52 %
Platelets: 280 10*3/uL (ref 150–400)
RBC: 3.57 MIL/uL — ABNORMAL LOW (ref 3.87–5.11)
RDW: 21.6 % — ABNORMAL HIGH (ref 11.5–15.5)
WBC: 5.3 10*3/uL (ref 4.0–10.5)
nRBC: 0 % (ref 0.0–0.2)

## 2018-12-05 LAB — BASIC METABOLIC PANEL
Anion gap: 9 (ref 5–15)
BUN: 15 mg/dL (ref 8–23)
CO2: 29 mmol/L (ref 22–32)
Calcium: 8.3 mg/dL — ABNORMAL LOW (ref 8.9–10.3)
Chloride: 106 mmol/L (ref 98–111)
Creatinine, Ser: 1 mg/dL (ref 0.44–1.00)
GFR calc Af Amer: 56 mL/min — ABNORMAL LOW (ref >60)
GFR calc non Af Amer: 49 mL/min — ABNORMAL LOW (ref >60)
Glucose, Bld: 99 mg/dL (ref 70–99)
Potassium: 2.8 mmol/L — ABNORMAL LOW (ref 3.5–5.1)
Sodium: 144 mmol/L (ref 135–145)

## 2018-12-05 LAB — BRAIN NATRIURETIC PEPTIDE: B Natriuretic Peptide: 142.1 pg/mL — ABNORMAL HIGH (ref 0.0–100.0)

## 2018-12-05 LAB — TROPONIN I: Troponin I: <0.03 ng/mL (ref <0.03)

## 2018-12-05 MED ORDER — POTASSIUM CHLORIDE CRYS ER 20 MEQ PO TBCR
60.0000 meq | EXTENDED_RELEASE_TABLET | Freq: Once | ORAL | Status: AC
  Administered 2018-12-05: 60 meq via ORAL
  Filled 2018-12-05: qty 3

## 2018-12-05 MED ORDER — ZOLPIDEM TARTRATE 5 MG PO TABS
5.0000 mg | ORAL_TABLET | Freq: Every evening | ORAL | Status: DC | PRN
  Administered 2018-12-06: 5 mg via ORAL
  Filled 2018-12-05: qty 1

## 2018-12-05 MED ORDER — ONDANSETRON HCL 4 MG PO TABS: 4.0000 mg | ORAL_TABLET | Freq: Four times a day (QID) | ORAL | Status: DC | PRN

## 2018-12-05 MED ORDER — IPRATROPIUM-ALBUTEROL 0.5-2.5 (3) MG/3ML IN SOLN
3.0000 mL | Freq: Once | RESPIRATORY_TRACT | Status: AC
  Administered 2018-12-05: 3 mL via RESPIRATORY_TRACT
  Filled 2018-12-05: qty 3

## 2018-12-05 MED ORDER — DM-GUAIFENESIN ER 30-600 MG PO TB12: 1.0000 | ORAL_TABLET | Freq: Two times a day (BID) | ORAL | Status: DC | PRN

## 2018-12-05 MED ORDER — ACETAMINOPHEN 325 MG PO TABS
650.0000 mg | ORAL_TABLET | Freq: Four times a day (QID) | ORAL | Status: DC | PRN
  Administered 2018-12-06: 650 mg via ORAL
  Filled 2018-12-05: qty 2

## 2018-12-05 MED ORDER — HYDRALAZINE HCL 20 MG/ML IJ SOLN: 5.0000 mg | INTRAMUSCULAR | Status: DC | PRN

## 2018-12-05 MED ORDER — POTASSIUM CHLORIDE 10 MEQ/100ML IV SOLN
10.0000 meq | INTRAVENOUS | Status: AC
  Administered 2018-12-05 (×2): 10 meq via INTRAVENOUS
  Filled 2018-12-05 (×2): qty 100

## 2018-12-05 MED ORDER — IPRATROPIUM-ALBUTEROL 0.5-2.5 (3) MG/3ML IN SOLN: 3.0000 mL | Freq: Four times a day (QID) | RESPIRATORY_TRACT | Status: DC

## 2018-12-05 MED ORDER — ACETAMINOPHEN 650 MG RE SUPP: 650.0000 mg | Freq: Four times a day (QID) | RECTAL | Status: DC | PRN

## 2018-12-05 MED ORDER — METHYLPREDNISOLONE SODIUM SUCC 125 MG IJ SOLR
80.0000 mg | Freq: Once | INTRAMUSCULAR | Status: AC
  Administered 2018-12-05: 21:00:00 via INTRAVENOUS
  Filled 2018-12-05: qty 2

## 2018-12-05 MED ORDER — ALBUTEROL SULFATE (2.5 MG/3ML) 0.083% IN NEBU: 2.5000 mg | INHALATION_SOLUTION | RESPIRATORY_TRACT | Status: DC | PRN

## 2018-12-05 MED ORDER — ONDANSETRON HCL 4 MG/2ML IJ SOLN: 4.0000 mg | Freq: Four times a day (QID) | INTRAMUSCULAR | Status: DC | PRN

## 2018-12-05 MED ORDER — ENOXAPARIN SODIUM 40 MG/0.4ML ~~LOC~~ SOLN
40.0000 mg | SUBCUTANEOUS | Status: DC
  Administered 2018-12-06 – 2018-12-09 (×4): 40 mg via SUBCUTANEOUS
  Filled 2018-12-05 (×3): qty 0.4

## 2018-12-05 MED ORDER — MAGNESIUM SULFATE 2 GM/50ML IV SOLN
2.0000 g | Freq: Once | INTRAVENOUS | Status: AC
  Administered 2018-12-05: 2 g via INTRAVENOUS
  Filled 2018-12-05: qty 50

## 2018-12-05 MED ORDER — SENNOSIDES-DOCUSATE SODIUM 8.6-50 MG PO TABS: 1.0000 | ORAL_TABLET | Freq: Every evening | ORAL | Status: DC | PRN

## 2018-12-05 NOTE — ED Provider Notes (Signed)
[facility] Provider Note   CSN: [id number] Arrival date & time: 12/05/18  1624     History   Chief Complaint Chief Complaint  Patient presents with  . Hypertension    HPI Heidi Jefferson is a 84 y.o. female.  HPI  84 year old female presenting for evaluation.  Apparently patient's daughter called the fire department to help plug and oxygen tank.  They felt like she should come the emergency room for further evaluation.  They felt that her pupils they were were irregular and she was noted to be very hypertensive.  Patient herself really has no complaints but she seems mildly confused.  Reportedly she has been out of her metoprolol, Lasix and a statin since October.  [facility] for some prescriptions.  Patient states that she has been getting her medications but she is not specifically sure if she is getting all of them.  Apparently her daughter helps her with her medications.  She says her daughter is currently staying at her nephew's house is unsure of the contact information there.  She is unable to tell me who her PCP is or even what city she lives in.  She has [address] [facility] per her address.  Past Medical History:  Diagnosis Date  . COPD (chronic obstructive pulmonary disease) (HCC)   . Coronary artery disease   . Diabetes mellitus (HCC)   . GERD (gastroesophageal reflux disease)   . History of stroke   . Hyperlipidemia   . Hypertension     Patient Active Problem List   Diagnosis Date Noted  . Aortic stenosis 09/25/2018  . Pulmonary emboli (HCC) 12/14/2014  . HLD (hyperlipidemia) 12/14/2014  . GERD (gastroesophageal reflux disease) 12/14/2014  . CAD (coronary artery disease) 12/14/2014  . History of stroke 12/14/2014  . COPD exacerbation (HCC) 12/14/2014  . Acute pulmonary embolism (HCC) 12/14/2014  . PE (pulmonary embolism) 12/14/2014    Past Surgical History:    Procedure Laterality Date  . CHOLECYSTECTOMY       OB History   No obstetric history on file.      Home Medications    Prior to Admission medications   Medication Sig Start Date End Date Taking? Authorizing Provider  amLODipine (NORVASC) 10 MG tablet Take 10 mg by mouth daily.    [provider]  aspirin 81 MG tablet Take 81 mg by mouth daily.     [provider]  atorvastatin (LIPITOR) 20 MG tablet Take 20 mg by mouth daily.    [provider]  Calcium Carb-Cholecalciferol (CALTRATE 600+D) 600-800 MG-UNIT TABS Take 1 tablet by mouth daily.    [provider]  cholecalciferol (VITAMIN D) 1000 UNITS tablet Take 1,000 Units by mouth daily.    [provider]  fluticasone (FLONASE) 50 MCG/ACT nasal spray Place 1-2 sprays into both nostrils daily.    [provider]  furosemide (LASIX) 40 MG tablet Take 40 mg by mouth daily.    [provider]  metoprolol tartrate (LOPRESSOR) 25 MG tablet Take 25 mg by mouth 2 (two) times daily.     [provider]  montelukast (SINGULAIR) 10 MG tablet Take 10 mg by mouth at bedtime.    [provider]  ranitidine (ZANTAC) 300 MG tablet Take 300 mg by mouth at bedtime.    [provider]  valsartan (DIOVAN) 80 MG tablet Take 80 mg by mouth daily.  [provider]    Family History Family History  Problem Relation Age of Onset  . Hip fracture Mother     Social History Social History   Tobacco Use  . Smoking status: Never Smoker  . Smokeless tobacco: Never Used  Substance Use Topics  . Alcohol use: Not on file  . Drug use: Not on file     Allergies   Mustard [allyl isothiocyanate]; Zestril [lisinopril]; Zithromax [azithromycin]; Codeine; and Novocain [procaine]   Review of Systems Review of Systems  All systems reviewed and negative, other than as noted in HPI.  Physical Exam Updated Vital Signs BP (!) 180/82   Pulse 79   Temp  97.9 F (36.6 C) (Oral)   Resp 20   Ht 5\' 1"  (1.549 m)   Wt 74 kg   SpO2 100%   BMI 30.83 kg/m   Physical Exam Vitals signs and nursing note reviewed.  Constitutional:      General: She is not in acute distress.    Appearance: She is well-developed.  HENT:     Head: Normocephalic and atraumatic.  Eyes:     General:        Right eye: No discharge.        Left eye: No discharge.     Conjunctiva/sclera: Conjunctivae normal.  Neck:     Musculoskeletal: Neck supple.  Cardiovascular:     Rate and Rhythm: Normal rate and regular rhythm.     Heart sounds: Normal heart sounds. No murmur. No friction rub. No gallop.   Pulmonary:     Effort: Pulmonary effort is normal. No respiratory distress.     Breath sounds: Wheezing present.  Abdominal:     General: There is no distension.     Palpations: Abdomen is soft.     Tenderness: There is no abdominal tenderness.  Musculoskeletal:        General: No tenderness.  Skin:    General: Skin is warm and dry.  Neurological:     Mental Status: She is alert.  Psychiatric:        Behavior: Behavior normal.        Thought Content: Thought content normal.      ED Treatments / Results  Labs (all labs ordered are listed, but only abnormal results are displayed) Labs Reviewed  BRAIN NATRIURETIC PEPTIDE - Abnormal; Notable for the following components:      Result Value   B Natriuretic Peptide 142.1 (*)    All other components within normal limits  CBC WITH DIFFERENTIAL/PLATELET - Abnormal; Notable for the following components:   RBC 3.57 (*)    Hemoglobin 10.4 (*)    HCT 34.0 (*)    RDW 21.6 (*)    All other components within normal limits  BASIC METABOLIC PANEL - Abnormal; Notable for the following components:   Potassium 2.8 (*)    Calcium 8.3 (*)    GFR calc non Af Amer 49 (*)    GFR calc Af Amer 56 (*)    All other components within normal limits  TROPONIN I    EKG EKG Interpretation  Date/Time:  Sunday December 05 2018  16:33:11 EST Ventricular Rate:  89 PR Interval:    QRS Duration: 94 QT Interval:  395 QTC Calculation: 481 R Axis:   50 Text Interpretation:  afib with some sinus beats? Minimal ST depression, lateral leads Baseline wander in lead(s) V2 Confirmed by Raeford RazorKohut, Keon Benscoter [phone]) on 12/05/2018 5:06:53 PM   Radiology Dg Chest  2 View  Result Date: 12/05/2018 CLINICAL DATA:  Dyspnea. EXAM: CHEST - 2 VIEW COMPARISON:  Radiographs of November 01, 2018. FINDINGS: Stable cardiomegaly. No pneumothorax is noted. Right lung is clear. Mild left basilar atelectasis is noted with small pleural effusion. Bony thorax is unremarkable. IMPRESSION: Mild left basilar atelectasis with small left pleural effusion. Electronically Signed   By: Lupita Raider, M.D.   On: 12/05/2018 18:56    Procedures Procedures (including critical care time)  Medications Ordered in ED Medications  potassium chloride 10 mEq in 100 mL IVPB (10 mEq Intravenous New Bag/Given 12/05/18 2106)  potassium chloride SA (K-DUR,KLOR-CON) CR tablet 60 mEq (60 mEq Oral Given 12/05/18 1931)  magnesium sulfate IVPB 2 g 50 mL (0 g Intravenous Stopped 12/05/18 1944)  ipratropium-albuterol (DUONEB) 0.5-2.5 (3) MG/3ML nebulizer solution 3 mL (3 mLs Nebulization Given 12/05/18 2041)  methylPREDNISolone sodium succinate (SOLU-MEDROL) 125 mg/2 mL injection 80 mg ( Intravenous Given 12/05/18 2101)     Initial Impression / Assessment and Plan / ED Course  I have reviewed the triage vital signs and the nursing notes.  Pertinent labs & imaging results that were available during my care of the patient were reviewed by me and considered in my medical decision making (see chart for details).     84 year old female referred to the emergency room for further evaluation.  Having some difficulty sorting out what exactly the situation is.  Apparently she lives with her adult daughter rest and significant health issues herself.  Her daughter is apparently  currently staying with her nephew and patient is unsure of her contact information there.  She is very hypertensive but denies much in terms of acute complaints.  Some wheezing on exam but work of breathing associated with Heidi Jefferson.  This improved with a neb.  Also noted to be pretty hypokalemic.  Supplemented.  She does endorse some increasing edema when specifically asked.  Apparently she has been off her metoprolol Lasix and statin for several months.  We will admit her for hypokalemia.  Also for social concerns that she may not be getting her medications as needed and that her adult daughter may potentially not be able to physically be able to provide the care that patient needs.    Final Clinical Impressions(s) / ED Diagnoses   Final diagnoses:  COPD exacerbation (HCC)  Hypokalemia    ED Discharge Orders    None       Raeford Razor, MD 12/10/18 [id number]

## 2018-12-05 NOTE — H&P (Addendum)
History and Physical    [name] FAO:[id number] DOB: 03/08/1923 DOA: 12/05/2018  Referring MD/NP/PA:   PCP: Alinda DeemPenner, [name], MD   Patient coming from:  The patient is coming from home.  At baseline, pt is partially dependent for most of ADL.        Chief Complaint: Elevated blood pressure  HPI:  Heidi Jefferson is a 84 y.o. female with medical history significant of hypertension, hyperlipidemia, diabetes mellitus, COPD, stroke, CAD, GERD, PE (2016 not on oral anticoagulant), who presents with elevated blood pressure.  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 P's  bedridden daughter takes care of pt medications. Patient's daughter called the fire department to help plug and oxygen tank.  They felt like she should come the emergency room for further evaluation.  They felt that her pupils were irregular and she was noted to be very hypertensive. Her Bp is 202/90 initialy in ED. When I saw pt in ED, her pupils are PERRLA, reactive to light and regular. Pt states that she has minimal headache and mild chest pain earlier, which is resolved. She has mild dry cough and mild SOB. Denies nausea, vomiting, diarrhea, abdominal pain and symptoms of UTI.  No unilateral weakness numbness in extremities with no facial droop or slurred speech. She was reportedly to be mildly confused earlier, but currently she is orientated x3.  She answered all questions appropriately when I saw in ED. RN noted, "pt switched pharmacy 2 months ago. Pt and her daughter can not drive to new pharmacy to retrieve medications". Reportedly she has been out of her metoprolol, Lasix and statin since October.   ED Course: pt was found to have WBC 5.3, negative troponin, BNP 42.1, potassium 2.8, creatinine normal, temperature normal, heart rate in 90s, oxygen saturation 94% on room air.  Chest x-ray showed mild left basilar atelectasis and small left pleural effusion.  Patient is placed on telemetry bed for  observation.  Review of Systems:   General: no fevers, chills, no body weight gain, has fatigue and HA HEENT: no blurry vision, hearing changes or sore throat Respiratory: has dyspnea, coughing, wheezing CV: has chest pain, no palpitations GI: no nausea, vomiting, abdominal pain, diarrhea, constipation GU: no dysuria, burning on urination, increased urinary frequency, hematuria  Ext: has mild leg edema Neuro: no unilateral weakness, numbness, or tingling, no vision change or hearing loss Skin: no rash, no skin tear. MSK: No muscle spasm, no deformity, no limitation of range of movement in spin Heme: No easy bruising.  Travel history: No recent long distant travel.  Allergy:  Allergies  Allergen Reactions  . Arlice ColtMustard [Allyl Isothiocyanate] Hives  . Zestril [Lisinopril] Cough  . Zithromax [Azithromycin] Other (See Comments)    dizziness  . Codeine Nausea And Vomiting and Rash  . Novocain [Procaine] Rash    Past Medical History:  Diagnosis Date  . COPD (chronic obstructive pulmonary disease) (HCC)   . Coronary artery disease   . Diabetes mellitus (HCC)   . GERD (gastroesophageal reflux disease)   . History of stroke   . Hyperlipidemia   . Hypertension     Past Surgical History:  Procedure Laterality Date  . CHOLECYSTECTOMY      Social History:  reports that she has never smoked. She has never used smokeless tobacco. She reports that she does not drink alcohol or use drugs.  Family History:  Family History  Problem Relation Age of Onset  . Hip fracture  Mother      Prior to Admission medications   Medication Sig Start Date End Date Taking? Authorizing Provider  amLODipine (NORVASC) 10 MG tablet Take 10 mg by mouth daily.    [provider]  aspirin 81 MG tablet Take 81 mg by mouth daily.     [provider]  atorvastatin (LIPITOR) 20 MG tablet Take 20 mg by mouth daily.    [provider]  Calcium Carb-Cholecalciferol (CALTRATE 600+D)  600-800 MG-UNIT TABS Take 1 tablet by mouth daily.    [provider]  cholecalciferol (VITAMIN D) 1000 UNITS tablet Take 1,000 Units by mouth daily.    [provider]  fluticasone (FLONASE) 50 MCG/ACT nasal spray Place 1-2 sprays into both nostrils daily.    [provider]  furosemide (LASIX) 40 MG tablet Take 40 mg by mouth daily.    [provider]  metoprolol tartrate (LOPRESSOR) 25 MG tablet Take 25 mg by mouth 2 (two) times daily.     [provider]  montelukast (SINGULAIR) 10 MG tablet Take 10 mg by mouth at bedtime.    [provider]  ranitidine (ZANTAC) 300 MG tablet Take 300 mg by mouth at bedtime.    [provider]  valsartan (DIOVAN) 80 MG tablet Take 80 mg by mouth daily.     [provider]    Physical Exam: Vitals:   12/05/18 2030 12/05/18 2041 12/05/18 2045 12/05/18 2122  BP: (!) 199/74  (!) 180/82 (!) 168/80  Pulse: 81  79 90  Resp: 20  20 12   Temp:      TempSrc:      SpO2: 95% 95% 100% 94%  Weight:      Height:       General: Not in acute distress HEENT:       Eyes: PERRL, EOMI, no scleral icterus.       ENT: No discharge from the ears and nose, no pharynx injection, no tonsillar enlargement.        Neck: No JVD, no bruit, no mass felt. Heme: No neck lymph node enlargement. Cardiac: S1/S2, RRR, 3/6 systolic murmurs, No gallops or rubs. Respiratory:  No rales, wheezing, rhonchi or rubs. GI: Soft, nondistended, nontender, no rebound pain, no organomegaly, BS present. GU: No hematuria Ext: has trace leg edema bilaterally. 2+DP/PT pulse bilaterally. Musculoskeletal: No joint deformities, No joint redness or warmth, no limitation of ROM in spin. Skin: No rashes.  Neuro: Alert, oriented X3, cranial nerves II-XII grossly intact, moves all extremities normally.  Psych: Patient is not psychotic, no suicidal or hemocidal ideation.  Labs on Admission: I have personally reviewed following labs and  imaging studies  CBC: Recent Labs  Lab 12/05/18 1725  WBC 5.3  NEUTROABS 2.8  HGB 10.4*  HCT 34.0*  MCV 95.2  PLT 280   Basic Metabolic Panel: Recent Labs  Lab 12/05/18 1725  NA 144  K 2.8*  CL 106  CO2 29  GLUCOSE 99  BUN 15  CREATININE 1.00  CALCIUM 8.3*   GFR: Estimated Creatinine Clearance: 32.3 mL/min (by C-G formula based on SCr of 1 mg/dL). Liver Function Tests: No results for input(s): AST, ALT, ALKPHOS, BILITOT, PROT, ALBUMIN in the last 168 hours. No results for input(s): LIPASE, AMYLASE in the last 168 hours. No results for input(s): AMMONIA in the last 168 hours. Coagulation Profile: No results for input(s): INR, PROTIME in the last 168 hours. Cardiac Enzymes: Recent Labs  Lab 12/05/18 1725  TROPONINI <0.03  BNP (last 3 results) No results for input(s): PROBNP in the last 8760 hours. HbA1C: No results for input(s): HGBA1C in the last 72 hours. CBG: No results for input(s): GLUCAP in the last 168 hours. Lipid Profile: No results for input(s): CHOL, HDL, LDLCALC, TRIG, CHOLHDL, LDLDIRECT in the last 72 hours. Thyroid Function Tests: No results for input(s): TSH, T4TOTAL, FREET4, T3FREE, THYROIDAB in the last 72 hours. Anemia Panel: No results for input(s): VITAMINB12, FOLATE, FERRITIN, TIBC, IRON, RETICCTPCT in the last 72 hours. Urine analysis: No results found for: COLORURINE, APPEARANCEUR, LABSPEC, PHURINE, GLUCOSEU, HGBUR, BILIRUBINUR, KETONESUR, PROTEINUR, UROBILINOGEN, NITRITE, LEUKOCYTESUR Sepsis Labs: @LABRCNTIP (procalcitonin:4,lacticidven:4) )No results found for this or any previous visit (from the past 240 hour(s)).   Radiological Exams on Admission: Dg Chest 2 View  Result Date: 12/05/2018 CLINICAL DATA:  Dyspnea. EXAM: CHEST - 2 VIEW COMPARISON:  Radiographs of November 01, 2018. FINDINGS: Stable cardiomegaly. No pneumothorax is noted. Right lung is clear. Mild left basilar atelectasis is noted with small pleural effusion. Bony  thorax is unremarkable. IMPRESSION: Mild left basilar atelectasis with small left pleural effusion. Electronically Signed   By: Lupita RaiderJames  Green Jr, M.D.   On: 12/05/2018 18:56     EKG: Independently reviewed.  Sinus rhythm, QTC 481, PVC, low voltage, nonspecific T wave change.  Assessment/Plan Principal Problem:   Hypertensive urgency Active Problems:   HLD (hyperlipidemia)   GERD (gastroesophageal reflux disease)   CAD (coronary artery disease)   History of stroke   COPD (chronic obstructive pulmonary disease) (HCC)   Hypokalemia   Hypertensive urgency: Bp 202/90-->168/80-->138/93. This is due lack of ability to get her medications. She had minimal headache and a mild chest pain which has resolved.  No focal neurologic findings on physical examination. Pt is taking lasix 40 mg daily, but not care diagnosis of CHF.  Her 2D echo on 08/16/2018 showed EF of 65%.  She has mild leg edema, but no JVD or pulmonary edema on chest x-ray.  Does not seem to have acute CHF.  -will place on tele bed for obs -Bp: pt has Lasix 40 mg daily, amlodipine 10 mg daily, metoprolol 25 mg twice daily, losartan on her medication list.  Her blood pressure has improved to 138/93 without any treatment.  I will restart her amlodipine at 5 mg daily, continue metoprolol 25 mg bid, Lasix 40 mg daily and irbesartan 37.5 mg daily -prn hydralazine -Consult to social work and Sports coachcase manager  HLD (hyperlipidemia): -lipitor  GERD: -Pepcid  CAD (coronary artery disease): she had mild CP earlier, which has resolved.  Most likely due to demand ischemia secondary to elevated blood pressure. -Continue aspirin, Lipitor, metoprolol - Troponin x3  History of stroke: -Aspirin, Lipitor  COPD (chronic obstructive pulmonary disease) [facility](HCC): Per ED physician, patient had mild wheezing initially.  Patient was treated with bronchodilators and 2 g of magnesium sulfate in ED.  Currently patient does not have wheezing or rhonchi on  auscultation.  Does not seem to have severe COPD exacerbation. - DuoNeb nebulizers, PRN albuterol nebulizers -PRN Mucinex for cough  Hypokalemia: K=2.8 on admission. - Repleted - Check Mg level - 2 g of magnesium sulfate was given in ED which is for wheezing.  Concerning social issues:  pt is 84 y/o and takes care of her bedridden daughter. Pt bedridden daughter takes care of pt medications.  Pt and her daughter can not drive to pharmacy to retrieve medications. Her bedridden daughter may potentially not be able to physically provide the care that  patient needs.   -will consult CM and SW for possible placement    DVT ppx: SQ Lovenox Code Status: Full code Family Communication: None at bed side.    Disposition Plan: to be determined Consults called:  none Admission status: Obs / tele    Date of Service 12/05/2018    Lorretta Harp [facility] Pager [phone]  If 7PM-7AM, please contact night-coverage [email or web address] Password [facility] 12/05/2018, 11:36 PM

## 2018-12-05 NOTE — ED Triage Notes (Signed)
Per EMS pt is 84 y/o and takes care of her bedridden daughter. Pt bedridden daughter takes care of pt medications. Pt switched pharmacy 2 months ago. Pt nor daughter can drive to new pharmacy to retrieve medications. Pt daughter called FD to plug in oxygen tank. FD stated pt pupil looks blown. EMS states pt has cataracts. Pt is hypertensive and has been out of Metoprolol, Lasix, and atorvastatin since early October. Pt BP was 188/94 with EMS. Pt has no complaints other than mild headache which she wont rate because its mild.

## 2018-12-06 ENCOUNTER — Encounter (HOSPITAL_COMMUNITY): Payer: Self-pay

## 2018-12-06 DIAGNOSIS — I160 Hypertensive urgency: Secondary | ICD-10-CM | POA: Diagnosis not present

## 2018-12-06 LAB — MAGNESIUM: Magnesium: 2.1 mg/dL (ref 1.7–2.4)

## 2018-12-06 LAB — TROPONIN I
Troponin I: 0.03 ng/mL (ref <0.03)
Troponin I: <0.03 ng/mL (ref <0.03)
Troponin I: <0.03 ng/mL (ref <0.03)

## 2018-12-06 MED ORDER — VITAMIN D 25 MCG (1000 UNIT) PO TABS
1000.0000 [IU] | ORAL_TABLET | Freq: Every day | ORAL | Status: DC
  Administered 2018-12-06 – 2018-12-10 (×5): 1000 [IU] via ORAL
  Filled 2018-12-06 (×5): qty 1

## 2018-12-06 MED ORDER — MONTELUKAST SODIUM 10 MG PO TABS
10.0000 mg | ORAL_TABLET | Freq: Every day | ORAL | Status: DC
  Administered 2018-12-06 – 2018-12-09 (×4): 10 mg via ORAL
  Filled 2018-12-06 (×5): qty 1

## 2018-12-06 MED ORDER — METOPROLOL TARTRATE 25 MG PO TABS
25.0000 mg | ORAL_TABLET | Freq: Two times a day (BID) | ORAL | Status: DC
  Administered 2018-12-06 – 2018-12-10 (×10): 25 mg via ORAL
  Filled 2018-12-06 (×10): qty 1

## 2018-12-06 MED ORDER — FLUTICASONE PROPIONATE 50 MCG/ACT NA SUSP
1.0000 | Freq: Every day | NASAL | Status: DC
  Administered 2018-12-06: 2 via NASAL
  Administered 2018-12-07 – 2018-12-09 (×3): 1 via NASAL
  Administered 2018-12-10: 2 via NASAL
  Filled 2018-12-06: qty 16

## 2018-12-06 MED ORDER — ASPIRIN 81 MG PO CHEW
81.0000 mg | CHEWABLE_TABLET | Freq: Every day | ORAL | Status: DC
  Administered 2018-12-06 – 2018-12-10 (×5): 81 mg via ORAL
  Filled 2018-12-06 (×5): qty 1

## 2018-12-06 MED ORDER — IRBESARTAN 75 MG PO TABS
37.5000 mg | ORAL_TABLET | Freq: Every day | ORAL | Status: DC
  Administered 2018-12-07 – 2018-12-10 (×4): 37.5 mg via ORAL
  Filled 2018-12-06 (×6): qty 0.5

## 2018-12-06 MED ORDER — FUROSEMIDE 40 MG PO TABS
40.0000 mg | ORAL_TABLET | Freq: Every day | ORAL | Status: DC
  Administered 2018-12-06 – 2018-12-10 (×5): 40 mg via ORAL
  Filled 2018-12-06 (×5): qty 1

## 2018-12-06 MED ORDER — ATORVASTATIN CALCIUM 10 MG PO TABS
20.0000 mg | ORAL_TABLET | Freq: Every day | ORAL | Status: DC
  Administered 2018-12-06 – 2018-12-10 (×5): 20 mg via ORAL
  Filled 2018-12-06 (×5): qty 2

## 2018-12-06 MED ORDER — CALCIUM CARBONATE-VITAMIN D 500-200 MG-UNIT PO TABS
1.0000 | ORAL_TABLET | Freq: Every day | ORAL | Status: DC
  Administered 2018-12-06 – 2018-12-10 (×5): 1 via ORAL
  Filled 2018-12-06 (×5): qty 1

## 2018-12-06 MED ORDER — AMLODIPINE BESYLATE 5 MG PO TABS
5.0000 mg | ORAL_TABLET | Freq: Every day | ORAL | Status: DC
  Administered 2018-12-06 – 2018-12-09 (×5): 5 mg via ORAL
  Filled 2018-12-06 (×5): qty 1

## 2018-12-06 MED ORDER — FAMOTIDINE 20 MG PO TABS
20.0000 mg | ORAL_TABLET | Freq: Every day | ORAL | Status: DC
  Administered 2018-12-06 – 2018-12-09 (×5): 20 mg via ORAL
  Filled 2018-12-06 (×5): qty 1

## 2018-12-06 NOTE — Progress Notes (Signed)
PROGRESS NOTE    [place] BingLillie M Mroczka  ZOX:[id number] DOB: 11/23/1923 DOA: 12/05/2018 PCP: Alinda DeemPenner, [name], MD   Brief Narrative:  [place] BingLillie M Hilyard is a 84 y.o. female with medical history significant of hypertension, hyperlipidemia, diabetes mellitus, COPD, stroke, CAD, GERD, PE (2016 not on oral anticoagulant), who presents with elevated blood pressure.  Weeks ago the patient was seen by [place]. [place] and her blood pressures were normal and her mentation was completely normal.  Today the patient does not know where she is she thinks she is in a big house, she thinks the year is 2021, she thinks the month is January, but she does know the president is Trump.  Patient states that she is unable to give me any information about her pharmacy or medication refills.  She apparently has no idea what is been going on with that.  This is also a significant change from having been evaluated by Dr. Tomie Chinaevankar just 3 weeks ago.  Given her markedly elevated blood pressures and normal blood pressures at the office visit I am concerned about a stroke causing change in her mentation.  Going to order an MRI of the brain.  This point she is too far out to receive any TPA.   Assessment & Plan:   Principal Problem:   Hypertensive urgency Active Problems:   HLD (hyperlipidemia)   GERD (gastroesophageal reflux disease)   CAD (coronary artery disease)   History of stroke   COPD (chronic obstructive pulmonary disease) (HCC)   Hypokalemia   Hypertensive urgency possibly caused by subacute stroke: Bp 202/90-->168/80-->138/93. This is due lack of ability to get her medications initially thought in the emergency department but 3 weeks ago the patient was receiving her medications and completely normal when evaluated by Dr. Tomie Chinaevankar.  Blood pressure was stable. She had minimal headache and a mild chest pain which has resolved.  No focal neurologic findings on physical examination. Pt is taking lasix 40 mg daily, but  not care diagnosis of CHF.  Her 2D echo on 08/16/2018 showed EF of 65%.  She has mild leg edema, but no JVD or pulmonary edema on chest x-ray.  Does not seem to have acute CHF.  -tele bed for obs -Bp: pt has Lasix 40 mg daily, amlodipine 10 mg daily, metoprolol 25 mg twice daily, losartan on her medication list.  Her blood pressure has improved to 138/93 without any treatment.  I will restart her amlodipine at 5 mg daily, continue metoprolol 25 mg bid, Lasix 40 mg daily and irbesartan 37.5 mg daily -prn hydralazine -Consult to social work and case manager Check MRI of the brain if abnormal consider neurology consultation  HLD (hyperlipidemia): -lipitor  GERD: -Pepcid  CAD (coronary artery disease): she had mild CP earlier, which has resolved.  Most likely due to demand ischemia secondary to elevated blood pressure. -Continue aspirin, Lipitor, metoprolol - Troponin x3 negative patient has ruled out.  History of stroke: -Aspirin, Lipitor  COPD (chronic obstructive pulmonary disease) [facility](HCC): Per ED physician, patient had mild wheezing initially.  Patient was treated with bronchodilators and 2 g of magnesium sulfate in ED.  Currently patient does not have wheezing or rhonchi on auscultation.  Does not seem to have severe COPD exacerbation. - DuoNeb nebulizers, PRN albuterol nebulizers -PRN Mucinex for cough  Hypokalemia: K=2.8 on admission. - Repleted - Check Mg level - 2 g of magnesium sulfate was given in ED which is for wheezing.  Concerning social issues:  pt is 84  y/o and takes care of her bedridden daughter. Pt bedridden daughter takes care of pt medications.  Pt and her daughter can not drive to pharmacy to retrieve medications. Her bedridden daughter may potentially not be able to physically provide the care that patient needs.  -will consult CM and SW for possible placement    DVT ppx: SQ Lovenox Code Status: Full code Family Communication: None at bed side.      Disposition Plan: to be determined Consults called:  none Admission status: Obs / tele  Subjective: 85 year old female with a significant change in her mentation and ability to take her medicines since 3 weeks ago.  Evaluation is in process.  MRI has been ordered of the brain.  Objective: Vitals:   12/06/18 0227 12/06/18 0642 12/06/18 0801 12/06/18 1424  BP: 131/67 137/79 (!) 181/79 (!) 169/65  Pulse: 73 80 88 78  Resp: 20 18 18 19   Temp:   98.6 F (37 C) 98.2 F (36.8 C)  TempSrc:   Oral Oral  SpO2: 98% 94% 94% 95%  Weight:      Height:        Intake/Output Summary (Last 24 hours) at 12/06/2018 1442 Last data filed at 12/05/2018 2156 Gross per 24 hour  Intake 150 ml  Output -  Net 150 ml   Filed Weights   12/05/18 1721  Weight: 74 kg    Examination:  General exam: Appears calm and comfortable  Respiratory system: Clear to auscultation. Respiratory effort normal. Cardiovascular system: S1 & S2 heard, RRR. No JVD, murmurs, rubs, gallops or clicks. No pedal edema. Gastrointestinal system: Abdomen is nondistended, soft and nontender. No organomegaly or masses felt. Normal bowel sounds heard. Central nervous system: Alert and disoriented. No focal neurological deficits. Extremities: Symmetric 5 x 5 power. Skin: No rashes, lesions or ulcers Psychiatry: Judgement and insight appear poor. Mood & affect appropriate.     Data Reviewed: I have personally reviewed following labs and imaging studies  CBC: Recent Labs  Lab 12/05/18 1725  WBC 5.3  NEUTROABS 2.8  HGB 10.4*  HCT 34.0*  MCV 95.2  PLT 280   Basic Metabolic Panel: Recent Labs  Lab 12/05/18 1725 12/06/18 0545  NA 144  --   K 2.8*  --   CL 106  --   CO2 29  --   GLUCOSE 99  --   BUN 15  --   CREATININE 1.00  --   CALCIUM 8.3*  --   MG  --  2.1   GFR: Estimated Creatinine Clearance: 32.3 mL/min (by C-G formula based on SCr of 1 mg/dL). Liver Function Tests: No results for input(s): AST, ALT,  ALKPHOS, BILITOT, PROT, ALBUMIN in the last 168 hours. No results for input(s): LIPASE, AMYLASE in the last 168 hours. No results for input(s): AMMONIA in the last 168 hours. Coagulation Profile: No results for input(s): INR, PROTIME in the last 168 hours. Cardiac Enzymes: Recent Labs  Lab 12/05/18 1725 12/05/18 2338 12/06/18 0545 12/06/18 1116  TROPONINI <0.03 0.03* <0.03 <0.03   BNP (last 3 results) No results for input(s): PROBNP in the last 8760 hours. HbA1C: No results for input(s): HGBA1C in the last 72 hours. CBG: No results for input(s): GLUCAP in the last 168 hours. Lipid Profile: No results for input(s): CHOL, HDL, LDLCALC, TRIG, CHOLHDL, LDLDIRECT in the last 72 hours. Thyroid Function Tests: No results for input(s): TSH, T4TOTAL, FREET4, T3FREE, THYROIDAB in the last 72 hours. Anemia Panel: No results for input(s): VITAMINB12,  FOLATE, FERRITIN, TIBC, IRON, RETICCTPCT in the last 72 hours. Sepsis Labs: No results for input(s): PROCALCITON, LATICACIDVEN in the last 168 hours.  No results found for this or any previous visit (from the past 240 hour(s)).       Radiology Studies: Dg Chest 2 View  Result Date: 12/05/2018 CLINICAL DATA:  Dyspnea. EXAM: CHEST - 2 VIEW COMPARISON:  Radiographs of November 01, 2018. FINDINGS: Stable cardiomegaly. No pneumothorax is noted. Right lung is clear. Mild left basilar atelectasis is noted with small pleural effusion. Bony thorax is unremarkable. IMPRESSION: Mild left basilar atelectasis with small left pleural effusion. Electronically Signed   By: Lupita RaiderJames  Green Jr, M.D.   On: 12/05/2018 18:56        Scheduled Meds: . amLODipine  5 mg Oral Daily  . aspirin  81 mg Oral Daily  . atorvastatin  20 mg Oral Daily  . calcium-vitamin D  1 tablet Oral Daily  . cholecalciferol  1,000 Units Oral Daily  . enoxaparin (LOVENOX) injection  40 mg Subcutaneous Q24H  . famotidine  20 mg Oral QHS  . fluticasone  1-2 spray Each Nare Daily    . furosemide  40 mg Oral Daily  . irbesartan  37.5 mg Oral Daily  . metoprolol tartrate  25 mg Oral BID  . montelukast  10 mg Oral QHS   Continuous Infusions:   LOS: 0 days    Time spent: 35 minutes   Lahoma Crockerheresa C Sheehan, MD FACP [facility] Pager [phone]  If 7PM-7AM, please contact night-coverage [email or web address] Password [email or web address] 12/06/2018, 2:42 PM

## 2018-12-06 NOTE — ED Notes (Signed)
Walked patient to bathroom , steady gait with assistance.

## 2018-12-06 NOTE — ED Notes (Signed)
Ordered breakfast diet heart healthy  

## 2018-12-06 NOTE — Care Management Obs Status (Signed)
MEDICARE OBSERVATION STATUS NOTIFICATION   Patient Details  Name: Heidi Jefferson MRN: [id number] Date of Birth: 08/17/1923   Medicare Observation Status Notification Given:  Yes   Patient has dementia, with her daughter was [facility] with Sepsis.    Lollie MarrowNatalie Y Burhanuddin Kohlmann, RN 12/06/2018, 3:31 PM

## 2018-12-06 NOTE — Evaluation (Signed)
Physical Therapy Evaluation Patient Details Name: Heidi Jefferson Clausen MRN: [id number] DOB: 03/25/1923 Today's Date: 12/06/2018   History of Present Illness  Pt is a 84 y/o female with a PMH significant for HTN, DM, COPD, CVA, CAD, PE who presents with elevated BP.   Clinical Impression  Pt admitted with above diagnosis. Pt currently with functional limitations due to the deficits listed below (see PT Problem List). At the time of PT eval pt was able to perform transfers and ambulation with gross min guard assist for balance support and safety with RW use. Pt oriented x1 only and even after being reoriented by therapist, pt with poor recall. Pt reporting to this therapist that she helps to care for her daughter who is limited in mobility but not bedridden. They have an aide coming in 5 days/week and rely on a neighbor to take them to the grocery store. It appears that they have been having difficulty getting medications as neither the patient or daughter drives/has a car. If pt had reliable 24 hour assistance available at home, feel return home with home health services could be appropriate, however unsure if pt/family will be able to arrange this per pt. PT recommending continued rehab at the SNF level at this time. Acutely, pt will benefit from skilled PT to increase their independence and safety with mobility to allow discharge to the venue listed below.       Follow Up Recommendations SNF;Supervision/Assistance - 24 hour    Equipment Recommendations  None recommended by PT    Recommendations for Other Services       Precautions / Restrictions Precautions Precautions: Fall Precaution Comments: "Low fall risk" however pt demonstrated a higher risk for falls during session.  Restrictions Weight Bearing Restrictions: No      Mobility  Bed Mobility Overal bed mobility: Needs Assistance Bed Mobility: Supine to Sit;Sit to Supine     Supine to sit: Min guard Sit to supine: Min guard    General bed mobility comments: Close guard for safety as pt transitions to/from EOB. HOB elevated and pt using railings for support. Increased time required  Transfers Overall transfer level: Needs assistance Equipment used: Rolling walker (2 wheeled) Transfers: Sit to/from Stand Sit to Stand: Min guard         General transfer comment: Hands-on guarding required for power-up to full stand. Pt was cued for proper hand placement on seated surface for safety.   Ambulation/Gait Ambulation/Gait assistance: Min guard Gait Distance (Feet): 150 Feet Assistive device: Rolling walker (2 wheeled) Gait Pattern/deviations: Step-through pattern;Decreased stride length;Trunk flexed;Drifts right/left Gait velocity: Decreased Gait velocity interpretation: <1.31 ft/sec, indicative of household ambulator General Gait Details: Slow but generally steady. Noted pt drifting from R to L in hallway. Required frequent cues to keep attention to task.   Stairs            Wheelchair Mobility    Modified Rankin (Stroke Patients Only)       Balance Overall balance assessment: Needs assistance Sitting-balance support: Feet supported;No upper extremity supported Sitting balance-Leahy Scale: Fair     Standing balance support: Bilateral upper extremity supported;During functional activity Standing balance-Leahy Scale: Poor Standing balance comment: For dynamic activity pt requires UE support.                              Pertinent Vitals/Pain Pain Assessment: No/denies pain    Home Living Family/patient expects to be discharged to:: Private residence  Living Arrangements: Children Available Help at Discharge: Family;Available 24 hours/day;Personal care attendant Type of Home: House Home Access: Stairs to enter Entrance Stairs-Rails: Doctor, general practiceight;Left Entrance Stairs-Number of Steps: 5 Home Layout: One level Home Equipment: Walker - 2 wheels;Shower seat Additional Comments: Pt is  daughter's primary caregiver. States they do not have a car so they go to the grocery store with a neighbor sometimes.     Prior Function Level of Independence: Independent with assistive device(s)         Comments: Pt also has an aide coming in 5 days/week. Pt reports that the aide is only supposed to stay ~1 hour/day but she stays longer to help them out. Aide mainly does housework and occasionally cooks.      Hand Dominance        Extremity/Trunk Assessment   Upper Extremity Assessment Upper Extremity Assessment: Overall WFL for tasks assessed    Lower Extremity Assessment Lower Extremity Assessment: Overall WFL for tasks assessed    Cervical / Trunk Assessment Cervical / Trunk Assessment: Kyphotic  Communication   Communication: No difficulties  Cognition Arousal/Alertness: Awake/alert Behavior During Therapy: WFL for tasks assessed/performed Overall Cognitive Status: Impaired/Different from baseline Area of Impairment: Orientation;Attention;Memory;Following commands;Safety/judgement;Awareness;Problem solving                 Orientation Level: Disoriented to;Place;Time;Situation Current Attention Level: Selective Memory: Decreased short-term memory Following Commands: Follows one step commands consistently;Follows one step commands with increased time;Follows multi-step commands inconsistently Safety/Judgement: Decreased awareness of safety Awareness: Intellectual Problem Solving: Slow processing;Requires verbal cues        General Comments      Exercises     Assessment/Plan    PT Assessment Patient needs continued PT services  PT Problem List Decreased strength;Decreased activity tolerance;Decreased balance;Decreased mobility;Decreased cognition;Decreased knowledge of use of DME;Decreased safety awareness;Decreased knowledge of precautions       PT Treatment Interventions DME instruction;Gait training;Stair training;Functional mobility  training;Therapeutic activities;Therapeutic exercise;Neuromuscular re-education;Patient/family education    PT Goals (Current goals can be found in the Care Plan section)  Acute Rehab PT Goals Patient Stated Goal: Pt did not state goals throughout session.  PT Goal Formulation: Patient unable to participate in goal setting Time For Goal Achievement: 12/20/18 Potential to Achieve Goals: Good    Frequency Min 2X/week   Barriers to discharge Decreased caregiver support Pt is primary caregiver to her daughter who has limited mobility    Co-evaluation               AM-PAC PT "6 Clicks" Mobility  Outcome Measure Help needed turning from your back to your side while in a flat bed without using bedrails?: None Help needed moving from lying on your back to sitting on the side of a flat bed without using bedrails?: A Little Help needed moving to and from a bed to a chair (including a wheelchair)?: A Little Help needed standing up from a chair using your arms (e.g., wheelchair or bedside chair)?: A Little Help needed to walk in hospital room?: A Little Help needed climbing 3-5 steps with a railing? : A Lot 6 Click Score: 18    End of Session Equipment Utilized During Treatment: Gait belt Activity Tolerance: Patient tolerated treatment well Patient left: in bed;with call bell/phone within reach Nurse Communication: Mobility status PT Visit Diagnosis: Muscle weakness (generalized) (M62.81);Difficulty in walking, not elsewhere classified (R26.2)    Time: [id number] PT Time Calculation (min) (ACUTE ONLY): 19 min   Charges:   PT Evaluation $PT  Eval Moderate Complexity: 1 Mod          Conni Slipper, PT, DPT Acute Rehabilitation Services Pager: [phone] Office: [phone]   Marylynn Pearson 12/06/2018, 12:31 PM

## 2018-12-07 ENCOUNTER — Observation Stay (HOSPITAL_COMMUNITY): Payer: Medicare Other

## 2018-12-07 DIAGNOSIS — I4891 Unspecified atrial fibrillation: Secondary | ICD-10-CM | POA: Diagnosis present

## 2018-12-07 DIAGNOSIS — J90 Pleural effusion, not elsewhere classified: Secondary | ICD-10-CM | POA: Diagnosis not present

## 2018-12-07 DIAGNOSIS — E119 Type 2 diabetes mellitus without complications: Secondary | ICD-10-CM | POA: Diagnosis present

## 2018-12-07 DIAGNOSIS — R51 Headache: Secondary | ICD-10-CM | POA: Diagnosis not present

## 2018-12-07 DIAGNOSIS — F419 Anxiety disorder, unspecified: Secondary | ICD-10-CM | POA: Diagnosis not present

## 2018-12-07 DIAGNOSIS — M2550 Pain in unspecified joint: Secondary | ICD-10-CM | POA: Diagnosis not present

## 2018-12-07 DIAGNOSIS — Z8673 Personal history of transient ischemic attack (TIA), and cerebral infarction without residual deficits: Secondary | ICD-10-CM | POA: Diagnosis not present

## 2018-12-07 DIAGNOSIS — I10 Essential (primary) hypertension: Secondary | ICD-10-CM | POA: Diagnosis present

## 2018-12-07 DIAGNOSIS — J9811 Atelectasis: Secondary | ICD-10-CM | POA: Diagnosis present

## 2018-12-07 DIAGNOSIS — R402254 Coma scale, best verbal response, oriented, 24 hours or more after hospital admission: Secondary | ICD-10-CM | POA: Diagnosis not present

## 2018-12-07 DIAGNOSIS — J449 Chronic obstructive pulmonary disease, unspecified: Secondary | ICD-10-CM | POA: Diagnosis not present

## 2018-12-07 DIAGNOSIS — G9340 Encephalopathy, unspecified: Secondary | ICD-10-CM | POA: Diagnosis not present

## 2018-12-07 DIAGNOSIS — I160 Hypertensive urgency: Secondary | ICD-10-CM | POA: Diagnosis present

## 2018-12-07 DIAGNOSIS — Z9114 Patient's other noncompliance with medication regimen: Secondary | ICD-10-CM | POA: Diagnosis not present

## 2018-12-07 DIAGNOSIS — J441 Chronic obstructive pulmonary disease with (acute) exacerbation: Secondary | ICD-10-CM | POA: Diagnosis present

## 2018-12-07 DIAGNOSIS — I248 Other forms of acute ischemic heart disease: Secondary | ICD-10-CM | POA: Diagnosis present

## 2018-12-07 DIAGNOSIS — R402364 Coma scale, best motor response, obeys commands, 24 hours or more after hospital admission: Secondary | ICD-10-CM | POA: Diagnosis not present

## 2018-12-07 DIAGNOSIS — Z888 Allergy status to other drugs, medicaments and biological substances status: Secondary | ICD-10-CM | POA: Diagnosis not present

## 2018-12-07 DIAGNOSIS — Y92230 Patient room in hospital as the place of occurrence of the external cause: Secondary | ICD-10-CM | POA: Diagnosis not present

## 2018-12-07 DIAGNOSIS — Z7401 Bed confinement status: Secondary | ICD-10-CM | POA: Diagnosis not present

## 2018-12-07 DIAGNOSIS — T502X5A Adverse effect of carbonic-anhydrase inhibitors, benzothiadiazides and other diuretics, initial encounter: Secondary | ICD-10-CM | POA: Diagnosis not present

## 2018-12-07 DIAGNOSIS — I2510 Atherosclerotic heart disease of native coronary artery without angina pectoris: Secondary | ICD-10-CM | POA: Diagnosis present

## 2018-12-07 DIAGNOSIS — K219 Gastro-esophageal reflux disease without esophagitis: Secondary | ICD-10-CM | POA: Diagnosis present

## 2018-12-07 DIAGNOSIS — E876 Hypokalemia: Secondary | ICD-10-CM | POA: Diagnosis present

## 2018-12-07 DIAGNOSIS — Z7982 Long term (current) use of aspirin: Secondary | ICD-10-CM | POA: Diagnosis not present

## 2018-12-07 DIAGNOSIS — E785 Hyperlipidemia, unspecified: Secondary | ICD-10-CM | POA: Diagnosis present

## 2018-12-07 DIAGNOSIS — F0280 Dementia in other diseases classified elsewhere without behavioral disturbance: Secondary | ICD-10-CM | POA: Diagnosis not present

## 2018-12-07 DIAGNOSIS — Z79899 Other long term (current) drug therapy: Secondary | ICD-10-CM | POA: Diagnosis not present

## 2018-12-07 DIAGNOSIS — I674 Hypertensive encephalopathy: Secondary | ICD-10-CM | POA: Diagnosis present

## 2018-12-07 DIAGNOSIS — Z609 Problem related to social environment, unspecified: Secondary | ICD-10-CM | POA: Diagnosis present

## 2018-12-07 DIAGNOSIS — I119 Hypertensive heart disease without heart failure: Secondary | ICD-10-CM | POA: Diagnosis not present

## 2018-12-07 DIAGNOSIS — F329 Major depressive disorder, single episode, unspecified: Secondary | ICD-10-CM | POA: Diagnosis not present

## 2018-12-07 DIAGNOSIS — Z86711 Personal history of pulmonary embolism: Secondary | ICD-10-CM | POA: Diagnosis not present

## 2018-12-07 DIAGNOSIS — R402144 Coma scale, eyes open, spontaneous, 24 hours or more after hospital admission: Secondary | ICD-10-CM | POA: Diagnosis not present

## 2018-12-07 LAB — BASIC METABOLIC PANEL
Anion gap: 9 (ref 5–15)
BUN: 17 mg/dL (ref 8–23)
CO2: 34 mmol/L — ABNORMAL HIGH (ref 22–32)
Calcium: 8.4 mg/dL — ABNORMAL LOW (ref 8.9–10.3)
Chloride: 100 mmol/L (ref 98–111)
Creatinine, Ser: 0.86 mg/dL (ref 0.44–1.00)
GFR calc Af Amer: >60 mL/min (ref >60)
GFR calc non Af Amer: 58 mL/min — ABNORMAL LOW (ref >60)
Glucose, Bld: 109 mg/dL — ABNORMAL HIGH (ref 70–99)
Potassium: 2.9 mmol/L — ABNORMAL LOW (ref 3.5–5.1)
Sodium: 143 mmol/L (ref 135–145)

## 2018-12-07 LAB — MAGNESIUM: MAGNESIUM: 2 mg/dL (ref 1.7–2.4)

## 2018-12-07 MED ORDER — POTASSIUM CHLORIDE CRYS ER 20 MEQ PO TBCR
40.0000 meq | EXTENDED_RELEASE_TABLET | Freq: Three times a day (TID) | ORAL | Status: AC
  Administered 2018-12-07 – 2018-12-08 (×3): 40 meq via ORAL
  Filled 2018-12-07 (×3): qty 2

## 2018-12-07 MED ORDER — GADOBUTROL 1 MMOL/ML IV SOLN
7.5000 mL | Freq: Once | INTRAVENOUS | Status: AC | PRN
  Administered 2018-12-07: 7.5 mL via INTRAVENOUS

## 2018-12-07 NOTE — Progress Notes (Signed)
Pt anxious and confused. Daughter hospitalized and is very anxious. High fall risk attempting to get out of bed every thirty minutes. Other interventions used and not effective; Bed alarm, medication, comfort measures, patient closer to nursing station, and redirection. Meds not effective. All physical and physiological needs addressed.  Schorr PA-C paged and made aware and recommended safety sitter 1: 1 observation.

## 2018-12-07 NOTE — Evaluation (Addendum)
Occupational Therapy Evaluation Patient Details Name: Heidi Jefferson MRN: [id number] DOB: 07/12/1923 Today's Date: 12/07/2018    History of Present Illness Pt is a 84 y/o female with a PMH significant for HTN, DM, COPD, CVA, CAD, PE who presents with elevated BP.    Clinical Impression   PTA, pt was living with her daughter and was performing ADLs and had an aide who performed IADLs. [facility] with RW; requiring increased assistance for toilet hygiene. Pt would benefit from further acute OT to facilitate safe dc. Pt presenting with decreased cognition and balance. Pending support at home, [facility] to optimize safety, independence with ADLs, and return to PLOF. However, pt's daughter may be unavailable to provide support and pt may need SNF for safety.     Follow Up Recommendations  Home health OT;Supervision/Assistance - 24 hour ; May require SNF if daughter unable to provide support.     Equipment Recommendations  None recommended by OT    Recommendations for Other Services PT consult     Precautions / Restrictions Precautions Precautions: Fall Precaution Comments: "Low fall risk" however pt demonstrated a higher risk for falls during session.  Restrictions Weight Bearing Restrictions: No      Mobility Bed Mobility Overal bed mobility: Needs Assistance Bed Mobility: Supine to Sit;Sit to Supine     Supine to sit: Min guard Sit to supine: Min guard   General bed mobility comments: Close guard for safety as pt transitions to/from EOB. HOB elevated and pt using railings for support. Increased time required  Transfers Overall transfer level: Needs assistance Equipment used: Rolling walker (2 wheeled) Transfers: Sit to/from Stand Sit to Stand: Min guard         General transfer comment: Min Guard A for safety    Balance Overall balance assessment: Needs  assistance Sitting-balance support: Feet supported;No upper extremity supported Sitting balance-Leahy Scale: Fair     Standing balance support: Bilateral upper extremity supported;During functional activity Standing balance-Leahy Scale: Fair Standing balance comment: Able to maintain static standing without UE support                           ADL either performed or assessed with clinical judgement   ADL Overall ADL's : Needs assistance/impaired Eating/Feeding: Independent;Sitting   Grooming: Supervision/safety;Set up;Standing;Wash/dry face;Wash/dry hands;Brushing hair   Upper Body Bathing: Supervision/ safety;Set up;Sitting   Lower Body Bathing: Min guard;Sit to/from stand   Upper Body Dressing : Min guard;Standing Upper Body Dressing Details (indicate cue type and reason): donned robe from home Lower Body Dressing: Min guard;Sit to/from stand Lower Body Dressing Details (indicate cue type and reason): donned bedroom slippers at EOB Toilet Transfer: Min guard;BSC;Ambulation;RW   Toileting- Clothing Manipulation and Hygiene: Minimal assistance;Sit to/from stand Toileting - Clothing Manipulation Details (indicate cue type and reason): Min A to manage mutiple clothing items     Functional mobility during ADLs: Min guard;Rolling walker;Supervision/safety General ADL Comments: Pt performing ADLs and functional mobility at [facility]-Min Guard A for safety     Vision Baseline Vision/History: Wears glasses Wears Glasses: At all times Patient Visual Report: No change from baseline       Perception     Praxis      Pertinent Vitals/Pain Pain Assessment: No/denies pain     Hand Dominance Right   Extremity/Trunk Assessment Upper Extremity Assessment Upper Extremity Assessment: Generalized weakness  Lower Extremity Assessment Lower Extremity Assessment: Generalized weakness   Cervical / Trunk Assessment Cervical / Trunk Assessment: Kyphotic    Communication Communication Communication: No difficulties   Cognition Arousal/Alertness: Awake/alert Behavior During Therapy: WFL for tasks assessed/performed Overall Cognitive Status: Impaired/Different from baseline Area of Impairment: Orientation;Attention;Memory;Following commands;Safety/judgement;Awareness;Problem solving                 Orientation Level: Disoriented to;Place;Time;Situation Current Attention Level: Selective Memory: Decreased short-term memory Following Commands: Follows one step commands consistently;Follows one step commands with increased time;Follows multi-step commands inconsistently Safety/Judgement: Decreased awareness of safety Awareness: Intellectual Problem Solving: Slow processing;Requires verbal cues General Comments: Pt reporting she doesn't know [facility], but [facility] is told [facility] in a [facility]. Pt stating it is abotu to be "2021". Requiring increased time and cues and presents with ST deficits   General Comments  RN notifying OT that pt's daughter is on 4E.     Exercises     Shoulder Instructions      Home Living Family/patient expects to be discharged to:: Private residence Living Arrangements: Children Available Help at Discharge: Family;Available 24 hours/day;Personal care attendant Type of Home: House Home Access: Stairs to enter Enter[employer] CorporationEntrance Stairs-Number of Steps: 5 Entrance Stairs-Rails: Right;Left Home Layout: One level     Bathroom Shower/Tub: Tub/shower unit         Home Equipment: Environmental consultantWalker - 2 wheels;Shower seat   Additional Comments: Pt is daughter's primary caregiver. States they do not have a car so they go to the grocery store with a neighbor sometimes.       Prior Functioning/Environment Level of Independence: Independent with assistive device(s)        Comments: Pt also has an aide coming in 5 days/week. Pt reports that the aide is only supposed to stay ~1 hour/day  but she stays longer to help them out. Aide mainly does housework and occasionally cooks.         OT Problem List: Decreased activity tolerance;Impaired balance (sitting and/or standing);Decreased safety awareness;Decreased cognition      OT Treatment/Interventions: Self-care/ADL training;Therapeutic exercise;Energy conservation;DME and/or AE instruction;Therapeutic activities;Patient/family education    OT Goals(Current goals can be found in the care plan section) Acute Rehab OT Goals Patient Stated Goal: "Return home with my daughter, Burna MortimerWanda" OT Goal Formulation: With patient Time For Goal Achievement: 12/21/18 Potential to Achieve Goals: Good ADL Goals Pt Will Perform Grooming: with modified independence;standing Pt Will Perform Lower Body Dressing: with modified independence;sit to/from stand Pt Will Transfer to Toilet: with modified independence;ambulating;bedside commode Pt Will Perform Toileting - Clothing Manipulation and hygiene: with modified independence;sit to/from stand  OT Frequency: Min 2X/week   Barriers to D/C: Decreased caregiver support  Pt's daughter is also hospitalized       Co-evaluation              AM-PAC OT "6 Clicks" Daily Activity     Outcome Measure Help from another person eating meals?: None Help from another person taking care of personal grooming?: A Little Help from another person toileting, which includes using toliet, bedpan, or urinal?: A Little Help from another person bathing (including washing, rinsing, drying)?: A Little Help from another person to put on and taking off regular upper body clothing?: A Little Help from another person to put on and taking off regular lower body clothing?: A Little 6 Click Score: 19   End of Session Equipment Utilized During Treatment: Gait belt;Rolling walker  Nurse Communication: Mobility status  Activity Tolerance: Patient tolerated treatment well Patient left: in chair;with call bell/phone within  reach;with chair alarm set;with nursing/sitter in room  OT Visit Diagnosis: Unsteadiness on feet (R26.81);Other abnormalities of gait and mobility (R26.89);Muscle weakness (generalized) (M62.81)                Time: 1610-9604 OT Time Calculation (min): 22 min Charges:  OT General Charges $OT Visit: 1 Visit OT Evaluation $OT Eval Moderate Complexity: 1 Mod  Jiyaan Steinhauser MSOT, OTR/L Acute Rehab Pager: [phone] Office: [phone]  Theodoro Grist Danyelle Brookover 12/07/2018, 9:09 AM

## 2018-12-07 NOTE — Plan of Care (Signed)

## 2018-12-07 NOTE — Progress Notes (Signed)
PROGRESS NOTE    Heidi Jefferson  ZOX:[id number] DOB: 1923-06-24 DOA: 12/05/2018 PCP: Alinda Deem, MD   Brief Narrative:  Heidi Kobler Watkinsis a 84 y.o.femalewith medical history significant ofhypertension, hyperlipidemia, diabetes mellitus, COPD, stroke, CAD, GERD, PE(2016not onoral anticoagulant), who presents with elevated blood pressure.  Weeks ago the patient was seen by Dr. Tomie China in [place] and her blood pressures were normal and her mentation was completely normal.  Today the patient does not know where she is she thinks she is in a big house, she thinks the year is 2021, she thinks the month is January, but she does know the president is Trump.  Patient states that she is unable to give me any information about her pharmacy or medication refills.  She apparently has no idea what is been going on with that.  This is also a significant change from having been evaluated by Dr. Tomie China just 3 weeks ago.  She has had a brain MRI performed today due to concerns for stroke given markedly elevated blood pressure readings, but this is without any acute intracranial abnormalities.   Assessment & Plan:   Principal Problem:   Hypertensive urgency Active Problems:   HLD (hyperlipidemia)   GERD (gastroesophageal reflux disease)   CAD (coronary artery disease)   History of stroke   COPD (chronic obstructive pulmonary disease) (HCC)   Hypokalemia  Hypertensive urgency-resolved: Bp 202/90-->168/80-->138/93. This is due lack of ability to get her medications initially thought in the emergency department but 3 weeks ago the patient was receiving her medications and completely normal when evaluated by Dr. Tomie China.  Blood pressure was stable. She hadminimal headache and a mild chest pain which has resolved. No focal neurologic findings on physical examination. Pt is taking lasix 40 mg daily, but not carediagnosis ofCHF.Her 2D echo on9/08/2018 showed EF of 65%. She has mild leg edema,  but no JVD or pulmonary edema onchest x-ray. Does not seem to have acute CHF.  -Can go to MedSurg at this point as inpatient -Bp: pt hasLasix 40 mg daily, amlodipine 10 mg daily, metoprolol 25 mg twice daily, losartan on her medication list.Her blood pressure has improved to 138/93 without any treatment. I will restart her amlodipine at 5 mg daily, continue metoprolol 25 mg bid, Lasix40 mg daily andirbesartan 37.5 mg daily -prnhydralazine -MRI brain with no signs of acute stroke  HLD (hyperlipidemia): -lipitor  GERD: -Pepcid  CAD (coronary artery disease): she had mild CPearlier, which has resolved. Most likely due to demand ischemia secondary to elevated blood pressure. -Continue aspirin, Lipitor, metoprolol - Troponin x3 negative patient has ruled out.  History of stroke: -Aspirin, Lipitor  COPD (chronic obstructive pulmonary disease) (HCC):Per ED physician, patient had mild wheezing initially. Patient was treated with bronchodilators and 2 g of magnesium sulfate in ED. Currently patient does not have wheezing or rhonchi on auscultation. Does not seem to have severe COPD exacerbation. - DuoNeb nebulizers, PRN albuterol nebulizers -PRN Mucinex for cough  Hypokalemia:  -Persistent and will replete orally with 3 doses of potassium -Recheck labs in a.m. along with magnesium  Concerning social issues:pt is 84 y/o and takes care of her bedridden daughter. Pt bedridden daughter takes care of pt medications. Pt and herdaughter can notdrive to pharmacy to retrieve medications. Herbedridden daughtermay potentially not be able to physically provide the care that patient needs. -will consult CM and SW for possible placementfor working on placement -Changed to inpatient    DVT prophylaxis: Lovenox Code Status: Full Family Communication: None  at bedside Disposition Plan: Continue potassium supplementation and blood pressure management with placement pending  per CSW.  Changed to inpatient.   Consultants:   None  Procedures:   None  Antimicrobials:   None   Subjective: Patient seen and evaluated today with no new acute complaints or concerns.  She is having breakfast this morning and was quite agitated overnight and currently has a sitter at bedside, but is much more calm this morning.  Objective: Vitals:   12/06/18 0801 12/06/18 1424 12/06/18 2146 12/07/18 0917  BP: (!) 181/79 (!) 169/65 (!) 154/60 118/71  Pulse: 88 78 92 77  Resp: 18 19 18 17   Temp: 98.6 F (37 C) 98.2 F (36.8 C) 97.9 F (36.6 C) 97.7 F (36.5 C)  TempSrc: Oral Oral Oral Oral  SpO2: 94% 95% 97% 94%  Weight:      Height:        Intake/Output Summary (Last 24 hours) at 12/07/2018 1210 Last data filed at 12/07/2018 [id number] Gross per 24 hour  Intake 300 ml  Output -  Net 300 ml   Filed Weights   12/05/18 1721  Weight: 74 kg    Examination:  General exam: Appears calm and comfortable  Respiratory system: Clear to auscultation. Respiratory effort normal. Cardiovascular system: S1 & S2 heard, RRR. No JVD, murmurs, rubs, gallops or clicks. No pedal edema. Gastrointestinal system: Abdomen is nondistended, soft and nontender. No organomegaly or masses felt. Normal bowel sounds heard. Central nervous system: Alert and oriented. No focal neurological deficits. Extremities: Symmetric 5 x 5 power. Skin: No rashes, lesions or ulcers Psychiatry: Judgement and insight appear normal. Mood & affect appropriate.     Data Reviewed: I have personally reviewed following labs and imaging studies  CBC: Recent Labs  Lab 12/05/18 1725  WBC 5.3  NEUTROABS 2.8  HGB 10.4*  HCT 34.0*  MCV 95.2  PLT 280   Basic Metabolic Panel: Recent Labs  Lab 12/05/18 1725 12/06/18 0545 12/07/18 0741  NA 144  --  143  K 2.8*  --  2.9*  CL 106  --  100  CO2 29  --  34*  GLUCOSE 99  --  109*  BUN 15  --  17  CREATININE 1.00  --  0.86  CALCIUM 8.3*  --  8.4*  MG  --   2.1  --    GFR: Estimated Creatinine Clearance: 37.6 mL/min (by C-G formula based on SCr of 0.86 mg/dL). Liver Function Tests: No results for input(s): AST, ALT, ALKPHOS, BILITOT, PROT, ALBUMIN in the last 168 hours. No results for input(s): LIPASE, AMYLASE in the last 168 hours. No results for input(s): AMMONIA in the last 168 hours. Coagulation Profile: No results for input(s): INR, PROTIME in the last 168 hours. Cardiac Enzymes: Recent Labs  Lab 12/05/18 1725 12/05/18 2338 12/06/18 0545 12/06/18 1116  TROPONINI <0.03 0.03* <0.03 <0.03   BNP (last 3 results) No results for input(s): PROBNP in the last 8760 hours. HbA1C: No results for input(s): HGBA1C in the last 72 hours. CBG: No results for input(s): GLUCAP in the last 168 hours. Lipid Profile: No results for input(s): CHOL, HDL, LDLCALC, TRIG, CHOLHDL, LDLDIRECT in the last 72 hours. Thyroid Function Tests: No results for input(s): TSH, T4TOTAL, FREET4, T3FREE, THYROIDAB in the last 72 hours. Anemia Panel: No results for input(s): VITAMINB12, FOLATE, FERRITIN, TIBC, IRON, RETICCTPCT in the last 72 hours. Sepsis Labs: No results for input(s): PROCALCITON, LATICACIDVEN in the last 168 hours.  No results  found for this or any previous visit (from the past 240 hour(s)).       Radiology Studies: Dg Chest 2 View  Result Date: 12/05/2018 CLINICAL DATA:  Dyspnea. EXAM: CHEST - 2 VIEW COMPARISON:  Radiographs of November 01, 2018. FINDINGS: Stable cardiomegaly. No pneumothorax is noted. Right lung is clear. Mild left basilar atelectasis is noted with small pleural effusion. Bony thorax is unremarkable. IMPRESSION: Mild left basilar atelectasis with small left pleural effusion. Electronically Signed   By: Lupita RaiderJames  Green Jr, M.D.   On: 12/05/2018 18:56   Mr Laqueta JeanBrain W WUWo Contrast  Result Date: 12/07/2018 CLINICAL DATA:  84 year old female with hypertension, 202 systolic. Mild headache and chest pain. EXAM: MRI HEAD WITHOUT AND  WITH CONTRAST TECHNIQUE: Multiplanar, multiecho pulse sequences of the brain and surrounding structures were obtained without and with intravenous contrast. CONTRAST:  7.5 milliliters Gadavist COMPARISON:  Head CT without contrast 08/13/2018 and earlier. Brain MRI 06/01/2008. FINDINGS: Brain: No restricted diffusion to suggest acute infarction. No midline shift, mass effect, evidence of mass lesion, ventriculomegaly, extra-axial collection or acute intracranial hemorrhage. Cervicomedullary junction and pituitary are within normal limits. Generalized cerebral volume loss since 2009. Mild for age scattered nonspecific cerebral white matter T2 and FLAIR hyperintensity. Similar mild T2 heterogeneity in the pons and bilateral deep gray matter nuclei. There are occasional chronic cerebral blood products including 2 micro hemorrhages (left anterior frontal lobe series 12, image 29) and a small area of superficial siderosis in the left superior frontal gyrus (image 37). No cortical encephalomalacia. No abnormal enhancement identified. No dural thickening Vascular: Major intracranial vascular flow voids are stable since 2009, the left vertebral artery is dominant. The major dural venous sinuses are enhancing and appear to be patent. Skull and upper cervical spine: Negative visible cervical spine. Normal bone marrow signal. Sinuses/Orbits: Stable and negative. Other: Mastoids remain clear. Visible internal auditory structures appear normal. Scalp and face soft tissues appear negative. IMPRESSION: 1.  No acute intracranial abnormality. 2. Occasional chronic cerebral blood products, including two hemispheric chronic micro-hemorrhages and a small area of left superior frontal gyrus superficial siderosis. Otherwise mild for age signal changes in the brain which are nonspecific but most commonly due to chronic small vessel disease. Electronically Signed   By: Odessa FlemingH  Hall M.D.   On: 12/07/2018 11:52        Scheduled Meds: .  amLODipine  5 mg Oral Daily  . aspirin  81 mg Oral Daily  . atorvastatin  20 mg Oral Daily  . calcium-vitamin D  1 tablet Oral Daily  . cholecalciferol  1,000 Units Oral Daily  . enoxaparin (LOVENOX) injection  40 mg Subcutaneous Q24H  . famotidine  20 mg Oral QHS  . fluticasone  1-2 spray Each Nare Daily  . furosemide  40 mg Oral Daily  . irbesartan  37.5 mg Oral Daily  . metoprolol tartrate  25 mg Oral BID  . montelukast  10 mg Oral QHS  . potassium chloride  40 mEq Oral TID   Continuous Infusions:   LOS: 0 days    Time spent: 30 minutes    Felicie Kocher Hoover BrunetteD Shelisha Gautier, DO [facility] Pager [phone]  If 7PM-7AM, please contact night-coverage [email or web address] Password [email or web address] 12/07/2018, 12:10 PM

## 2018-12-07 NOTE — Progress Notes (Signed)
Patient resting in bed with eyes closed.  No s/s of distress or discomfort noted.  Sitter remains at bedside.  No PIV at this time and was informed MD aware and ok with no access at this time.  Will continue to monitor.

## 2018-12-08 LAB — CBC
HCT: 35.8 % — ABNORMAL LOW (ref 36.0–46.0)
Hemoglobin: 10.9 g/dL — ABNORMAL LOW (ref 12.0–15.0)
MCH: 28.3 pg (ref 26.0–34.0)
MCHC: 30.4 g/dL (ref 30.0–36.0)
MCV: 93 fL (ref 80.0–100.0)
Platelets: 273 10*3/uL (ref 150–400)
RBC: 3.85 MIL/uL — ABNORMAL LOW (ref 3.87–5.11)
RDW: 22 % — AB (ref 11.5–15.5)
WBC: 4.5 10*3/uL (ref 4.0–10.5)
nRBC: 0 % (ref 0.0–0.2)

## 2018-12-08 LAB — BASIC METABOLIC PANEL
Anion gap: 7 (ref 5–15)
BUN: 18 mg/dL (ref 8–23)
CALCIUM: 8.6 mg/dL — AB (ref 8.9–10.3)
CO2: 34 mmol/L — ABNORMAL HIGH (ref 22–32)
Chloride: 100 mmol/L (ref 98–111)
Creatinine, Ser: 0.81 mg/dL (ref 0.44–1.00)
GFR calc Af Amer: >60 mL/min (ref >60)
GFR calc non Af Amer: >60 mL/min (ref >60)
Glucose, Bld: 122 mg/dL — ABNORMAL HIGH (ref 70–99)
Potassium: 3.8 mmol/L (ref 3.5–5.1)
SODIUM: 141 mmol/L (ref 135–145)

## 2018-12-08 LAB — AMMONIA: Ammonia: 23 umol/L (ref 9–35)

## 2018-12-08 LAB — VITAMIN B12: Vitamin B-12: 192 pg/mL (ref 180–914)

## 2018-12-08 LAB — TSH: TSH: 2.443 u[IU]/mL (ref 0.350–4.500)

## 2018-12-08 MED ORDER — VITAMIN B-12 100 MCG PO TABS
100.0000 ug | ORAL_TABLET | Freq: Every day | ORAL | Status: DC
  Administered 2018-12-09 – 2018-12-10 (×2): 100 ug via ORAL
  Filled 2018-12-08 (×2): qty 1

## 2018-12-08 NOTE — Progress Notes (Signed)
PROGRESS NOTE    [name]  ZOX:[id number]:[id number] DOB: 03/18/1923 DOA: 12/05/2018 PCP: Heidi Jefferson, [name], MD   Brief Narrative:  85 year old with history of essential hypertension, hyperlipidemia, diabetes mellitus type 2, CVA, COPD, GERD, CAD, history of PE not on anticoagulation came to the hospital with complains of elevated blood pressure but also over the course last 2 or 3 weeks her family has noticed decline in her mental status with increasing confusion.  With improvement of the blood pressure her symptoms are also improved in terms of mentation.  No focal neurologic deficit.  Physical therapy recommended skilled nursing facility.   Assessment & Plan:   Principal Problem:   Hypertensive urgency Active Problems:   HLD (hyperlipidemia)   GERD (gastroesophageal reflux disease)   CAD (coronary artery disease)   History of stroke   COPD (chronic obstructive pulmonary disease) (HCC)   Hypokalemia  Hypertensive urgency, resolved Acute metabolic encephalopathy, resolved -Currently patient blood pressure has greatly improved.  Would continue current blood pressure medication regimen including Lasix, Norvasc, metoprolol, ibesartan. -MRI of the brain is negative for any acute CVA -Physical therapy recommends skilled nursing facility versus home health, given patient has minimal help at home and her advanced age.  She would benefit from skilled nursing facility.  I think she will make good improvement when she goes to SNF. - TSH-within normal limits, ammonia level-within normal limits -B12-borderline low, will start supplements  Essential hypertension - Currently on Norvasc 5 mg orally daily, Lasix 40 mg daily, ibesartan 37.5 mg orally daily, metoprolol 50 mg twice daily  Hyperlipidemia -Continue atorvastatin 20 mg daily  GERD -Pepcid  CAD -Currently patient is chest pain-free.  Continue aspirin, statin, beta-blocker, ARB  COPD -Bronchodilators PRN  CVA -Continue aspirin and  statin  DVT prophylaxis: Lovenox Code Status: Full code Family Communication: Spoke with the patient's daughter over the phone.  She tells me that she is unable to take care of patient given her own medical issues. Disposition Plan: To be determined  Consultants:   None  Procedures:   None  Antimicrobials:   None   Subjective: Patient appears to be little better this morning and she is ambulating with minimal assistance.  She is able to self feed herself and mentating better.  Review of Systems Otherwise negative except as per HPI, including: General: Denies fever, chills, night sweats or unintended weight loss. Resp: Denies cough, wheezing, shortness of breath. Cardiac: Denies chest pain, palpitations, orthopnea, paroxysmal nocturnal dyspnea. GI: Denies abdominal pain, nausea, vomiting, diarrhea or constipation GU: Denies dysuria, frequency, hesitancy or incontinence MS: Denies muscle aches, joint pain or swelling Neuro: Denies headache, neurologic deficits (focal weakness, numbness, tingling), abnormal gait Psych: Denies anxiety, depression, SI/HI/AVH Skin: Denies new rashes or lesions ID: Denies sick contacts, exotic exposures, travel  Objective: Vitals:   12/07/18 1300 12/07/18 1749 12/08/18 0004 12/08/18 0925  BP: 135/71 (!) 130/91 (!) 159/66 (!) 160/59  Pulse: 75 70 72 72  Resp: 17 16  16   Temp: 98.2 F (36.8 C) 98.7 F (37.1 C) 98.3 F (36.8 C) 98.4 F (36.9 C)  TempSrc: Oral Oral Oral Oral  SpO2: 93% 92% 92% 91%  Weight:      Height:        Intake/Output Summary (Last 24 hours) at 12/08/2018 1218 Last data filed at 12/08/2018 0900 Gross per 24 hour  Intake 1080 ml  Output -  Net 1080 ml   Filed Weights   12/05/18 1721  Weight: 74 kg  Examination:  General exam: Appears calm and comfortable, elderly frail Respiratory system: Clear to auscultation. Respiratory effort normal. Cardiovascular system: S1 & S2 heard, RRR. No JVD, murmurs, rubs,  gallops or clicks. No pedal edema. Gastrointestinal system: Abdomen is nondistended, soft and nontender. No organomegaly or masses felt. Normal bowel sounds heard. Central nervous system: Alert and oriented. No focal neurological deficits. Extremities: Symmetric 4 x 5 power. Skin: No rashes, lesions or ulcers Psychiatry: Judgement and insight appear normal. Mood & affect appropriate.     Data Reviewed:   CBC: Recent Labs  Lab 12/05/18 1725 12/08/18 0405  WBC 5.3 4.5  NEUTROABS 2.8  --   HGB 10.4* 10.9*  HCT 34.0* 35.8*  MCV 95.2 93.0  PLT 280 273   Basic Metabolic Panel: Recent Labs  Lab 12/05/18 1725 12/06/18 0545 12/07/18 0741 12/08/18 0405  NA 144  --  143 141  K 2.8*  --  2.9* 3.8  CL 106  --  100 100  CO2 29  --  34* 34*  GLUCOSE 99  --  109* 122*  BUN 15  --  17 18  CREATININE 1.00  --  0.86 0.81  CALCIUM 8.3*  --  8.4* 8.6*  MG  --  2.1 2.0  --    GFR: Estimated Creatinine Clearance: 39.9 mL/min (by C-G formula based on SCr of 0.81 mg/dL). Liver Function Tests: No results for input(s): AST, ALT, ALKPHOS, BILITOT, PROT, ALBUMIN in the last 168 hours. No results for input(s): LIPASE, AMYLASE in the last 168 hours. Recent Labs  Lab 12/08/18 0929  AMMONIA 23   Coagulation Profile: No results for input(s): INR, PROTIME in the last 168 hours. Cardiac Enzymes: Recent Labs  Lab 12/05/18 1725 12/05/18 2338 12/06/18 0545 12/06/18 1116  TROPONINI <0.03 0.03* <0.03 <0.03   BNP (last 3 results) No results for input(s): PROBNP in the last 8760 hours. HbA1C: No results for input(s): HGBA1C in the last 72 hours. CBG: No results for input(s): GLUCAP in the last 168 hours. Lipid Profile: No results for input(s): CHOL, HDL, LDLCALC, TRIG, CHOLHDL, LDLDIRECT in the last 72 hours. Thyroid Function Tests: Recent Labs    12/08/18 0929  TSH 2.443   Anemia Panel: Recent Labs    12/08/18 0929  VITAMINB12 192   Sepsis Labs: No results for input(s):  PROCALCITON, LATICACIDVEN in the last 168 hours.  No results found for this or any previous visit (from the past 240 hour(s)).       Radiology Studies: Mr Laqueta JeanBrain W Wo Contrast  Result Date: 12/07/2018 CLINICAL DATA:  85 year old female with hypertension, 202 systolic. Mild headache and chest pain. EXAM: MRI HEAD WITHOUT AND WITH CONTRAST TECHNIQUE: Multiplanar, multiecho pulse sequences of the brain and surrounding structures were obtained without and with intravenous contrast. CONTRAST:  7.5 milliliters Gadavist COMPARISON:  Head CT without contrast 08/13/2018 and earlier. Brain MRI 06/01/2008. FINDINGS: Brain: No restricted diffusion to suggest acute infarction. No midline shift, mass effect, evidence of mass lesion, ventriculomegaly, extra-axial collection or acute intracranial hemorrhage. Cervicomedullary junction and pituitary are within normal limits. Generalized cerebral volume loss since 2009. Mild for age scattered nonspecific cerebral white matter T2 and FLAIR hyperintensity. Similar mild T2 heterogeneity in the pons and bilateral deep gray matter nuclei. There are occasional chronic cerebral blood products including 2 micro hemorrhages (left anterior frontal lobe series 12, image 29) and a small area of superficial siderosis in the left superior frontal gyrus (image 37). No cortical encephalomalacia. No abnormal enhancement identified.  No dural thickening Vascular: Major intracranial vascular flow voids are stable since 2009, the left vertebral artery is dominant. The major dural venous sinuses are enhancing and appear to be patent. Skull and upper cervical spine: Negative visible cervical spine. Normal bone marrow signal. Sinuses/Orbits: Stable and negative. Other: Mastoids remain clear. Visible internal auditory structures appear normal. Scalp and face soft tissues appear negative. IMPRESSION: 1.  No acute intracranial abnormality. 2. Occasional chronic cerebral blood products, including two  hemispheric chronic micro-hemorrhages and a small area of left superior frontal gyrus superficial siderosis. Otherwise mild for age signal changes in the brain which are nonspecific but most commonly due to chronic small vessel disease. Electronically Signed   By: Odessa Fleming M.D.   On: 12/07/2018 11:52        Scheduled Meds: . amLODipine  5 mg Oral Daily  . aspirin  81 mg Oral Daily  . atorvastatin  20 mg Oral Daily  . calcium-vitamin D  1 tablet Oral Daily  . cholecalciferol  1,000 Units Oral Daily  . enoxaparin (LOVENOX) injection  40 mg Subcutaneous Q24H  . famotidine  20 mg Oral QHS  . fluticasone  1-2 spray Each Nare Daily  . furosemide  40 mg Oral Daily  . irbesartan  37.5 mg Oral Daily  . metoprolol tartrate  25 mg Oral BID  . montelukast  10 mg Oral QHS   Continuous Infusions:   LOS: 1 day   Time spent= 35 mins    Marian Meneely Joline Maxcy, MD [facility] Pager [phone]   If 7PM-7AM, please contact night-coverage [email or web address] Password [facility] 12/08/2018, 12:18 PM

## 2018-12-08 NOTE — Progress Notes (Addendum)
Pt being admitted and will go to [address], [id number]. Report given to Matthews, Charity fundraiser. Will contact pt's daughter Phalon Katsaros who is a pt currently on unit [phone], which pt is aware and wants nurse to call her daughter with her new room number.  After contacting daughter, will transfer pt with her belongings over to [phone].   Called [address] to contact pt's daughter, pt's daughter has been discharged already to home today by [facility] (approx 30 miinutes ago) Will attempt calling daughter's cell phone number. Called pt's daughter Altamease Oiler phone as listed in chart, [phone], no answer. Informed pt will attempt to call number again shortly, and informed receiving 5 Chad RN Liz Beach that daughter had not been reached yet, if he would try also .   At 1645 Pt transferred via wheelchair with her belongings to [phone], escorted by SWOT RN.

## 2018-12-09 LAB — BASIC METABOLIC PANEL
Anion gap: 12 (ref 5–15)
BUN: 18 mg/dL (ref 8–23)
CO2: 30 mmol/L (ref 22–32)
Calcium: 8.7 mg/dL — ABNORMAL LOW (ref 8.9–10.3)
Chloride: 100 mmol/L (ref 98–111)
Creatinine, Ser: 0.76 mg/dL (ref 0.44–1.00)
GFR calc Af Amer: >60 mL/min (ref >60)
GFR calc non Af Amer: >60 mL/min (ref >60)
Glucose, Bld: 115 mg/dL — ABNORMAL HIGH (ref 70–99)
Potassium: 4.5 mmol/L (ref 3.5–5.1)
Sodium: 142 mmol/L (ref 135–145)

## 2018-12-09 LAB — TROPONIN I: Troponin I: <0.03 ng/mL (ref <0.03)

## 2018-12-09 LAB — FOLATE RBC
FOLATE, HEMOLYSATE: 402.2 ng/mL
Folate, RBC: 1234 ng/mL (ref >498)
Hematocrit: 32.6 % — ABNORMAL LOW (ref 34.0–46.6)

## 2018-12-09 LAB — MAGNESIUM: Magnesium: 1.9 mg/dL (ref 1.7–2.4)

## 2018-12-09 MED ORDER — AMLODIPINE BESYLATE 10 MG PO TABS
10.0000 mg | ORAL_TABLET | Freq: Every day | ORAL | Status: DC
  Administered 2018-12-10: 10 mg via ORAL
  Filled 2018-12-09: qty 1

## 2018-12-09 NOTE — Progress Notes (Signed)
PROGRESS NOTE    Heidi Jefferson  WUJ:[id number] DOB: 03/06/1923 DOA: 12/05/2018 PCP: Alinda DeemPenner, [name], MD   Brief Narrative:  85 year old with history of essential hypertension, hyperlipidemia, diabetes mellitus type 2, CVA, COPD, GERD, CAD, history of PE not on anticoagulation came to the hospital with complains of elevated blood pressure but also over the course last 2 or 3 weeks her family has noticed decline in her mental status with increasing confusion.  With improvement of the blood pressure her symptoms are also improved in terms of mentation.  No focal neurologic deficit.  Physical therapy recommended skilled nursing facility.   Assessment & Plan:   Principal Problem:   Hypertensive urgency Active Problems:   HLD (hyperlipidemia)   GERD (gastroesophageal reflux disease)   CAD (coronary artery disease)   History of stroke   COPD (chronic obstructive pulmonary disease) (HCC)   Hypokalemia  Hypertensive urgency, resolved Acute metabolic encephalopathy, resolved -Blood pressure slightly elevated.  Would continue current blood pressure medication regimen including Lasix, metoprolol, ibesartan.  Increase Norvasc to 10 mg daily -MRI of the brain is negative for any acute CVA -Physical therapy recommends skilled nursing facility versus home health, given patient has minimal help at home and her advanced age.  She would benefit from skilled nursing facility.  I think she will make good improvement when she goes to SNF. - TSH-within normal limits, ammonia level-within normal limits -B12-borderline low, supplements have been started  Essential hypertension - Currently on Norvasc 5 mg orally daily, Lasix 40 mg daily, ibesartan 37.5 mg orally daily, metoprolol 50 mg twice daily  Hyperlipidemia -Continue atorvastatin 20 mg daily  GERD -Pepcid  CAD -Had episode of chest pain overnight, this morning chest pain-free.  Continue aspirin, statin, beta-blocker,  ARB  COPD -Bronchodilators PRN  CVA -Continue aspirin and statin  DVT prophylaxis: Lovenox Code Status: Full code Family Communication: None at bedside Disposition Plan: Placement at skilled nursing facility  Consultants:   None  Procedures:   None  Antimicrobials:   None   Subjective: Overnight patient had episode of chest pain, this morning she is chest pain-free eating her breakfast without any issues.  Review of Systems Otherwise negative except as per HPI, including: General = no fevers, chills, dizziness, malaise, fatigue HEENT/EYES = negative for pain, redness, loss of vision, double vision, blurred vision, loss of hearing, sore throat, hoarseness, dysphagia Cardiovascular= negative for chest pain, palpitation, murmurs, lower extremity swelling Respiratory/lungs= negative for shortness of breath, cough, hemoptysis, wheezing, mucus production Gastrointestinal= negative for nausea, vomiting,, abdominal pain, melena, hematemesis Genitourinary= negative for Dysuria, Hematuria, Change in Urinary Frequency MSK = Negative for arthralgia, myalgias, Back Pain, Joint swelling  Neurology= Negative for headache, seizures, numbness, tingling  Psychiatry= Negative for anxiety, depression, suicidal and homocidal ideation Allergy/Immunology= Medication/Food allergy as listed  Skin= Negative for Rash, lesions, ulcers, itching  Objective: Vitals:   12/08/18 1700 12/08/18 2210 12/09/18 0232 12/09/18 0536  BP: (!) 165/73 (!) 156/70 (!) 149/77 (!) 173/70  Pulse: 85 89 72 71  Resp: 18 19  19   Temp: 99.7 F (37.6 C) 98.3 F (36.8 C) 98.4 F (36.9 C) 98 F (36.7 C)  TempSrc: Oral Oral Oral Oral  SpO2: 98% 92% 99% 97%  Weight: 67.9 kg     Height: 5\' 1"  (1.549 m)       Intake/Output Summary (Last 24 hours) at 12/09/2018 1015 Last data filed at 12/09/2018 0905 Gross per 24 hour  Intake 360 ml  Output -  Net 360 ml  Filed Weights   12/05/18 1721 12/08/18 1700  Weight: 74  kg 67.9 kg    Examination:  Constitutional: NAD, calm, comfortable, elderly frail-appearing Eyes: PERRL, lids and conjunctivae normal ENMT: Mucous membranes are moist. Posterior pharynx clear of any exudate or lesions.Normal dentition.  Neck: normal, supple, no masses, no thyromegaly Respiratory: clear to auscultation bilaterally, no wheezing, no crackles. Normal respiratory effort. No accessory muscle use.  Cardiovascular: Regular rate and rhythm, no murmurs / rubs / gallops. No extremity edema. 2+ pedal pulses. No carotid bruits.  Abdomen: no tenderness, no masses palpated. No hepatosplenomegaly. Bowel sounds positive.  Musculoskeletal: no clubbing / cyanosis. No joint deformity upper and lower extremities. Good ROM, no contractures. Normal muscle tone.  Skin: no rashes, lesions, ulcers. No induration Neurologic: CN 2-12 grossly intact. Sensation intact, DTR normal. Strength 4/5 in all 4.  Psychiatric: Normal judgment and insight. Alert and oriented x 3. Normal mood.    Data Reviewed:   CBC: Recent Labs  Lab 12/05/18 1725 12/08/18 0405  WBC 5.3 4.5  NEUTROABS 2.8  --   HGB 10.4* 10.9*  HCT 34.0* 35.8*  MCV 95.2 93.0  PLT 280 273   Basic Metabolic Panel: Recent Labs  Lab 12/05/18 1725 12/06/18 0545 12/07/18 0741 12/08/18 0405 12/09/18 0416  NA 144  --  143 141 142  K 2.8*  --  2.9* 3.8 4.5  CL 106  --  100 100 100  CO2 29  --  34* 34* 30  GLUCOSE 99  --  109* 122* 115*  BUN 15  --  17 18 18   CREATININE 1.00  --  0.86 0.81 0.76  CALCIUM 8.3*  --  8.4* 8.6* 8.7*  MG  --  2.1 2.0  --  1.9   GFR: Estimated Creatinine Clearance: 38.7 mL/min (by C-G formula based on SCr of 0.76 mg/dL). Liver Function Tests: No results for input(s): AST, ALT, ALKPHOS, BILITOT, PROT, ALBUMIN in the last 168 hours. No results for input(s): LIPASE, AMYLASE in the last 168 hours. Recent Labs  Lab 12/08/18 0929  AMMONIA 23   Coagulation Profile: No results for input(s): INR, PROTIME  in the last 168 hours. Cardiac Enzymes: Recent Labs  Lab 12/05/18 1725 12/05/18 2338 12/06/18 0545 12/06/18 1116 12/09/18 0416  TROPONINI <0.03 0.03* <0.03 <0.03 <0.03   BNP (last 3 results) No results for input(s): PROBNP in the last 8760 hours. HbA1C: No results for input(s): HGBA1C in the last 72 hours. CBG: No results for input(s): GLUCAP in the last 168 hours. Lipid Profile: No results for input(s): CHOL, HDL, LDLCALC, TRIG, CHOLHDL, LDLDIRECT in the last 72 hours. Thyroid Function Tests: Recent Labs    12/08/18 0929  TSH 2.443   Anemia Panel: Recent Labs    12/08/18 0929  VITAMINB12 192   Sepsis Labs: No results for input(s): PROCALCITON, LATICACIDVEN in the last 168 hours.  No results found for this or any previous visit (from the past 240 hour(s)).       Radiology Studies: Mr Laqueta Jean Wo Contrast  Result Date: 12/07/2018 CLINICAL DATA:  85 year old female with hypertension, 202 systolic. Mild headache and chest pain. EXAM: MRI HEAD WITHOUT AND WITH CONTRAST TECHNIQUE: Multiplanar, multiecho pulse sequences of the brain and surrounding structures were obtained without and with intravenous contrast. CONTRAST:  7.5 milliliters Gadavist COMPARISON:  Head CT without contrast 08/13/2018 and earlier. Brain MRI 06/01/2008. FINDINGS: Brain: No restricted diffusion to suggest acute infarction. No midline shift, mass effect, evidence of mass lesion, ventriculomegaly,  extra-axial collection or acute intracranial hemorrhage. Cervicomedullary junction and pituitary are within normal limits. Generalized cerebral volume loss since 2009. Mild for age scattered nonspecific cerebral white matter T2 and FLAIR hyperintensity. Similar mild T2 heterogeneity in the pons and bilateral deep gray matter nuclei. There are occasional chronic cerebral blood products including 2 micro hemorrhages (left anterior frontal lobe series 12, image 29) and a small area of superficial siderosis in the  left superior frontal gyrus (image 37). No cortical encephalomalacia. No abnormal enhancement identified. No dural thickening Vascular: Major intracranial vascular flow voids are stable since 2009, the left vertebral artery is dominant. The major dural venous sinuses are enhancing and appear to be patent. Skull and upper cervical spine: Negative visible cervical spine. Normal bone marrow signal. Sinuses/Orbits: Stable and negative. Other: Mastoids remain clear. Visible internal auditory structures appear normal. Scalp and face soft tissues appear negative. IMPRESSION: 1.  No acute intracranial abnormality. 2. Occasional chronic cerebral blood products, including two hemispheric chronic micro-hemorrhages and a small area of left superior frontal gyrus superficial siderosis. Otherwise mild for age signal changes in the brain which are nonspecific but most commonly due to chronic small vessel disease. Electronically Signed   By: Odessa Fleming M.D.   On: 12/07/2018 11:52        Scheduled Meds: . amLODipine  5 mg Oral Daily  . aspirin  81 mg Oral Daily  . atorvastatin  20 mg Oral Daily  . calcium-vitamin D  1 tablet Oral Daily  . cholecalciferol  1,000 Units Oral Daily  . enoxaparin (LOVENOX) injection  40 mg Subcutaneous Q24H  . famotidine  20 mg Oral QHS  . fluticasone  1-2 spray Each Nare Daily  . furosemide  40 mg Oral Daily  . irbesartan  37.5 mg Oral Daily  . metoprolol tartrate  25 mg Oral BID  . montelukast  10 mg Oral QHS  . vitamin B-12  100 mcg Oral Daily   Continuous Infusions:   LOS: 2 days   Time spent= 25 mins     Joline Maxcy, MD [facility] Pager [phone]   If 7PM-7AM, please contact night-coverage [email or web address] Password [email or web address] 12/09/2018, 10:15 AM

## 2018-12-09 NOTE — Progress Notes (Signed)
Pt still c/oing of chest pain 5/10 state pain is better not as intense. Dr. Clearence Ped paged again.

## 2018-12-09 NOTE — Progress Notes (Signed)
CSW updated patient's daughter that we are awaiting response from [facility]. If they are unable to accept patient, daughter's second choice is [facility]. They are able to accept patient tomorrow.   Osborne Casco Sai Zinn LCSW [phone]

## 2018-12-09 NOTE — Clinical Social Work Note (Signed)
Clinical Social Work Assessment  Patient Details  Name: Heidi Jefferson MRN: [id number] Date of Birth: December 11, 1922  Date of referral:  12/07/18               Reason for consult:  Facility Placement                Permission sought to share information with:  Facility Sport and exercise psychologist, Family Supports Permission granted to share information::  Yes, Verbal Permission Granted  Name::     Armed forces logistics/support/administrative officer::  SNF  Relationship::  Daughter  Contact Information:     Housing/Transportation Living arrangements for the past 2 months:  [place] of Information:  Patient, Medical Team, Adult Children Patient Interpreter Needed:  None Criminal Activity/Legal Involvement Pertinent to Current Situation/Hospitalization:  No - Comment as needed Significant Relationships:  Adult Children Lives with:  Self, Adult Children Do you feel safe going back to the place where you live?  Yes Need for family participation in patient care:  Yes (Comment)  Care giving concerns:  Patient from home with daughter, but there are mixed reports about the home situation. Patient reports that she provides care for her daughter, and daughter reports that she cares for the patient. Patient currently recommended for SNF placement at discharge.   Social Worker assessment / plan:  CSW met with patient's daughter who was also hospitalized to discuss placement. Patient's daughter discussed how patient has been at [facility] in [place] before, and would like for her to return there. CSW to fax referral. CSW met with patient to discuss conversation with patient's daughter. CSW also reached out to [facility], who is following patient's daughter for home health, to obtain information about living situation. Per [facility] nurses, the patient and daughter both live in the home together but they are both noncompliant with medical recommendations and it's like "the blind leading the blind". CSW updated RNCM with information and  coordinated information with patient's daughter's medical team, as well.  Employment status:  Retired Forensic scientist:  Medicare PT Recommendations:  [place] / Referral to community resources:  [place]  Patient/Family's Response to care:  Patient's daughter agreeable to SNF placement, hopeful for [facility] in [place].  Patient/Family's Understanding of and Emotional Response to Diagnosis, Current Treatment, and Prognosis:  Patient's daughter appears to understand that the patient needs some additional help as she says she's been having her own difficulties with her own health issues. Patient discussed how she is more worried about her daughter and what's going to happen to her daughter if she's not home to take care of her, but she knows that she needs to get better, too.   Emotional Assessment Appearance:  Appears stated age Attitude/Demeanor/Rapport:  Engaged Affect (typically observed):  Pleasant Orientation:  Oriented to Self Alcohol / Substance use:  Not Applicable Psych involvement (Current and /or in the community):  No (Comment)  Discharge Needs  Concerns to be addressed:  Care Coordination Readmission within the last 30 days:  No Current discharge risk:  Physical Impairment, Cognitively Impaired, Dependent with Mobility Barriers to Discharge:  Continued Medical Work up, [place], [place] 12/09/2018, 12:05 PM

## 2018-12-09 NOTE — NC FL2 (Signed)
[place] MEDICAID FL2 LEVEL OF CARE SCREENING TOOL     IDENTIFICATION  Patient Name: Heidi Jefferson Birthdate: 1923/10/15 Sex: female Admission Date (Current Location): 12/05/2018  [facility] and [place] Number:  [employer] and Address:  [facility] [place], [address], [place], [place] [address]      Provider Number: [id number]  Attending Physician Name and Address:  Dimple Nanas, MD  Relative Name and Phone Number:       Current Level of Care: Hospital Recommended Level of Care: Skilled Nursing Facility Prior Approval Number:    Date Approved/Denied:   PASRR Number: [id number]  Discharge Plan: SNF    Current Diagnoses: Patient Active Problem List   Diagnosis Date Noted  . Hypertensive urgency 12/05/2018  . Hypokalemia 12/05/2018  . Aortic stenosis 09/25/2018  . Pulmonary emboli (HCC) 12/14/2014  . HLD (hyperlipidemia) 12/14/2014  . GERD (gastroesophageal reflux disease) 12/14/2014  . CAD (coronary artery disease) 12/14/2014  . History of stroke 12/14/2014  . COPD (chronic obstructive pulmonary disease) (HCC) 12/14/2014  . Acute pulmonary embolism (HCC) 12/14/2014  . PE (pulmonary embolism) 12/14/2014    Orientation RESPIRATION BLADDER Height & Weight     Self, Time, Place  O2(see DC summary) Continent Weight: 149 lb 11.1 oz (67.9 kg) Height:  5\' 1"  (154.9 cm)  BEHAVIORAL SYMPTOMS/MOOD NEUROLOGICAL BOWEL NUTRITION STATUS      Continent Diet(see DC summary)  AMBULATORY STATUS COMMUNICATION OF NEEDS Skin   Limited Assist Verbally Normal                       Personal Care Assistance Level of Assistance  Bathing, Feeding, Dressing Bathing Assistance: Limited assistance Feeding assistance: Independent Dressing Assistance: Limited assistance     Functional Limitations Info  Sight, Hearing, Speech Sight Info: Adequate Hearing Info: Adequate Speech Info: Adequate    SPECIAL CARE FACTORS FREQUENCY  PT (By  licensed PT), OT (By licensed OT)     PT Frequency: 5x/wk OT Frequency: 5x/wk            Contractures Contractures Info: Not present    Additional Factors Info  Code Status, Allergies Code Status Info: Full Allergies Info: Mustard Allyl Isothiocyanate, Zestril Lisinopril, Zithromax Azithromycin, Codeine, Novocain Procaine           Current Medications (12/09/2018):  This is the current hospital active medication list Current Facility-Administered Medications  Medication Dose Route Frequency Provider Last Rate Last Dose  . acetaminophen (TYLENOL) tablet 650 mg  650 mg Oral Q6H PRN Lorretta Harp, MD   650 mg at 12/06/18 2148   Or  . acetaminophen (TYLENOL) suppository 650 mg  650 mg Rectal Q6H PRN Lorretta Harp, MD      . albuterol (PROVENTIL) (2.5 MG/3ML) 0.083% nebulizer solution 2.5 mg  2.5 mg Nebulization Q4H PRN Lorretta Harp, MD      . Melene Muller ON 12/10/2018] amLODipine (NORVASC) tablet 10 mg  10 mg Oral Daily Amin, Ankit Chirag, MD      . aspirin chewable tablet 81 mg  81 mg Oral Daily Lorretta Harp, MD   81 mg at 12/09/18 1006  . atorvastatin (LIPITOR) tablet 20 mg  20 mg Oral Daily Lorretta Harp, MD   20 mg at 12/09/18 1006  . calcium-vitamin D (OSCAL WITH D) 500-200 MG-UNIT per tablet 1 tablet  1 tablet Oral Daily Lorretta Harp, MD   1 tablet at 12/09/18 1006  . cholecalciferol (VITAMIN D3) tablet 1,000 Units  1,000 Units Oral Daily Lorretta Harp, MD   1,000 Units at 12/09/18 1006  . dextromethorphan-guaiFENesin (MUCINEX DM) 30-600 MG per 12 hr tablet 1 tablet  1 tablet Oral BID PRN Lorretta Harp, MD      . enoxaparin (LOVENOX) injection 40 mg  40 mg Subcutaneous Q24H Lorretta Harp, MD   40 mg at 12/08/18 1244  . famotidine (PEPCID) tablet 20 mg  20 mg Oral QHS Lorretta Harp, MD   20 mg at 12/08/18 2224  . fluticasone (FLONASE) 50 MCG/ACT nasal spray 1-2 spray  1-2 spray Each Nare Daily Lorretta Harp, MD   1 spray at 12/09/18 1010  . furosemide (LASIX) tablet 40 mg  40 mg Oral Daily Lorretta Harp, MD   40 mg at  12/09/18 1007  . hydrALAZINE (APRESOLINE) injection 5 mg  5 mg Intravenous Q2H PRN Lorretta Harp, MD      . irbesartan (AVAPRO) tablet 37.5 mg  37.5 mg Oral Daily Lorretta Harp, MD   37.5 mg at 12/09/18 1007  . metoprolol tartrate (LOPRESSOR) tablet 25 mg  25 mg Oral BID Lorretta Harp, MD   25 mg at 12/09/18 1006  . montelukast (SINGULAIR) tablet 10 mg  10 mg Oral QHS Lorretta Harp, MD   10 mg at 12/08/18 2224  . ondansetron (ZOFRAN) tablet 4 mg  4 mg Oral Q6H PRN Lorretta Harp, MD       Or  . ondansetron (ZOFRAN) injection 4 mg  4 mg Intravenous Q6H PRN Lorretta Harp, MD      . senna-docusate (Senokot-S) tablet 1 tablet  1 tablet Oral QHS PRN Lorretta Harp, MD      . vitamin B-12 (CYANOCOBALAMIN) tablet 100 mcg  100 mcg Oral Daily Amin, Ankit Chirag, MD   100 mcg at 12/09/18 1007  . zolpidem (AMBIEN) tablet 5 mg  5 mg Oral QHS PRN Lorretta Harp, MD   5 mg at 12/06/18 2149     Discharge Medications: Please see discharge summary for a list of discharge medications.  Relevant Imaging Results:  Relevant Lab Results:   Additional Information SS#: [id number]  Baldemar Lenis, LCSW

## 2018-12-09 NOTE — Progress Notes (Signed)
Pt c/o of chest pain and SOB. New vitals taken and O2 applied. Dr. Clearence Ped notified.

## 2018-12-10 DIAGNOSIS — I10 Essential (primary) hypertension: Secondary | ICD-10-CM | POA: Diagnosis not present

## 2018-12-10 DIAGNOSIS — D649 Anemia, unspecified: Secondary | ICD-10-CM | POA: Diagnosis not present

## 2018-12-10 DIAGNOSIS — E785 Hyperlipidemia, unspecified: Secondary | ICD-10-CM | POA: Diagnosis not present

## 2018-12-10 DIAGNOSIS — I639 Cerebral infarction, unspecified: Secondary | ICD-10-CM | POA: Diagnosis not present

## 2018-12-10 DIAGNOSIS — M2550 Pain in unspecified joint: Secondary | ICD-10-CM | POA: Diagnosis not present

## 2018-12-10 DIAGNOSIS — F329 Major depressive disorder, single episode, unspecified: Secondary | ICD-10-CM | POA: Diagnosis not present

## 2018-12-10 DIAGNOSIS — F419 Anxiety disorder, unspecified: Secondary | ICD-10-CM | POA: Diagnosis not present

## 2018-12-10 DIAGNOSIS — I119 Hypertensive heart disease without heart failure: Secondary | ICD-10-CM | POA: Diagnosis not present

## 2018-12-10 DIAGNOSIS — Z8673 Personal history of transient ischemic attack (TIA), and cerebral infarction without residual deficits: Secondary | ICD-10-CM | POA: Diagnosis not present

## 2018-12-10 DIAGNOSIS — J449 Chronic obstructive pulmonary disease, unspecified: Secondary | ICD-10-CM | POA: Diagnosis not present

## 2018-12-10 DIAGNOSIS — K219 Gastro-esophageal reflux disease without esophagitis: Secondary | ICD-10-CM

## 2018-12-10 DIAGNOSIS — E876 Hypokalemia: Secondary | ICD-10-CM | POA: Diagnosis not present

## 2018-12-10 DIAGNOSIS — Z7401 Bed confinement status: Secondary | ICD-10-CM | POA: Diagnosis not present

## 2018-12-10 DIAGNOSIS — I2510 Atherosclerotic heart disease of native coronary artery without angina pectoris: Secondary | ICD-10-CM | POA: Diagnosis not present

## 2018-12-10 DIAGNOSIS — I160 Hypertensive urgency: Secondary | ICD-10-CM

## 2018-12-10 DIAGNOSIS — F0280 Dementia in other diseases classified elsewhere without behavioral disturbance: Secondary | ICD-10-CM | POA: Diagnosis not present

## 2018-12-10 DIAGNOSIS — G9340 Encephalopathy, unspecified: Secondary | ICD-10-CM | POA: Diagnosis not present

## 2018-12-10 DIAGNOSIS — R262 Difficulty in walking, not elsewhere classified: Secondary | ICD-10-CM | POA: Diagnosis not present

## 2018-12-10 LAB — BASIC METABOLIC PANEL
Anion gap: 10 (ref 5–15)
BUN: 17 mg/dL (ref 8–23)
CHLORIDE: 98 mmol/L (ref 98–111)
CO2: 33 mmol/L — AB (ref 22–32)
CREATININE: 0.8 mg/dL (ref 0.44–1.00)
Calcium: 8.6 mg/dL — ABNORMAL LOW (ref 8.9–10.3)
GFR calc Af Amer: >60 mL/min (ref >60)
GFR calc non Af Amer: >60 mL/min (ref >60)
Glucose, Bld: 100 mg/dL — ABNORMAL HIGH (ref 70–99)
Potassium: 3.2 mmol/L — ABNORMAL LOW (ref 3.5–5.1)
Sodium: 141 mmol/L (ref 135–145)

## 2018-12-10 LAB — MAGNESIUM: Magnesium: 1.9 mg/dL (ref 1.7–2.4)

## 2018-12-10 MED ORDER — POTASSIUM CHLORIDE CRYS ER 20 MEQ PO TBCR: 40.0000 meq | EXTENDED_RELEASE_TABLET | Freq: Every day | ORAL | 0 refills | Status: AC

## 2018-12-10 MED ORDER — POTASSIUM CHLORIDE CRYS ER 20 MEQ PO TBCR
40.0000 meq | EXTENDED_RELEASE_TABLET | Freq: Once | ORAL | Status: DC
  Filled 2018-12-10: qty 2

## 2018-12-10 MED ORDER — CYANOCOBALAMIN 100 MCG PO TABS: 100.0000 ug | ORAL_TABLET | Freq: Every day | ORAL | Status: AC

## 2018-12-10 NOTE — Progress Notes (Signed)
Pt given discharge instructions, prescriptions, and care notes. Pt verbalized understanding AEB no further questions or concerns at this time. Skin dry and intact. Pt left the floor via PTAR in stable condition.

## 2018-12-10 NOTE — Discharge Summary (Signed)
PATIENT DETAILS Name: Heidi Jefferson Age: 85 y.o. Sex: female Date of Birth: 12/06/1923 MRN: [id number]. Admitting Physician: Heidi HarpXilin Niu, MD NWG:NFAOZHPCP:Penner, Rinaldo CloudPamela, MD  Admit Date: 12/05/2018 Discharge date: 12/10/2018  Recommendations for Outpatient Follow-up:  1. Follow up with PCP in 1-2 weeks 2. Please obtain BMP/CBC in one week  Admitted From:  Home  Disposition: SNF   Home Health: No  Equipment/Devices: None  Discharge Condition: Stable  CODE STATUS: FULL CODE  Diet recommendation:  Heart Healthy   Brief Summary: See H&P, Labs, Consult and Test reports for all details in brief, patient is a 85 year old female with history of hypertension, dyslipidemia, COPD who presented to the hospital for worsening confusion, she was found to have uncontrolled hypertension.  See below for further details  Brief Hospital Course: Acute encephalopathy: Likely secondary to uncontrolled hypertension-encephalopathy/confusion resolved after better control of her blood pressure.  MRI brain did not show any acute abnormalities.    Hypertension: Initially uncontrolled-apparently patient was having issues in getting her medications-after her usual medications were restarted-blood pressure has been stable.  As noted above-confusion resolved after adequate blood pressure control.  Dyslipidemia: Continue Lipitor  GERD: Continue Pepcid  CAD: No chest pain this morning-seems stable-continue aspirin, Lipitor and metoprolol.  Hypokalemia: Likely secondary to diuretics-we will place on scheduled potassium supplementation  Procedures/Studies: None  Discharge Diagnoses:  Principal Problem:   Hypertensive urgency Active Problems:   HLD (hyperlipidemia)   GERD (gastroesophageal reflux disease)   CAD (coronary artery disease)   History of stroke   COPD (chronic obstructive pulmonary disease) (HCC)   Hypokalemia   Discharge Instructions:  Activity:  As tolerated  Discharge  Instructions    Diet - low sodium heart healthy   Complete by:  As directed    Discharge instructions   Complete by:  As directed    Follow with Primary MD  Alinda DeemPenner, [name], MD in 1 week  Please get a complete blood count and chemistry panel checked by your Primary MD at your next visit, and again as instructed by your Primary MD.  Get Medicines reviewed and adjusted: Please take all your medications with you for your next visit with your Primary MD  Laboratory/radiological data: Please request your Primary MD to go over all hospital tests and procedure/radiological results at the follow up, please ask your Primary MD to get all Hospital records sent to his/her office.  In some cases, they will be blood work, cultures and biopsy results pending at the time of your discharge. Please request that your primary care M.D. follows up on these results.  Also Note the following: If you experience worsening of your admission symptoms, develop shortness of breath, life threatening emergency, suicidal or homicidal thoughts you must seek medical attention immediately by calling 911 or calling your MD immediately  if symptoms less severe.  You must read complete instructions/literature along with all the possible adverse reactions/side effects for all the Medicines you take and that have been prescribed to you. Take any new Medicines after you have completely understood and accpet all the possible adverse reactions/side effects.   Do not drive when taking Pain medications or sleeping medications (Benzodaizepines)  Do not take more than prescribed Pain, Sleep and Anxiety Medications. It is not advisable to combine anxiety,sleep and pain medications without talking with your primary care practitioner  Special Instructions: If you have smoked or chewed Tobacco  in the last 2 yrs please stop smoking, stop any regular Alcohol  and or any Recreational  drug use.  Wear Seat belts while driving.  Please  note: You were cared for by a hospitalist during your hospital stay. Once you are discharged, your primary care physician will handle any further medical issues. Please note that NO REFILLS for any discharge medications will be authorized once you are discharged, as it is imperative that you return to your primary care physician (or establish a relationship with a primary care physician if you do not have one) for your post hospital discharge needs so that they can reassess your need for medications and monitor your lab values.   Increase activity slowly   Complete by:  As directed      Allergies as of 12/10/2018      Reactions   Mustard Jonelle Sports Isothiocyanate] Hives   Zestril [lisinopril] Cough   Zithromax [azithromycin] Other (See Comments)   dizziness   Codeine Nausea And Vomiting, Rash   Novocain [procaine] Rash      Medication List    TAKE these medications   amitriptyline 10 MG tablet Commonly known as:  ELAVIL Take 10 mg by mouth at bedtime.   amLODipine 10 MG tablet Commonly known as:  NORVASC Take 10 mg by mouth daily.   aspirin 81 MG tablet Take 81 mg by mouth daily.   atorvastatin 20 MG tablet Commonly known as:  LIPITOR Take 20 mg by mouth every evening.   CALTRATE 600+D 600-800 MG-UNIT Tabs Generic drug:  Calcium Carb-Cholecalciferol Take 1 tablet by mouth daily.   cholecalciferol 25 MCG (1000 UT) tablet Commonly known as:  VITAMIN D Take 1,000 Units by mouth daily.   cyanocobalamin 100 MCG tablet Take 1 tablet (100 mcg total) by mouth daily. Start taking on:  December 11, 2018   fluticasone 50 MCG/ACT nasal spray Commonly known as:  FLONASE Place 1-2 sprays into both nostrils daily.   furosemide 40 MG tablet Commonly known as:  LASIX Take 40 mg by mouth daily.   ibuprofen 600 MG tablet Commonly known as:  ADVIL,MOTRIN Take 600 mg by mouth every 6 (six) hours as needed for moderate pain.   lubiprostone 8 MCG capsule Commonly known as:  AMITIZA Take  8 mcg by mouth daily.   metoprolol tartrate 25 MG tablet Commonly known as:  LOPRESSOR Take 25 mg by mouth 2 (two) times daily.   montelukast 10 MG tablet Commonly known as:  SINGULAIR Take 10 mg by mouth at bedtime.   potassium chloride SA 20 MEQ tablet Commonly known as:  K-DUR,KLOR-CON Take 2 tablets (40 mEq total) by mouth daily. Start taking on:  December 11, 2018   ranitidine 300 MG tablet Commonly known as:  ZANTAC Take 300 mg by mouth at bedtime.   valsartan 320 MG tablet Commonly known as:  DIOVAN Take 320 mg by mouth daily.      Follow-up Information    Alinda Deem, MD. Schedule an appointment as soon as possible for a visit in 1 week(s).   Specialty:  Family Medicine Contact information: [address] [place] [address] [phone]          Allergies  Allergen Reactions  . Arlice Colt Isothiocyanate] Hives  . Zestril [Lisinopril] Cough  . Zithromax [Azithromycin] Other (See Comments)    dizziness  . Codeine Nausea And Vomiting and Rash  . Novocain [Procaine] Rash    Consultations:   None  Other Procedures/Studies: Dg Chest 2 View  Result Date: 12/05/2018 CLINICAL DATA:  Dyspnea. EXAM: CHEST - 2 VIEW COMPARISON:  Radiographs of November 01, 2018. FINDINGS: Stable cardiomegaly. No pneumothorax is noted. Right lung is clear. Mild left basilar atelectasis is noted with small pleural effusion. Bony thorax is unremarkable. IMPRESSION: Mild left basilar atelectasis with small left pleural effusion. Electronically Signed   By: Lupita Raider, M.D.   On: 12/05/2018 18:56   Mr Laqueta Jean ZO Contrast  Result Date: 12/07/2018 CLINICAL DATA:  85 year old female with hypertension, 202 systolic. Mild headache and chest pain. EXAM: MRI HEAD WITHOUT AND WITH CONTRAST TECHNIQUE: Multiplanar, multiecho pulse sequences of the brain and surrounding structures were obtained without and with intravenous contrast. CONTRAST:  7.5 milliliters  Gadavist COMPARISON:  Head CT without contrast 08/13/2018 and earlier. Brain MRI 06/01/2008. FINDINGS: Brain: No restricted diffusion to suggest acute infarction. No midline shift, mass effect, evidence of mass lesion, ventriculomegaly, extra-axial collection or acute intracranial hemorrhage. Cervicomedullary junction and pituitary are within normal limits. Generalized cerebral volume loss since 2009. Mild for age scattered nonspecific cerebral white matter T2 and FLAIR hyperintensity. Similar mild T2 heterogeneity in the pons and bilateral deep gray matter nuclei. There are occasional chronic cerebral blood products including 2 micro hemorrhages (left anterior frontal lobe series 12, image 29) and a small area of superficial siderosis in the left superior frontal gyrus (image 37). No cortical encephalomalacia. No abnormal enhancement identified. No dural thickening Vascular: Major intracranial vascular flow voids are stable since 2009, the left vertebral artery is dominant. The major dural venous sinuses are enhancing and appear to be patent. Skull and upper cervical spine: Negative visible cervical spine. Normal bone marrow signal. Sinuses/Orbits: Stable and negative. Other: Mastoids remain clear. Visible internal auditory structures appear normal. Scalp and face soft tissues appear negative. IMPRESSION: 1.  No acute intracranial abnormality. 2. Occasional chronic cerebral blood products, including two hemispheric chronic micro-hemorrhages and a small area of left superior frontal gyrus superficial siderosis. Otherwise mild for age signal changes in the brain which are nonspecific but most commonly due to chronic small vessel disease. Electronically Signed   By: Odessa Fleming M.D.   On: 12/07/2018 11:52      TODAY-DAY OF DISCHARGE:  Subjective:   [place] today has no headache,no chest abdominal pain,no new weakness tingling or numbness, feels much better wants to go home today.   Objective:   Blood  pressure (!) 141/55, pulse 79, temperature 97.9 F (36.6 C), temperature source Oral, resp. rate 18, height 5\' 1"  (1.549 m), weight 67.9 kg, SpO2 93 %.  Intake/Output Summary (Last 24 hours) at 12/10/2018 1016 Last data filed at 12/09/2018 2100 Gross per 24 hour  Intake 120 ml  Output 1 ml  Net 119 ml   Filed Weights   12/05/18 1721 12/08/18 1700  Weight: 74 kg 67.9 kg    Exam: Awake Alert, Oriented *3, No new F.N deficits, Normal affect [place].AT,PERRAL Supple Neck,No JVD, No cervical lymphadenopathy appriciated.  Symmetrical Chest wall movement, Good air movement bilaterally, CTAB RRR,No Gallops,Rubs or new Murmurs, No Parasternal Heave +ve B.Sounds, Abd Soft, Non tender, No organomegaly appriciated, No rebound -guarding or rigidity. No Cyanosis, Clubbing or edema, No new Rash or bruise   PERTINENT RADIOLOGIC STUDIES: Dg Chest 2 View  Result Date: 12/05/2018 CLINICAL DATA:  Dyspnea. EXAM: CHEST - 2 VIEW COMPARISON:  Radiographs of November 01, 2018. FINDINGS: Stable cardiomegaly. No pneumothorax is noted. Right lung is clear. Mild left basilar atelectasis is noted with small pleural effusion. Bony thorax is unremarkable. IMPRESSION: Mild left basilar atelectasis with small left pleural effusion.  Electronically Signed   By: Lupita Raider, M.D.   On: 12/05/2018 18:56   Mr Laqueta Jean WU Contrast  Result Date: 12/07/2018 CLINICAL DATA:  85 year old female with hypertension, 202 systolic. Mild headache and chest pain. EXAM: MRI HEAD WITHOUT AND WITH CONTRAST TECHNIQUE: Multiplanar, multiecho pulse sequences of the brain and surrounding structures were obtained without and with intravenous contrast. CONTRAST:  7.5 milliliters Gadavist COMPARISON:  Head CT without contrast 08/13/2018 and earlier. Brain MRI 06/01/2008. FINDINGS: Brain: No restricted diffusion to suggest acute infarction. No midline shift, mass effect, evidence of mass lesion, ventriculomegaly, extra-axial collection or acute  intracranial hemorrhage. Cervicomedullary junction and pituitary are within normal limits. Generalized cerebral volume loss since 2009. Mild for age scattered nonspecific cerebral white matter T2 and FLAIR hyperintensity. Similar mild T2 heterogeneity in the pons and bilateral deep gray matter nuclei. There are occasional chronic cerebral blood products including 2 micro hemorrhages (left anterior frontal lobe series 12, image 29) and a small area of superficial siderosis in the left superior frontal gyrus (image 37). No cortical encephalomalacia. No abnormal enhancement identified. No dural thickening Vascular: Major intracranial vascular flow voids are stable since 2009, the left vertebral artery is dominant. The major dural venous sinuses are enhancing and appear to be patent. Skull and upper cervical spine: Negative visible cervical spine. Normal bone marrow signal. Sinuses/Orbits: Stable and negative. Other: Mastoids remain clear. Visible internal auditory structures appear normal. Scalp and face soft tissues appear negative. IMPRESSION: 1.  No acute intracranial abnormality. 2. Occasional chronic cerebral blood products, including two hemispheric chronic micro-hemorrhages and a small area of left superior frontal gyrus superficial siderosis. Otherwise mild for age signal changes in the brain which are nonspecific but most commonly due to chronic small vessel disease. Electronically Signed   By: Odessa Fleming M.D.   On: 12/07/2018 11:52     PERTINENT LAB RESULTS: CBC: Recent Labs    12/08/18 0405 12/08/18 0929  WBC 4.5  --   HGB 10.9*  --   HCT 35.8* 32.6*  PLT 273  --    CMET CMP     Component Value Date/Time   NA 141 12/10/2018 0425   K 3.2 (L) 12/10/2018 0425   CL 98 12/10/2018 0425   CO2 33 (H) 12/10/2018 0425   GLUCOSE 100 (H) 12/10/2018 0425   BUN 17 12/10/2018 0425   CREATININE 0.80 12/10/2018 0425   CALCIUM 8.6 (L) 12/10/2018 0425   GFRNONAA >60 12/10/2018 0425   GFRAA >60  12/10/2018 0425    GFR Estimated Creatinine Clearance: 38.7 mL/min (by C-G formula based on SCr of 0.8 mg/dL). No results for input(s): LIPASE, AMYLASE in the last 72 hours. Recent Labs    12/09/18 0416  TROPONINI <0.03   Invalid input(s): POCBNP No results for input(s): DDIMER in the last 72 hours. No results for input(s): HGBA1C in the last 72 hours. No results for input(s): CHOL, HDL, LDLCALC, TRIG, CHOLHDL, LDLDIRECT in the last 72 hours. Recent Labs    12/08/18 0929  TSH 2.443   Recent Labs    12/08/18 0929  VITAMINB12 192   Coags: No results for input(s): INR in the last 72 hours.  Invalid input(s): PT Microbiology: No results found for this or any previous visit (from the past 240 hour(s)).  FURTHER DISCHARGE INSTRUCTIONS:  Get Medicines reviewed and adjusted: Please take all your medications with you for your next visit with your Primary MD  Laboratory/radiological data: Please request your Primary MD to go  over all hospital tests and procedure/radiological results at the follow up, please ask your Primary MD to get all Hospital records sent to his/her office.  In some cases, they will be blood work, cultures and biopsy results pending at the time of your discharge. Please request that your primary care M.D. goes through all the records of your hospital data and follows up on these results.  Also Note the following: If you experience worsening of your admission symptoms, develop shortness of breath, life threatening emergency, suicidal or homicidal thoughts you must seek medical attention immediately by calling 911 or calling your MD immediately  if symptoms less severe.  You must read complete instructions/literature along with all the possible adverse reactions/side effects for all the Medicines you take and that have been prescribed to you. Take any new Medicines after you have completely understood and accpet all the possible adverse reactions/side effects.    Do not drive when taking Pain medications or sleeping medications (Benzodaizepines)  Do not take more than prescribed Pain, Sleep and Anxiety Medications. It is not advisable to combine anxiety,sleep and pain medications without talking with your primary care practitioner  Special Instructions: If you have smoked or chewed Tobacco  in the last 2 yrs please stop smoking, stop any regular Alcohol  and or any Recreational drug use.  Wear Seat belts while driving.  Please note: You were cared for by a hospitalist during your hospital stay. Once you are discharged, your primary care physician will handle any further medical issues. Please note that NO REFILLS for any discharge medications will be authorized once you are discharged, as it is imperative that you return to your primary care physician (or establish a relationship with a primary care physician if you do not have one) for your post hospital discharge needs so that they can reassess your need for medications and monitor your lab values.  Total Time spent coordinating discharge including counseling, education and face to face time equals  45 minutes.  SignedJeoffrey Massed: Shanker Ghimire 12/10/2018 10:16 AM

## 2018-12-10 NOTE — Clinical Social Work Placement (Signed)
   CLINICAL SOCIAL WORK PLACEMENT  NOTE  Date:  12/10/2018  Patient Details  Name: Heidi Jefferson MRN: [id number] Date of Birth: 07/06/1923  Clinical Social Work is seeking post-discharge placement for this patient at the Skilled  Nursing Facility level of care (*CSW will initial, date and re-position this form in  chart as items are completed):  Yes   Patient/family provided with [facility] Clinical Social Work Department's list of facilities offering this level of care within the geographic area requested by the patient (or if unable, by the patient's family).  Yes   Patient/family informed of their freedom to choose among providers that offer the needed level of care, that participate in Medicare, Medicaid or managed care program needed by the patient, have an available bed and are willing to accept the patient.  Yes   Patient/family informed of 's ownership interest in [facility] and [facility], as well as of the fact that they are under no obligation to receive care at these facilities.  PASRR submitted to EDS on       PASRR number received on       Existing PASRR number confirmed on 12/10/18     FL2 transmitted to all facilities in geographic area requested by pt/family on 12/10/18     FL2 transmitted to all facilities within larger geographic area on       Patient informed that his/her managed care company has contracts with or will negotiate with certain facilities, including the following:        Yes   Patient/family informed of bed offers received.  Patient chooses bed at [facility]     Physician recommends and patient chooses bed at      Patient to be transferred to [facility], [place] on 12/10/18.  Patient to be transferred to facility by [facility]     Patient family notified on 12/10/18 of transfer.  Name of family member notified:  Daughter     PHYSICIAN       Additional Comment:     _______________________________________________ Mearl LatinNadia S Dearra Myhand, LCSW 12/10/2018, 10:26 AM

## 2018-12-10 NOTE — Progress Notes (Signed)
Patient will DC to: [facility] [place] Anticipated DC date: 12/10/17 Family notified: Daughter Transport by: Sharin Mons   Per MD patient ready for DC to [facility]. RN, patient, patient's family, and facility notified of DC. Discharge Summary and FL2 sent to facility. RN to call report prior to discharge [phone] Room 603). DC packet on chart. Ambulance transport requested for patient.   CSW will sign off for now as social work intervention is no longer needed. Please consult [place] again if new needs arise.  Cristobal Goldmann, LCSW Clinical Social Worker [phone]

## 2018-12-15 DIAGNOSIS — I639 Cerebral infarction, unspecified: Secondary | ICD-10-CM | POA: Diagnosis not present

## 2018-12-15 DIAGNOSIS — R262 Difficulty in walking, not elsewhere classified: Secondary | ICD-10-CM | POA: Diagnosis not present

## 2018-12-15 DIAGNOSIS — D649 Anemia, unspecified: Secondary | ICD-10-CM | POA: Diagnosis not present

## 2018-12-15 DIAGNOSIS — I119 Hypertensive heart disease without heart failure: Secondary | ICD-10-CM | POA: Diagnosis not present

## 2018-12-20 ENCOUNTER — Other Ambulatory Visit: Payer: Self-pay | Admitting: *Deleted

## 2018-12-20 NOTE — Patient Outreach (Signed)
[facility]) Care Management  12/20/2018  Heidi Jefferson March 12, 1923 [id number]   Facility site visit at [facility] and spoke with [facility] discharge planner  to discuss patient's progress and plan for transitioning to home.  Heidi Jefferson states patient has some cognitive deficits and is cared for by her daughter who lives in the home.  Heidi Jefferson stated neither patient nor daughter drive so patient has missed appointments and medications related to a transportation issues.  Heidi Jefferson states the discharge plan is for patient to return home with the daughter and home health on Friday 12/24/18. Heidi Jefferson felt [facility] Care Management services would be very helpful for this patient.   Went to patient's bedside.  Patient was ambulating in room with a walker.  Patient stated she was really looking forward to going home and was already packing.  Patient stated she did not drive but her daughter would borrow the neighbor's care and drive to get groceries and medications if the car was available. Patient stated she was unsure who took care of her medications because she could not remember.  Patient stated she thinks she was in the hospital because of her heart issues.  Explained [facility] Customer [facility] Care Management services and gave patient a [facility] packet with contact information included.  Patient agreed to[facility] CM Services and gave verbal permission for [facility] to contact her daughter Heidi Jefferson at [phone].   Referral sent for [facility] RN community care manager, [facility] LCSW and [facility] Pharmacist.   Plan to make [facility] UM Team Member aware of [facility] CM referral.   Heidi Claxton RN, BSN [facility]  Post Acute Care Coordinator [phone]) Business Mobile [phone]) Toll free office

## 2018-12-22 ENCOUNTER — Other Ambulatory Visit: Payer: Self-pay

## 2018-12-22 NOTE — Patient Outreach (Signed)
[facility]) Care Management  12/22/2018  Heidi Jefferson 1923-07-02 [id number]  Successful outreach to the patients daughter [name] who is the primary contact as indicated in referral. Patient HIPAA identifiers obtained. BSW was provided a new address for the patient which is now correct in EMR. BSW introduced self to the patient and the reason for today's call indicating the patient had been referred to this BSW for transportation and caregiver resources. [name] reports she did not know I would be calling as she only thought "a nurse was going to come to our home". [name] states the patient has a PCS caregiver 5 days per week for 1.5-2 hours each day. She reports this caregiver assists with transportation needs. [name] further reports the patient is active with [facility] to assist with transportation when the aide is unavailable.  BSW will close patient to social work as the daughter denies other social work needs. BSW explained that a [facility] RNCM would re-refer the patient to social work if other needs are identified. [name] stated understanding and is in agreement with this. BSW will also send an in-basket message to assigned RNCM and [facility] pharmacist as the patient is reported to have returned home on 1/14 rather than the planned discharge date of 1/17.  Bevelyn Ngo, BSW, CDP [facility] [phone]

## 2018-12-22 NOTE — Patient Outreach (Signed)
Transition of care/ case closure:  Placed call to patient and spoke with daughter who is was contact person. Daughter reports to me that her mother is doing well. Reports she has all her mothers medications and that her mother is taking them as prescribed.  Reports patients primary MD is Dr. Darleene Cleaver from [facility] and that he does house calls. Daughter denies that patient has seen Dr. Belva Crome in a long time.   PLAN: Placed call to Janci Minor to inquire about primary MD. Placed call to "[facility]" and patient is not active with them. Dr. Jacqulynn Cadet is not with that practice. Placed call to Tomasita Crumble, [facility] assistant who states that this MD is not [facility] according to the directory.  Will plan to close case as patient is not eligible for services. I have sent an in basket message to team mates to inform. Placed call to daughter to inform patient is not eligible.   Rowe Pavy, RN, BSN, CEN [facility] [phone]

## 2018-12-23 DIAGNOSIS — I10 Essential (primary) hypertension: Secondary | ICD-10-CM | POA: Diagnosis not present

## 2018-12-23 DIAGNOSIS — Z7902 Long term (current) use of antithrombotics/antiplatelets: Secondary | ICD-10-CM | POA: Diagnosis not present

## 2018-12-23 DIAGNOSIS — R600 Localized edema: Secondary | ICD-10-CM | POA: Diagnosis not present

## 2018-12-23 DIAGNOSIS — I110 Hypertensive heart disease with heart failure: Secondary | ICD-10-CM | POA: Diagnosis not present

## 2018-12-23 DIAGNOSIS — Z86711 Personal history of pulmonary embolism: Secondary | ICD-10-CM | POA: Diagnosis not present

## 2018-12-23 DIAGNOSIS — M810 Age-related osteoporosis without current pathological fracture: Secondary | ICD-10-CM | POA: Diagnosis not present

## 2018-12-23 DIAGNOSIS — E669 Obesity, unspecified: Secondary | ICD-10-CM | POA: Diagnosis not present

## 2018-12-23 DIAGNOSIS — Z9981 Dependence on supplemental oxygen: Secondary | ICD-10-CM | POA: Diagnosis not present

## 2018-12-23 DIAGNOSIS — J9611 Chronic respiratory failure with hypoxia: Secondary | ICD-10-CM | POA: Diagnosis not present

## 2018-12-23 DIAGNOSIS — Z86718 Personal history of other venous thrombosis and embolism: Secondary | ICD-10-CM | POA: Diagnosis not present

## 2018-12-23 DIAGNOSIS — I2510 Atherosclerotic heart disease of native coronary artery without angina pectoris: Secondary | ICD-10-CM | POA: Diagnosis not present

## 2018-12-23 DIAGNOSIS — Z8673 Personal history of transient ischemic attack (TIA), and cerebral infarction without residual deficits: Secondary | ICD-10-CM | POA: Diagnosis not present

## 2018-12-23 DIAGNOSIS — Z7982 Long term (current) use of aspirin: Secondary | ICD-10-CM | POA: Diagnosis not present

## 2018-12-23 DIAGNOSIS — M1991 Primary osteoarthritis, unspecified site: Secondary | ICD-10-CM | POA: Diagnosis not present

## 2018-12-23 DIAGNOSIS — E876 Hypokalemia: Secondary | ICD-10-CM | POA: Diagnosis not present

## 2018-12-23 DIAGNOSIS — J449 Chronic obstructive pulmonary disease, unspecified: Secondary | ICD-10-CM | POA: Diagnosis not present

## 2018-12-23 DIAGNOSIS — E782 Mixed hyperlipidemia: Secondary | ICD-10-CM | POA: Diagnosis not present

## 2018-12-23 DIAGNOSIS — G4733 Obstructive sleep apnea (adult) (pediatric): Secondary | ICD-10-CM | POA: Diagnosis not present

## 2018-12-23 DIAGNOSIS — I480 Paroxysmal atrial fibrillation: Secondary | ICD-10-CM | POA: Diagnosis not present

## 2018-12-23 DIAGNOSIS — K219 Gastro-esophageal reflux disease without esophagitis: Secondary | ICD-10-CM | POA: Diagnosis not present

## 2018-12-23 DIAGNOSIS — I5032 Chronic diastolic (congestive) heart failure: Secondary | ICD-10-CM | POA: Diagnosis not present

## 2018-12-23 DIAGNOSIS — Z6828 Body mass index (BMI) 28.0-28.9, adult: Secondary | ICD-10-CM | POA: Diagnosis not present

## 2018-12-23 DIAGNOSIS — E785 Hyperlipidemia, unspecified: Secondary | ICD-10-CM | POA: Diagnosis not present

## 2018-12-27 ENCOUNTER — Ambulatory Visit: Payer: Self-pay

## 2018-12-27 DIAGNOSIS — I110 Hypertensive heart disease with heart failure: Secondary | ICD-10-CM | POA: Diagnosis not present

## 2018-12-27 DIAGNOSIS — J449 Chronic obstructive pulmonary disease, unspecified: Secondary | ICD-10-CM | POA: Diagnosis not present

## 2018-12-27 DIAGNOSIS — I2510 Atherosclerotic heart disease of native coronary artery without angina pectoris: Secondary | ICD-10-CM | POA: Diagnosis not present

## 2018-12-27 DIAGNOSIS — I5032 Chronic diastolic (congestive) heart failure: Secondary | ICD-10-CM | POA: Diagnosis not present

## 2018-12-27 DIAGNOSIS — J9611 Chronic respiratory failure with hypoxia: Secondary | ICD-10-CM | POA: Diagnosis not present

## 2018-12-27 DIAGNOSIS — I480 Paroxysmal atrial fibrillation: Secondary | ICD-10-CM | POA: Diagnosis not present

## 2018-12-28 DIAGNOSIS — I480 Paroxysmal atrial fibrillation: Secondary | ICD-10-CM | POA: Diagnosis not present

## 2018-12-28 DIAGNOSIS — I2510 Atherosclerotic heart disease of native coronary artery without angina pectoris: Secondary | ICD-10-CM | POA: Diagnosis not present

## 2018-12-28 DIAGNOSIS — I110 Hypertensive heart disease with heart failure: Secondary | ICD-10-CM | POA: Diagnosis not present

## 2018-12-28 DIAGNOSIS — I5032 Chronic diastolic (congestive) heart failure: Secondary | ICD-10-CM | POA: Diagnosis not present

## 2018-12-28 DIAGNOSIS — J9611 Chronic respiratory failure with hypoxia: Secondary | ICD-10-CM | POA: Diagnosis not present

## 2018-12-28 DIAGNOSIS — J449 Chronic obstructive pulmonary disease, unspecified: Secondary | ICD-10-CM | POA: Diagnosis not present

## 2018-12-30 DIAGNOSIS — I2510 Atherosclerotic heart disease of native coronary artery without angina pectoris: Secondary | ICD-10-CM | POA: Diagnosis not present

## 2018-12-30 DIAGNOSIS — J9611 Chronic respiratory failure with hypoxia: Secondary | ICD-10-CM | POA: Diagnosis not present

## 2018-12-30 DIAGNOSIS — I480 Paroxysmal atrial fibrillation: Secondary | ICD-10-CM | POA: Diagnosis not present

## 2018-12-30 DIAGNOSIS — I5032 Chronic diastolic (congestive) heart failure: Secondary | ICD-10-CM | POA: Diagnosis not present

## 2018-12-30 DIAGNOSIS — J449 Chronic obstructive pulmonary disease, unspecified: Secondary | ICD-10-CM | POA: Diagnosis not present

## 2018-12-30 DIAGNOSIS — I110 Hypertensive heart disease with heart failure: Secondary | ICD-10-CM | POA: Diagnosis not present

## 2019-01-01 DIAGNOSIS — J449 Chronic obstructive pulmonary disease, unspecified: Secondary | ICD-10-CM | POA: Diagnosis not present

## 2019-01-01 DIAGNOSIS — E785 Hyperlipidemia, unspecified: Secondary | ICD-10-CM

## 2019-01-01 DIAGNOSIS — I10 Essential (primary) hypertension: Secondary | ICD-10-CM | POA: Diagnosis not present

## 2019-01-01 DIAGNOSIS — E669 Obesity, unspecified: Secondary | ICD-10-CM | POA: Diagnosis not present

## 2019-01-01 DIAGNOSIS — I350 Nonrheumatic aortic (valve) stenosis: Secondary | ICD-10-CM | POA: Diagnosis not present

## 2019-01-01 DIAGNOSIS — R531 Weakness: Secondary | ICD-10-CM | POA: Diagnosis not present

## 2019-01-01 DIAGNOSIS — R42 Dizziness and giddiness: Secondary | ICD-10-CM | POA: Diagnosis not present

## 2019-01-01 DIAGNOSIS — Z79899 Other long term (current) drug therapy: Secondary | ICD-10-CM | POA: Diagnosis not present

## 2019-01-01 DIAGNOSIS — I4891 Unspecified atrial fibrillation: Secondary | ICD-10-CM | POA: Diagnosis not present

## 2019-01-01 DIAGNOSIS — E119 Type 2 diabetes mellitus without complications: Secondary | ICD-10-CM

## 2019-01-01 DIAGNOSIS — N179 Acute kidney failure, unspecified: Secondary | ICD-10-CM | POA: Diagnosis not present

## 2019-01-01 DIAGNOSIS — E860 Dehydration: Secondary | ICD-10-CM | POA: Diagnosis not present

## 2019-01-01 DIAGNOSIS — E875 Hyperkalemia: Secondary | ICD-10-CM | POA: Diagnosis not present

## 2019-01-03 DIAGNOSIS — E785 Hyperlipidemia, unspecified: Secondary | ICD-10-CM | POA: Diagnosis not present

## 2019-01-03 DIAGNOSIS — I350 Nonrheumatic aortic (valve) stenosis: Secondary | ICD-10-CM | POA: Diagnosis not present

## 2019-01-03 DIAGNOSIS — I10 Essential (primary) hypertension: Secondary | ICD-10-CM | POA: Diagnosis not present

## 2019-01-03 DIAGNOSIS — E119 Type 2 diabetes mellitus without complications: Secondary | ICD-10-CM | POA: Diagnosis not present

## 2019-01-03 DIAGNOSIS — E875 Hyperkalemia: Secondary | ICD-10-CM | POA: Diagnosis not present

## 2019-01-03 DIAGNOSIS — I4891 Unspecified atrial fibrillation: Secondary | ICD-10-CM | POA: Diagnosis not present

## 2019-01-03 DIAGNOSIS — R531 Weakness: Secondary | ICD-10-CM | POA: Diagnosis not present

## 2019-01-03 DIAGNOSIS — N179 Acute kidney failure, unspecified: Secondary | ICD-10-CM | POA: Diagnosis not present

## 2019-01-03 DIAGNOSIS — E860 Dehydration: Secondary | ICD-10-CM | POA: Diagnosis not present

## 2019-01-04 DIAGNOSIS — E875 Hyperkalemia: Secondary | ICD-10-CM | POA: Diagnosis not present

## 2019-01-04 DIAGNOSIS — I4891 Unspecified atrial fibrillation: Secondary | ICD-10-CM | POA: Diagnosis not present

## 2019-01-04 DIAGNOSIS — E860 Dehydration: Secondary | ICD-10-CM | POA: Diagnosis not present

## 2019-01-04 DIAGNOSIS — E785 Hyperlipidemia, unspecified: Secondary | ICD-10-CM | POA: Diagnosis not present

## 2019-01-04 DIAGNOSIS — I10 Essential (primary) hypertension: Secondary | ICD-10-CM | POA: Diagnosis not present

## 2019-01-04 DIAGNOSIS — N179 Acute kidney failure, unspecified: Secondary | ICD-10-CM | POA: Diagnosis not present

## 2019-01-04 DIAGNOSIS — R531 Weakness: Secondary | ICD-10-CM | POA: Diagnosis not present

## 2019-01-04 DIAGNOSIS — E119 Type 2 diabetes mellitus without complications: Secondary | ICD-10-CM | POA: Diagnosis not present

## 2019-01-04 DIAGNOSIS — I350 Nonrheumatic aortic (valve) stenosis: Secondary | ICD-10-CM | POA: Diagnosis not present

## 2019-01-05 DIAGNOSIS — I10 Essential (primary) hypertension: Secondary | ICD-10-CM | POA: Diagnosis not present

## 2019-01-05 DIAGNOSIS — E860 Dehydration: Secondary | ICD-10-CM | POA: Diagnosis not present

## 2019-01-05 DIAGNOSIS — I350 Nonrheumatic aortic (valve) stenosis: Secondary | ICD-10-CM | POA: Diagnosis not present

## 2019-01-05 DIAGNOSIS — E119 Type 2 diabetes mellitus without complications: Secondary | ICD-10-CM | POA: Diagnosis not present

## 2019-01-05 DIAGNOSIS — E785 Hyperlipidemia, unspecified: Secondary | ICD-10-CM | POA: Diagnosis not present

## 2019-01-05 DIAGNOSIS — E875 Hyperkalemia: Secondary | ICD-10-CM | POA: Diagnosis not present

## 2019-01-05 DIAGNOSIS — I4891 Unspecified atrial fibrillation: Secondary | ICD-10-CM | POA: Diagnosis not present

## 2019-01-05 DIAGNOSIS — R531 Weakness: Secondary | ICD-10-CM | POA: Diagnosis not present

## 2019-01-05 DIAGNOSIS — N179 Acute kidney failure, unspecified: Secondary | ICD-10-CM | POA: Diagnosis not present

## 2019-01-06 DIAGNOSIS — I350 Nonrheumatic aortic (valve) stenosis: Secondary | ICD-10-CM | POA: Diagnosis not present

## 2019-01-06 DIAGNOSIS — I4891 Unspecified atrial fibrillation: Secondary | ICD-10-CM | POA: Diagnosis not present

## 2019-01-06 DIAGNOSIS — I509 Heart failure, unspecified: Secondary | ICD-10-CM | POA: Diagnosis not present

## 2019-01-06 DIAGNOSIS — R269 Unspecified abnormalities of gait and mobility: Secondary | ICD-10-CM | POA: Diagnosis not present

## 2019-01-06 DIAGNOSIS — J449 Chronic obstructive pulmonary disease, unspecified: Secondary | ICD-10-CM | POA: Diagnosis not present

## 2019-01-06 DIAGNOSIS — E119 Type 2 diabetes mellitus without complications: Secondary | ICD-10-CM | POA: Diagnosis not present

## 2019-01-06 DIAGNOSIS — R600 Localized edema: Secondary | ICD-10-CM | POA: Diagnosis not present

## 2019-01-06 DIAGNOSIS — M6281 Muscle weakness (generalized): Secondary | ICD-10-CM | POA: Diagnosis not present

## 2019-01-07 DIAGNOSIS — I2510 Atherosclerotic heart disease of native coronary artery without angina pectoris: Secondary | ICD-10-CM | POA: Diagnosis not present

## 2019-01-07 DIAGNOSIS — I110 Hypertensive heart disease with heart failure: Secondary | ICD-10-CM | POA: Diagnosis not present

## 2019-01-07 DIAGNOSIS — J449 Chronic obstructive pulmonary disease, unspecified: Secondary | ICD-10-CM | POA: Diagnosis not present

## 2019-01-07 DIAGNOSIS — I5032 Chronic diastolic (congestive) heart failure: Secondary | ICD-10-CM | POA: Diagnosis not present

## 2019-01-07 DIAGNOSIS — I480 Paroxysmal atrial fibrillation: Secondary | ICD-10-CM | POA: Diagnosis not present

## 2019-01-07 DIAGNOSIS — J9611 Chronic respiratory failure with hypoxia: Secondary | ICD-10-CM | POA: Diagnosis not present

## 2019-01-11 DIAGNOSIS — I2510 Atherosclerotic heart disease of native coronary artery without angina pectoris: Secondary | ICD-10-CM | POA: Diagnosis not present

## 2019-01-11 DIAGNOSIS — I5032 Chronic diastolic (congestive) heart failure: Secondary | ICD-10-CM | POA: Diagnosis not present

## 2019-01-11 DIAGNOSIS — J9611 Chronic respiratory failure with hypoxia: Secondary | ICD-10-CM | POA: Diagnosis not present

## 2019-01-11 DIAGNOSIS — I110 Hypertensive heart disease with heart failure: Secondary | ICD-10-CM | POA: Diagnosis not present

## 2019-01-11 DIAGNOSIS — J449 Chronic obstructive pulmonary disease, unspecified: Secondary | ICD-10-CM | POA: Diagnosis not present

## 2019-01-11 DIAGNOSIS — I480 Paroxysmal atrial fibrillation: Secondary | ICD-10-CM | POA: Diagnosis not present

## 2019-01-12 DIAGNOSIS — I2510 Atherosclerotic heart disease of native coronary artery without angina pectoris: Secondary | ICD-10-CM | POA: Diagnosis not present

## 2019-01-12 DIAGNOSIS — I480 Paroxysmal atrial fibrillation: Secondary | ICD-10-CM | POA: Diagnosis not present

## 2019-01-12 DIAGNOSIS — I110 Hypertensive heart disease with heart failure: Secondary | ICD-10-CM | POA: Diagnosis not present

## 2019-01-12 DIAGNOSIS — J449 Chronic obstructive pulmonary disease, unspecified: Secondary | ICD-10-CM | POA: Diagnosis not present

## 2019-01-12 DIAGNOSIS — J9611 Chronic respiratory failure with hypoxia: Secondary | ICD-10-CM | POA: Diagnosis not present

## 2019-01-12 DIAGNOSIS — I5032 Chronic diastolic (congestive) heart failure: Secondary | ICD-10-CM | POA: Diagnosis not present

## 2019-01-13 DIAGNOSIS — D649 Anemia, unspecified: Secondary | ICD-10-CM | POA: Diagnosis not present

## 2019-01-13 DIAGNOSIS — E785 Hyperlipidemia, unspecified: Secondary | ICD-10-CM | POA: Diagnosis not present

## 2019-01-13 DIAGNOSIS — I10 Essential (primary) hypertension: Secondary | ICD-10-CM | POA: Diagnosis not present

## 2019-01-13 DIAGNOSIS — E039 Hypothyroidism, unspecified: Secondary | ICD-10-CM | POA: Diagnosis not present

## 2019-01-13 DIAGNOSIS — E119 Type 2 diabetes mellitus without complications: Secondary | ICD-10-CM | POA: Diagnosis not present

## 2019-01-13 DIAGNOSIS — I110 Hypertensive heart disease with heart failure: Secondary | ICD-10-CM | POA: Diagnosis not present

## 2019-01-13 DIAGNOSIS — E559 Vitamin D deficiency, unspecified: Secondary | ICD-10-CM | POA: Diagnosis not present

## 2019-01-14 DIAGNOSIS — E1122 Type 2 diabetes mellitus with diabetic chronic kidney disease: Secondary | ICD-10-CM | POA: Diagnosis not present

## 2019-01-14 DIAGNOSIS — E559 Vitamin D deficiency, unspecified: Secondary | ICD-10-CM | POA: Diagnosis not present

## 2019-01-14 DIAGNOSIS — D649 Anemia, unspecified: Secondary | ICD-10-CM | POA: Diagnosis not present

## 2019-01-14 DIAGNOSIS — E039 Hypothyroidism, unspecified: Secondary | ICD-10-CM | POA: Diagnosis not present

## 2019-01-14 DIAGNOSIS — R799 Abnormal finding of blood chemistry, unspecified: Secondary | ICD-10-CM | POA: Diagnosis not present

## 2019-01-14 DIAGNOSIS — R54 Age-related physical debility: Secondary | ICD-10-CM | POA: Diagnosis not present

## 2019-01-14 DIAGNOSIS — N183 Chronic kidney disease, stage 3 (moderate): Secondary | ICD-10-CM | POA: Diagnosis not present

## 2019-01-17 DIAGNOSIS — I110 Hypertensive heart disease with heart failure: Secondary | ICD-10-CM | POA: Diagnosis not present

## 2019-01-17 DIAGNOSIS — E039 Hypothyroidism, unspecified: Secondary | ICD-10-CM | POA: Diagnosis not present

## 2019-01-17 DIAGNOSIS — I5032 Chronic diastolic (congestive) heart failure: Secondary | ICD-10-CM | POA: Diagnosis not present

## 2019-01-17 DIAGNOSIS — J449 Chronic obstructive pulmonary disease, unspecified: Secondary | ICD-10-CM | POA: Diagnosis not present

## 2019-01-17 DIAGNOSIS — I2510 Atherosclerotic heart disease of native coronary artery without angina pectoris: Secondary | ICD-10-CM | POA: Diagnosis not present

## 2019-01-17 DIAGNOSIS — I480 Paroxysmal atrial fibrillation: Secondary | ICD-10-CM | POA: Diagnosis not present

## 2019-01-17 DIAGNOSIS — J9611 Chronic respiratory failure with hypoxia: Secondary | ICD-10-CM | POA: Diagnosis not present

## 2019-01-20 DIAGNOSIS — I110 Hypertensive heart disease with heart failure: Secondary | ICD-10-CM | POA: Diagnosis not present

## 2019-01-20 DIAGNOSIS — I480 Paroxysmal atrial fibrillation: Secondary | ICD-10-CM | POA: Diagnosis not present

## 2019-01-20 DIAGNOSIS — I5032 Chronic diastolic (congestive) heart failure: Secondary | ICD-10-CM | POA: Diagnosis not present

## 2019-01-20 DIAGNOSIS — I2510 Atherosclerotic heart disease of native coronary artery without angina pectoris: Secondary | ICD-10-CM | POA: Diagnosis not present

## 2019-01-20 DIAGNOSIS — J449 Chronic obstructive pulmonary disease, unspecified: Secondary | ICD-10-CM | POA: Diagnosis not present

## 2019-01-20 DIAGNOSIS — J9611 Chronic respiratory failure with hypoxia: Secondary | ICD-10-CM | POA: Diagnosis not present

## 2019-01-22 DIAGNOSIS — I110 Hypertensive heart disease with heart failure: Secondary | ICD-10-CM | POA: Diagnosis not present

## 2019-01-22 DIAGNOSIS — G4733 Obstructive sleep apnea (adult) (pediatric): Secondary | ICD-10-CM | POA: Diagnosis not present

## 2019-01-22 DIAGNOSIS — Z86711 Personal history of pulmonary embolism: Secondary | ICD-10-CM | POA: Diagnosis not present

## 2019-01-22 DIAGNOSIS — Z6828 Body mass index (BMI) 28.0-28.9, adult: Secondary | ICD-10-CM | POA: Diagnosis not present

## 2019-01-22 DIAGNOSIS — J449 Chronic obstructive pulmonary disease, unspecified: Secondary | ICD-10-CM | POA: Diagnosis not present

## 2019-01-22 DIAGNOSIS — M1991 Primary osteoarthritis, unspecified site: Secondary | ICD-10-CM | POA: Diagnosis not present

## 2019-01-22 DIAGNOSIS — E876 Hypokalemia: Secondary | ICD-10-CM | POA: Diagnosis not present

## 2019-01-22 DIAGNOSIS — Z7982 Long term (current) use of aspirin: Secondary | ICD-10-CM | POA: Diagnosis not present

## 2019-01-22 DIAGNOSIS — M810 Age-related osteoporosis without current pathological fracture: Secondary | ICD-10-CM | POA: Diagnosis not present

## 2019-01-22 DIAGNOSIS — I2510 Atherosclerotic heart disease of native coronary artery without angina pectoris: Secondary | ICD-10-CM | POA: Diagnosis not present

## 2019-01-22 DIAGNOSIS — Z86718 Personal history of other venous thrombosis and embolism: Secondary | ICD-10-CM | POA: Diagnosis not present

## 2019-01-22 DIAGNOSIS — E785 Hyperlipidemia, unspecified: Secondary | ICD-10-CM | POA: Diagnosis not present

## 2019-01-22 DIAGNOSIS — K219 Gastro-esophageal reflux disease without esophagitis: Secondary | ICD-10-CM | POA: Diagnosis not present

## 2019-01-22 DIAGNOSIS — Z9981 Dependence on supplemental oxygen: Secondary | ICD-10-CM | POA: Diagnosis not present

## 2019-01-22 DIAGNOSIS — I480 Paroxysmal atrial fibrillation: Secondary | ICD-10-CM | POA: Diagnosis not present

## 2019-01-22 DIAGNOSIS — I5032 Chronic diastolic (congestive) heart failure: Secondary | ICD-10-CM | POA: Diagnosis not present

## 2019-01-22 DIAGNOSIS — J9611 Chronic respiratory failure with hypoxia: Secondary | ICD-10-CM | POA: Diagnosis not present

## 2019-01-22 DIAGNOSIS — E669 Obesity, unspecified: Secondary | ICD-10-CM | POA: Diagnosis not present

## 2019-01-22 DIAGNOSIS — Z7902 Long term (current) use of antithrombotics/antiplatelets: Secondary | ICD-10-CM | POA: Diagnosis not present

## 2019-01-22 DIAGNOSIS — Z8673 Personal history of transient ischemic attack (TIA), and cerebral infarction without residual deficits: Secondary | ICD-10-CM | POA: Diagnosis not present

## 2019-01-26 DIAGNOSIS — J9611 Chronic respiratory failure with hypoxia: Secondary | ICD-10-CM | POA: Diagnosis not present

## 2019-01-26 DIAGNOSIS — I480 Paroxysmal atrial fibrillation: Secondary | ICD-10-CM | POA: Diagnosis not present

## 2019-01-26 DIAGNOSIS — I2510 Atherosclerotic heart disease of native coronary artery without angina pectoris: Secondary | ICD-10-CM | POA: Diagnosis not present

## 2019-01-26 DIAGNOSIS — J449 Chronic obstructive pulmonary disease, unspecified: Secondary | ICD-10-CM | POA: Diagnosis not present

## 2019-01-26 DIAGNOSIS — I110 Hypertensive heart disease with heart failure: Secondary | ICD-10-CM | POA: Diagnosis not present

## 2019-01-26 DIAGNOSIS — I5032 Chronic diastolic (congestive) heart failure: Secondary | ICD-10-CM | POA: Diagnosis not present

## 2019-02-01 DIAGNOSIS — I4891 Unspecified atrial fibrillation: Secondary | ICD-10-CM | POA: Diagnosis not present

## 2019-02-01 DIAGNOSIS — M25551 Pain in right hip: Secondary | ICD-10-CM | POA: Diagnosis not present

## 2019-02-01 DIAGNOSIS — A084 Viral intestinal infection, unspecified: Secondary | ICD-10-CM | POA: Diagnosis not present

## 2019-02-01 DIAGNOSIS — M1990 Unspecified osteoarthritis, unspecified site: Secondary | ICD-10-CM | POA: Diagnosis not present

## 2019-02-01 DIAGNOSIS — I5020 Unspecified systolic (congestive) heart failure: Secondary | ICD-10-CM | POA: Diagnosis not present

## 2019-02-03 DIAGNOSIS — J9611 Chronic respiratory failure with hypoxia: Secondary | ICD-10-CM | POA: Diagnosis not present

## 2019-02-03 DIAGNOSIS — J449 Chronic obstructive pulmonary disease, unspecified: Secondary | ICD-10-CM | POA: Diagnosis not present

## 2019-02-03 DIAGNOSIS — I2510 Atherosclerotic heart disease of native coronary artery without angina pectoris: Secondary | ICD-10-CM | POA: Diagnosis not present

## 2019-02-03 DIAGNOSIS — I110 Hypertensive heart disease with heart failure: Secondary | ICD-10-CM | POA: Diagnosis not present

## 2019-02-03 DIAGNOSIS — I480 Paroxysmal atrial fibrillation: Secondary | ICD-10-CM | POA: Diagnosis not present

## 2019-02-03 DIAGNOSIS — I5032 Chronic diastolic (congestive) heart failure: Secondary | ICD-10-CM | POA: Diagnosis not present

## 2019-02-08 DIAGNOSIS — J449 Chronic obstructive pulmonary disease, unspecified: Secondary | ICD-10-CM | POA: Diagnosis not present

## 2019-02-08 DIAGNOSIS — I480 Paroxysmal atrial fibrillation: Secondary | ICD-10-CM | POA: Diagnosis not present

## 2019-02-08 DIAGNOSIS — J9611 Chronic respiratory failure with hypoxia: Secondary | ICD-10-CM | POA: Diagnosis not present

## 2019-02-08 DIAGNOSIS — I2510 Atherosclerotic heart disease of native coronary artery without angina pectoris: Secondary | ICD-10-CM | POA: Diagnosis not present

## 2019-02-08 DIAGNOSIS — I110 Hypertensive heart disease with heart failure: Secondary | ICD-10-CM | POA: Diagnosis not present

## 2019-02-08 DIAGNOSIS — I5032 Chronic diastolic (congestive) heart failure: Secondary | ICD-10-CM | POA: Diagnosis not present

## 2019-02-14 DIAGNOSIS — I2510 Atherosclerotic heart disease of native coronary artery without angina pectoris: Secondary | ICD-10-CM | POA: Diagnosis not present

## 2019-02-14 DIAGNOSIS — I5032 Chronic diastolic (congestive) heart failure: Secondary | ICD-10-CM | POA: Diagnosis not present

## 2019-02-14 DIAGNOSIS — J449 Chronic obstructive pulmonary disease, unspecified: Secondary | ICD-10-CM | POA: Diagnosis not present

## 2019-02-14 DIAGNOSIS — J9611 Chronic respiratory failure with hypoxia: Secondary | ICD-10-CM | POA: Diagnosis not present

## 2019-02-14 DIAGNOSIS — I480 Paroxysmal atrial fibrillation: Secondary | ICD-10-CM | POA: Diagnosis not present

## 2019-02-14 DIAGNOSIS — I110 Hypertensive heart disease with heart failure: Secondary | ICD-10-CM | POA: Diagnosis not present

## 2019-02-16 DIAGNOSIS — B3789 Other sites of candidiasis: Secondary | ICD-10-CM | POA: Diagnosis not present

## 2019-02-16 DIAGNOSIS — F0390 Unspecified dementia without behavioral disturbance: Secondary | ICD-10-CM | POA: Diagnosis not present

## 2019-02-17 DIAGNOSIS — I110 Hypertensive heart disease with heart failure: Secondary | ICD-10-CM | POA: Diagnosis not present

## 2019-02-17 DIAGNOSIS — J9611 Chronic respiratory failure with hypoxia: Secondary | ICD-10-CM | POA: Diagnosis not present

## 2019-02-17 DIAGNOSIS — J449 Chronic obstructive pulmonary disease, unspecified: Secondary | ICD-10-CM | POA: Diagnosis not present

## 2019-02-17 DIAGNOSIS — I480 Paroxysmal atrial fibrillation: Secondary | ICD-10-CM | POA: Diagnosis not present

## 2019-02-17 DIAGNOSIS — I5032 Chronic diastolic (congestive) heart failure: Secondary | ICD-10-CM | POA: Diagnosis not present

## 2019-02-17 DIAGNOSIS — I2510 Atherosclerotic heart disease of native coronary artery without angina pectoris: Secondary | ICD-10-CM | POA: Diagnosis not present

## 2019-02-21 DIAGNOSIS — Z86718 Personal history of other venous thrombosis and embolism: Secondary | ICD-10-CM | POA: Diagnosis not present

## 2019-02-21 DIAGNOSIS — E669 Obesity, unspecified: Secondary | ICD-10-CM | POA: Diagnosis not present

## 2019-02-21 DIAGNOSIS — Z8673 Personal history of transient ischemic attack (TIA), and cerebral infarction without residual deficits: Secondary | ICD-10-CM | POA: Diagnosis not present

## 2019-02-21 DIAGNOSIS — K219 Gastro-esophageal reflux disease without esophagitis: Secondary | ICD-10-CM | POA: Diagnosis not present

## 2019-02-21 DIAGNOSIS — I5032 Chronic diastolic (congestive) heart failure: Secondary | ICD-10-CM | POA: Diagnosis not present

## 2019-02-21 DIAGNOSIS — E785 Hyperlipidemia, unspecified: Secondary | ICD-10-CM | POA: Diagnosis not present

## 2019-02-21 DIAGNOSIS — Z9981 Dependence on supplemental oxygen: Secondary | ICD-10-CM | POA: Diagnosis not present

## 2019-02-21 DIAGNOSIS — Z86711 Personal history of pulmonary embolism: Secondary | ICD-10-CM | POA: Diagnosis not present

## 2019-02-21 DIAGNOSIS — I2510 Atherosclerotic heart disease of native coronary artery without angina pectoris: Secondary | ICD-10-CM | POA: Diagnosis not present

## 2019-02-21 DIAGNOSIS — J449 Chronic obstructive pulmonary disease, unspecified: Secondary | ICD-10-CM | POA: Diagnosis not present

## 2019-02-21 DIAGNOSIS — J9611 Chronic respiratory failure with hypoxia: Secondary | ICD-10-CM | POA: Diagnosis not present

## 2019-02-21 DIAGNOSIS — M810 Age-related osteoporosis without current pathological fracture: Secondary | ICD-10-CM | POA: Diagnosis not present

## 2019-02-21 DIAGNOSIS — M1991 Primary osteoarthritis, unspecified site: Secondary | ICD-10-CM | POA: Diagnosis not present

## 2019-02-21 DIAGNOSIS — Z7902 Long term (current) use of antithrombotics/antiplatelets: Secondary | ICD-10-CM | POA: Diagnosis not present

## 2019-02-21 DIAGNOSIS — I480 Paroxysmal atrial fibrillation: Secondary | ICD-10-CM | POA: Diagnosis not present

## 2019-02-21 DIAGNOSIS — G4733 Obstructive sleep apnea (adult) (pediatric): Secondary | ICD-10-CM | POA: Diagnosis not present

## 2019-02-21 DIAGNOSIS — Z6828 Body mass index (BMI) 28.0-28.9, adult: Secondary | ICD-10-CM | POA: Diagnosis not present

## 2019-02-21 DIAGNOSIS — E876 Hypokalemia: Secondary | ICD-10-CM | POA: Diagnosis not present

## 2019-02-21 DIAGNOSIS — Z7982 Long term (current) use of aspirin: Secondary | ICD-10-CM | POA: Diagnosis not present

## 2019-02-21 DIAGNOSIS — I110 Hypertensive heart disease with heart failure: Secondary | ICD-10-CM | POA: Diagnosis not present

## 2019-03-10 DIAGNOSIS — R05 Cough: Secondary | ICD-10-CM | POA: Diagnosis not present

## 2019-03-10 DIAGNOSIS — R0989 Other specified symptoms and signs involving the circulatory and respiratory systems: Secondary | ICD-10-CM | POA: Diagnosis not present

## 2019-03-10 DIAGNOSIS — R600 Localized edema: Secondary | ICD-10-CM | POA: Diagnosis not present

## 2019-03-10 DIAGNOSIS — F0390 Unspecified dementia without behavioral disturbance: Secondary | ICD-10-CM | POA: Diagnosis not present

## 2019-03-10 DIAGNOSIS — I509 Heart failure, unspecified: Secondary | ICD-10-CM | POA: Diagnosis not present

## 2019-03-11 DIAGNOSIS — E559 Vitamin D deficiency, unspecified: Secondary | ICD-10-CM | POA: Diagnosis not present

## 2019-03-14 DIAGNOSIS — I2510 Atherosclerotic heart disease of native coronary artery without angina pectoris: Secondary | ICD-10-CM | POA: Diagnosis not present

## 2019-03-14 DIAGNOSIS — I110 Hypertensive heart disease with heart failure: Secondary | ICD-10-CM | POA: Diagnosis not present

## 2019-03-14 DIAGNOSIS — I480 Paroxysmal atrial fibrillation: Secondary | ICD-10-CM | POA: Diagnosis not present

## 2019-03-14 DIAGNOSIS — J9611 Chronic respiratory failure with hypoxia: Secondary | ICD-10-CM | POA: Diagnosis not present

## 2019-03-14 DIAGNOSIS — J449 Chronic obstructive pulmonary disease, unspecified: Secondary | ICD-10-CM | POA: Diagnosis not present

## 2019-03-14 DIAGNOSIS — I5032 Chronic diastolic (congestive) heart failure: Secondary | ICD-10-CM | POA: Diagnosis not present

## 2019-03-23 DIAGNOSIS — I2510 Atherosclerotic heart disease of native coronary artery without angina pectoris: Secondary | ICD-10-CM | POA: Diagnosis not present

## 2019-03-23 DIAGNOSIS — I480 Paroxysmal atrial fibrillation: Secondary | ICD-10-CM | POA: Diagnosis not present

## 2019-03-23 DIAGNOSIS — M1991 Primary osteoarthritis, unspecified site: Secondary | ICD-10-CM | POA: Diagnosis not present

## 2019-03-23 DIAGNOSIS — K219 Gastro-esophageal reflux disease without esophagitis: Secondary | ICD-10-CM | POA: Diagnosis not present

## 2019-03-23 DIAGNOSIS — E669 Obesity, unspecified: Secondary | ICD-10-CM | POA: Diagnosis not present

## 2019-03-23 DIAGNOSIS — Z6828 Body mass index (BMI) 28.0-28.9, adult: Secondary | ICD-10-CM | POA: Diagnosis not present

## 2019-03-23 DIAGNOSIS — Z9981 Dependence on supplemental oxygen: Secondary | ICD-10-CM | POA: Diagnosis not present

## 2019-03-23 DIAGNOSIS — M810 Age-related osteoporosis without current pathological fracture: Secondary | ICD-10-CM | POA: Diagnosis not present

## 2019-03-23 DIAGNOSIS — E785 Hyperlipidemia, unspecified: Secondary | ICD-10-CM | POA: Diagnosis not present

## 2019-03-23 DIAGNOSIS — Z7982 Long term (current) use of aspirin: Secondary | ICD-10-CM | POA: Diagnosis not present

## 2019-03-23 DIAGNOSIS — Z7902 Long term (current) use of antithrombotics/antiplatelets: Secondary | ICD-10-CM | POA: Diagnosis not present

## 2019-03-23 DIAGNOSIS — Z86711 Personal history of pulmonary embolism: Secondary | ICD-10-CM | POA: Diagnosis not present

## 2019-03-23 DIAGNOSIS — J449 Chronic obstructive pulmonary disease, unspecified: Secondary | ICD-10-CM | POA: Diagnosis not present

## 2019-03-23 DIAGNOSIS — E876 Hypokalemia: Secondary | ICD-10-CM | POA: Diagnosis not present

## 2019-03-23 DIAGNOSIS — I5032 Chronic diastolic (congestive) heart failure: Secondary | ICD-10-CM | POA: Diagnosis not present

## 2019-03-23 DIAGNOSIS — G4733 Obstructive sleep apnea (adult) (pediatric): Secondary | ICD-10-CM | POA: Diagnosis not present

## 2019-03-23 DIAGNOSIS — Z8673 Personal history of transient ischemic attack (TIA), and cerebral infarction without residual deficits: Secondary | ICD-10-CM | POA: Diagnosis not present

## 2019-03-23 DIAGNOSIS — I110 Hypertensive heart disease with heart failure: Secondary | ICD-10-CM | POA: Diagnosis not present

## 2019-03-23 DIAGNOSIS — Z86718 Personal history of other venous thrombosis and embolism: Secondary | ICD-10-CM | POA: Diagnosis not present

## 2019-03-23 DIAGNOSIS — J9611 Chronic respiratory failure with hypoxia: Secondary | ICD-10-CM | POA: Diagnosis not present

## 2019-03-29 DIAGNOSIS — I5032 Chronic diastolic (congestive) heart failure: Secondary | ICD-10-CM | POA: Diagnosis not present

## 2019-03-29 DIAGNOSIS — I2510 Atherosclerotic heart disease of native coronary artery without angina pectoris: Secondary | ICD-10-CM | POA: Diagnosis not present

## 2019-03-29 DIAGNOSIS — J449 Chronic obstructive pulmonary disease, unspecified: Secondary | ICD-10-CM | POA: Diagnosis not present

## 2019-03-29 DIAGNOSIS — I480 Paroxysmal atrial fibrillation: Secondary | ICD-10-CM | POA: Diagnosis not present

## 2019-03-29 DIAGNOSIS — J9611 Chronic respiratory failure with hypoxia: Secondary | ICD-10-CM | POA: Diagnosis not present

## 2019-03-29 DIAGNOSIS — I110 Hypertensive heart disease with heart failure: Secondary | ICD-10-CM | POA: Diagnosis not present

## 2019-04-06 DIAGNOSIS — I110 Hypertensive heart disease with heart failure: Secondary | ICD-10-CM | POA: Diagnosis not present

## 2019-04-06 DIAGNOSIS — I480 Paroxysmal atrial fibrillation: Secondary | ICD-10-CM | POA: Diagnosis not present

## 2019-04-06 DIAGNOSIS — J449 Chronic obstructive pulmonary disease, unspecified: Secondary | ICD-10-CM | POA: Diagnosis not present

## 2019-04-06 DIAGNOSIS — I5032 Chronic diastolic (congestive) heart failure: Secondary | ICD-10-CM | POA: Diagnosis not present

## 2019-04-06 DIAGNOSIS — I2510 Atherosclerotic heart disease of native coronary artery without angina pectoris: Secondary | ICD-10-CM | POA: Diagnosis not present

## 2019-04-06 DIAGNOSIS — J9611 Chronic respiratory failure with hypoxia: Secondary | ICD-10-CM | POA: Diagnosis not present

## 2019-04-07 DIAGNOSIS — R600 Localized edema: Secondary | ICD-10-CM | POA: Diagnosis not present

## 2019-04-07 DIAGNOSIS — N183 Chronic kidney disease, stage 3 (moderate): Secondary | ICD-10-CM | POA: Diagnosis not present

## 2019-04-07 DIAGNOSIS — I5020 Unspecified systolic (congestive) heart failure: Secondary | ICD-10-CM | POA: Diagnosis not present

## 2019-04-07 DIAGNOSIS — F0390 Unspecified dementia without behavioral disturbance: Secondary | ICD-10-CM | POA: Diagnosis not present

## 2019-04-07 DIAGNOSIS — E1122 Type 2 diabetes mellitus with diabetic chronic kidney disease: Secondary | ICD-10-CM | POA: Diagnosis not present

## 2019-04-07 DIAGNOSIS — J449 Chronic obstructive pulmonary disease, unspecified: Secondary | ICD-10-CM | POA: Diagnosis not present

## 2019-04-07 DIAGNOSIS — I129 Hypertensive chronic kidney disease with stage 1 through stage 4 chronic kidney disease, or unspecified chronic kidney disease: Secondary | ICD-10-CM | POA: Diagnosis not present

## 2019-04-07 DIAGNOSIS — I4891 Unspecified atrial fibrillation: Secondary | ICD-10-CM | POA: Diagnosis not present

## 2019-04-11 DIAGNOSIS — N179 Acute kidney failure, unspecified: Secondary | ICD-10-CM | POA: Diagnosis not present

## 2019-04-11 DIAGNOSIS — D649 Anemia, unspecified: Secondary | ICD-10-CM | POA: Diagnosis not present

## 2019-04-11 DIAGNOSIS — R7303 Prediabetes: Secondary | ICD-10-CM | POA: Diagnosis not present

## 2019-04-11 DIAGNOSIS — I10 Essential (primary) hypertension: Secondary | ICD-10-CM | POA: Diagnosis not present

## 2019-04-13 DIAGNOSIS — I480 Paroxysmal atrial fibrillation: Secondary | ICD-10-CM | POA: Diagnosis not present

## 2019-04-13 DIAGNOSIS — J9611 Chronic respiratory failure with hypoxia: Secondary | ICD-10-CM | POA: Diagnosis not present

## 2019-04-13 DIAGNOSIS — I2510 Atherosclerotic heart disease of native coronary artery without angina pectoris: Secondary | ICD-10-CM | POA: Diagnosis not present

## 2019-04-13 DIAGNOSIS — I5032 Chronic diastolic (congestive) heart failure: Secondary | ICD-10-CM | POA: Diagnosis not present

## 2019-04-13 DIAGNOSIS — J449 Chronic obstructive pulmonary disease, unspecified: Secondary | ICD-10-CM | POA: Diagnosis not present

## 2019-04-13 DIAGNOSIS — I110 Hypertensive heart disease with heart failure: Secondary | ICD-10-CM | POA: Diagnosis not present

## 2019-04-18 DIAGNOSIS — J9611 Chronic respiratory failure with hypoxia: Secondary | ICD-10-CM | POA: Diagnosis not present

## 2019-04-18 DIAGNOSIS — I110 Hypertensive heart disease with heart failure: Secondary | ICD-10-CM | POA: Diagnosis not present

## 2019-04-18 DIAGNOSIS — J449 Chronic obstructive pulmonary disease, unspecified: Secondary | ICD-10-CM | POA: Diagnosis not present

## 2019-04-18 DIAGNOSIS — I480 Paroxysmal atrial fibrillation: Secondary | ICD-10-CM | POA: Diagnosis not present

## 2019-04-18 DIAGNOSIS — I2510 Atherosclerotic heart disease of native coronary artery without angina pectoris: Secondary | ICD-10-CM | POA: Diagnosis not present

## 2019-04-18 DIAGNOSIS — G3184 Mild cognitive impairment, so stated: Secondary | ICD-10-CM | POA: Diagnosis not present

## 2019-04-18 DIAGNOSIS — J309 Allergic rhinitis, unspecified: Secondary | ICD-10-CM | POA: Diagnosis not present

## 2019-04-18 DIAGNOSIS — I5032 Chronic diastolic (congestive) heart failure: Secondary | ICD-10-CM | POA: Diagnosis not present

## 2019-04-18 DIAGNOSIS — F0390 Unspecified dementia without behavioral disturbance: Secondary | ICD-10-CM | POA: Diagnosis not present

## 2019-04-22 DIAGNOSIS — I5032 Chronic diastolic (congestive) heart failure: Secondary | ICD-10-CM | POA: Diagnosis not present

## 2019-04-22 DIAGNOSIS — K219 Gastro-esophageal reflux disease without esophagitis: Secondary | ICD-10-CM | POA: Diagnosis not present

## 2019-04-22 DIAGNOSIS — I110 Hypertensive heart disease with heart failure: Secondary | ICD-10-CM | POA: Diagnosis not present

## 2019-04-22 DIAGNOSIS — E669 Obesity, unspecified: Secondary | ICD-10-CM | POA: Diagnosis not present

## 2019-04-22 DIAGNOSIS — E876 Hypokalemia: Secondary | ICD-10-CM | POA: Diagnosis not present

## 2019-04-22 DIAGNOSIS — Z6828 Body mass index (BMI) 28.0-28.9, adult: Secondary | ICD-10-CM | POA: Diagnosis not present

## 2019-04-22 DIAGNOSIS — M810 Age-related osteoporosis without current pathological fracture: Secondary | ICD-10-CM | POA: Diagnosis not present

## 2019-04-22 DIAGNOSIS — Z7902 Long term (current) use of antithrombotics/antiplatelets: Secondary | ICD-10-CM | POA: Diagnosis not present

## 2019-04-22 DIAGNOSIS — E785 Hyperlipidemia, unspecified: Secondary | ICD-10-CM | POA: Diagnosis not present

## 2019-04-22 DIAGNOSIS — I2510 Atherosclerotic heart disease of native coronary artery without angina pectoris: Secondary | ICD-10-CM | POA: Diagnosis not present

## 2019-04-22 DIAGNOSIS — M1991 Primary osteoarthritis, unspecified site: Secondary | ICD-10-CM | POA: Diagnosis not present

## 2019-04-22 DIAGNOSIS — J449 Chronic obstructive pulmonary disease, unspecified: Secondary | ICD-10-CM | POA: Diagnosis not present

## 2019-04-22 DIAGNOSIS — Z8673 Personal history of transient ischemic attack (TIA), and cerebral infarction without residual deficits: Secondary | ICD-10-CM | POA: Diagnosis not present

## 2019-04-22 DIAGNOSIS — G4733 Obstructive sleep apnea (adult) (pediatric): Secondary | ICD-10-CM | POA: Diagnosis not present

## 2019-04-22 DIAGNOSIS — Z86711 Personal history of pulmonary embolism: Secondary | ICD-10-CM | POA: Diagnosis not present

## 2019-04-22 DIAGNOSIS — I480 Paroxysmal atrial fibrillation: Secondary | ICD-10-CM | POA: Diagnosis not present

## 2019-04-22 DIAGNOSIS — Z86718 Personal history of other venous thrombosis and embolism: Secondary | ICD-10-CM | POA: Diagnosis not present

## 2019-04-22 DIAGNOSIS — J9611 Chronic respiratory failure with hypoxia: Secondary | ICD-10-CM | POA: Diagnosis not present

## 2019-04-22 DIAGNOSIS — Z9981 Dependence on supplemental oxygen: Secondary | ICD-10-CM | POA: Diagnosis not present

## 2019-04-22 DIAGNOSIS — Z7982 Long term (current) use of aspirin: Secondary | ICD-10-CM | POA: Diagnosis not present

## 2019-04-27 DIAGNOSIS — M79671 Pain in right foot: Secondary | ICD-10-CM | POA: Diagnosis not present

## 2019-04-27 DIAGNOSIS — F0390 Unspecified dementia without behavioral disturbance: Secondary | ICD-10-CM | POA: Diagnosis not present

## 2019-04-27 DIAGNOSIS — M25571 Pain in right ankle and joints of right foot: Secondary | ICD-10-CM | POA: Diagnosis not present

## 2019-04-28 DIAGNOSIS — F0390 Unspecified dementia without behavioral disturbance: Secondary | ICD-10-CM | POA: Diagnosis not present

## 2019-04-28 DIAGNOSIS — M79671 Pain in right foot: Secondary | ICD-10-CM | POA: Diagnosis not present

## 2019-04-28 DIAGNOSIS — M1990 Unspecified osteoarthritis, unspecified site: Secondary | ICD-10-CM | POA: Diagnosis not present

## 2019-04-28 DIAGNOSIS — R600 Localized edema: Secondary | ICD-10-CM | POA: Diagnosis not present

## 2019-04-28 DIAGNOSIS — I5020 Unspecified systolic (congestive) heart failure: Secondary | ICD-10-CM | POA: Diagnosis not present

## 2019-05-02 DIAGNOSIS — I5032 Chronic diastolic (congestive) heart failure: Secondary | ICD-10-CM | POA: Diagnosis not present

## 2019-05-02 DIAGNOSIS — I480 Paroxysmal atrial fibrillation: Secondary | ICD-10-CM | POA: Diagnosis not present

## 2019-05-02 DIAGNOSIS — J9611 Chronic respiratory failure with hypoxia: Secondary | ICD-10-CM | POA: Diagnosis not present

## 2019-05-02 DIAGNOSIS — J449 Chronic obstructive pulmonary disease, unspecified: Secondary | ICD-10-CM | POA: Diagnosis not present

## 2019-05-02 DIAGNOSIS — I2510 Atherosclerotic heart disease of native coronary artery without angina pectoris: Secondary | ICD-10-CM | POA: Diagnosis not present

## 2019-05-02 DIAGNOSIS — I110 Hypertensive heart disease with heart failure: Secondary | ICD-10-CM | POA: Diagnosis not present

## 2019-05-04 DIAGNOSIS — F0390 Unspecified dementia without behavioral disturbance: Secondary | ICD-10-CM | POA: Diagnosis not present

## 2019-05-04 DIAGNOSIS — R5383 Other fatigue: Secondary | ICD-10-CM | POA: Diagnosis not present

## 2019-05-05 DIAGNOSIS — D510 Vitamin B12 deficiency anemia due to intrinsic factor deficiency: Secondary | ICD-10-CM | POA: Diagnosis not present

## 2019-05-05 DIAGNOSIS — L539 Erythematous condition, unspecified: Secondary | ICD-10-CM | POA: Diagnosis not present

## 2019-05-05 DIAGNOSIS — E559 Vitamin D deficiency, unspecified: Secondary | ICD-10-CM | POA: Diagnosis not present

## 2019-05-05 DIAGNOSIS — N390 Urinary tract infection, site not specified: Secondary | ICD-10-CM | POA: Diagnosis not present

## 2019-05-05 DIAGNOSIS — E039 Hypothyroidism, unspecified: Secondary | ICD-10-CM | POA: Diagnosis not present

## 2019-05-05 DIAGNOSIS — D649 Anemia, unspecified: Secondary | ICD-10-CM | POA: Diagnosis not present

## 2019-05-05 DIAGNOSIS — I10 Essential (primary) hypertension: Secondary | ICD-10-CM | POA: Diagnosis not present

## 2019-05-06 DIAGNOSIS — N183 Chronic kidney disease, stage 3 (moderate): Secondary | ICD-10-CM | POA: Diagnosis not present

## 2019-05-06 DIAGNOSIS — D649 Anemia, unspecified: Secondary | ICD-10-CM | POA: Diagnosis not present

## 2019-05-06 DIAGNOSIS — E559 Vitamin D deficiency, unspecified: Secondary | ICD-10-CM | POA: Diagnosis not present

## 2019-05-06 DIAGNOSIS — F0390 Unspecified dementia without behavioral disturbance: Secondary | ICD-10-CM | POA: Diagnosis not present

## 2019-05-06 DIAGNOSIS — R799 Abnormal finding of blood chemistry, unspecified: Secondary | ICD-10-CM | POA: Diagnosis not present

## 2019-05-12 DIAGNOSIS — I5032 Chronic diastolic (congestive) heart failure: Secondary | ICD-10-CM | POA: Diagnosis not present

## 2019-05-12 DIAGNOSIS — J9611 Chronic respiratory failure with hypoxia: Secondary | ICD-10-CM | POA: Diagnosis not present

## 2019-05-12 DIAGNOSIS — I480 Paroxysmal atrial fibrillation: Secondary | ICD-10-CM | POA: Diagnosis not present

## 2019-05-12 DIAGNOSIS — I2510 Atherosclerotic heart disease of native coronary artery without angina pectoris: Secondary | ICD-10-CM | POA: Diagnosis not present

## 2019-05-12 DIAGNOSIS — J449 Chronic obstructive pulmonary disease, unspecified: Secondary | ICD-10-CM | POA: Diagnosis not present

## 2019-05-12 DIAGNOSIS — I110 Hypertensive heart disease with heart failure: Secondary | ICD-10-CM | POA: Diagnosis not present

## 2019-05-16 DIAGNOSIS — I480 Paroxysmal atrial fibrillation: Secondary | ICD-10-CM | POA: Diagnosis not present

## 2019-05-16 DIAGNOSIS — J449 Chronic obstructive pulmonary disease, unspecified: Secondary | ICD-10-CM | POA: Diagnosis not present

## 2019-05-16 DIAGNOSIS — I2510 Atherosclerotic heart disease of native coronary artery without angina pectoris: Secondary | ICD-10-CM | POA: Diagnosis not present

## 2019-05-16 DIAGNOSIS — J9611 Chronic respiratory failure with hypoxia: Secondary | ICD-10-CM | POA: Diagnosis not present

## 2019-05-16 DIAGNOSIS — I5032 Chronic diastolic (congestive) heart failure: Secondary | ICD-10-CM | POA: Diagnosis not present

## 2019-05-16 DIAGNOSIS — I110 Hypertensive heart disease with heart failure: Secondary | ICD-10-CM | POA: Diagnosis not present

## 2019-05-22 DIAGNOSIS — I110 Hypertensive heart disease with heart failure: Secondary | ICD-10-CM | POA: Diagnosis not present

## 2019-05-22 DIAGNOSIS — M810 Age-related osteoporosis without current pathological fracture: Secondary | ICD-10-CM | POA: Diagnosis not present

## 2019-05-22 DIAGNOSIS — E785 Hyperlipidemia, unspecified: Secondary | ICD-10-CM | POA: Diagnosis not present

## 2019-05-22 DIAGNOSIS — I480 Paroxysmal atrial fibrillation: Secondary | ICD-10-CM | POA: Diagnosis not present

## 2019-05-22 DIAGNOSIS — E669 Obesity, unspecified: Secondary | ICD-10-CM | POA: Diagnosis not present

## 2019-05-22 DIAGNOSIS — Z9981 Dependence on supplemental oxygen: Secondary | ICD-10-CM | POA: Diagnosis not present

## 2019-05-22 DIAGNOSIS — J449 Chronic obstructive pulmonary disease, unspecified: Secondary | ICD-10-CM | POA: Diagnosis not present

## 2019-05-22 DIAGNOSIS — K219 Gastro-esophageal reflux disease without esophagitis: Secondary | ICD-10-CM | POA: Diagnosis not present

## 2019-05-22 DIAGNOSIS — M1991 Primary osteoarthritis, unspecified site: Secondary | ICD-10-CM | POA: Diagnosis not present

## 2019-05-22 DIAGNOSIS — Z7902 Long term (current) use of antithrombotics/antiplatelets: Secondary | ICD-10-CM | POA: Diagnosis not present

## 2019-05-22 DIAGNOSIS — Z7982 Long term (current) use of aspirin: Secondary | ICD-10-CM | POA: Diagnosis not present

## 2019-05-22 DIAGNOSIS — Z86718 Personal history of other venous thrombosis and embolism: Secondary | ICD-10-CM | POA: Diagnosis not present

## 2019-05-22 DIAGNOSIS — Z8673 Personal history of transient ischemic attack (TIA), and cerebral infarction without residual deficits: Secondary | ICD-10-CM | POA: Diagnosis not present

## 2019-05-22 DIAGNOSIS — Z6828 Body mass index (BMI) 28.0-28.9, adult: Secondary | ICD-10-CM | POA: Diagnosis not present

## 2019-05-22 DIAGNOSIS — G4733 Obstructive sleep apnea (adult) (pediatric): Secondary | ICD-10-CM | POA: Diagnosis not present

## 2019-05-22 DIAGNOSIS — I2510 Atherosclerotic heart disease of native coronary artery without angina pectoris: Secondary | ICD-10-CM | POA: Diagnosis not present

## 2019-05-22 DIAGNOSIS — Z86711 Personal history of pulmonary embolism: Secondary | ICD-10-CM | POA: Diagnosis not present

## 2019-05-22 DIAGNOSIS — E876 Hypokalemia: Secondary | ICD-10-CM | POA: Diagnosis not present

## 2019-05-22 DIAGNOSIS — I5032 Chronic diastolic (congestive) heart failure: Secondary | ICD-10-CM | POA: Diagnosis not present

## 2019-05-22 DIAGNOSIS — J9611 Chronic respiratory failure with hypoxia: Secondary | ICD-10-CM | POA: Diagnosis not present

## 2019-05-23 DIAGNOSIS — J449 Chronic obstructive pulmonary disease, unspecified: Secondary | ICD-10-CM | POA: Diagnosis not present

## 2019-05-23 DIAGNOSIS — I2510 Atherosclerotic heart disease of native coronary artery without angina pectoris: Secondary | ICD-10-CM | POA: Diagnosis not present

## 2019-05-23 DIAGNOSIS — I480 Paroxysmal atrial fibrillation: Secondary | ICD-10-CM | POA: Diagnosis not present

## 2019-05-23 DIAGNOSIS — I110 Hypertensive heart disease with heart failure: Secondary | ICD-10-CM | POA: Diagnosis not present

## 2019-05-23 DIAGNOSIS — I5032 Chronic diastolic (congestive) heart failure: Secondary | ICD-10-CM | POA: Diagnosis not present

## 2019-05-23 DIAGNOSIS — J9611 Chronic respiratory failure with hypoxia: Secondary | ICD-10-CM | POA: Diagnosis not present

## 2019-05-25 DIAGNOSIS — R410 Disorientation, unspecified: Secondary | ICD-10-CM | POA: Diagnosis not present

## 2019-05-25 DIAGNOSIS — F0390 Unspecified dementia without behavioral disturbance: Secondary | ICD-10-CM | POA: Diagnosis not present

## 2019-05-25 DIAGNOSIS — R52 Pain, unspecified: Secondary | ICD-10-CM | POA: Diagnosis not present

## 2019-05-30 DIAGNOSIS — J9611 Chronic respiratory failure with hypoxia: Secondary | ICD-10-CM | POA: Diagnosis not present

## 2019-05-30 DIAGNOSIS — I5032 Chronic diastolic (congestive) heart failure: Secondary | ICD-10-CM | POA: Diagnosis not present

## 2019-05-30 DIAGNOSIS — I480 Paroxysmal atrial fibrillation: Secondary | ICD-10-CM | POA: Diagnosis not present

## 2019-05-30 DIAGNOSIS — J449 Chronic obstructive pulmonary disease, unspecified: Secondary | ICD-10-CM | POA: Diagnosis not present

## 2019-05-30 DIAGNOSIS — I2510 Atherosclerotic heart disease of native coronary artery without angina pectoris: Secondary | ICD-10-CM | POA: Diagnosis not present

## 2019-05-30 DIAGNOSIS — I110 Hypertensive heart disease with heart failure: Secondary | ICD-10-CM | POA: Diagnosis not present

## 2019-06-01 DIAGNOSIS — F0390 Unspecified dementia without behavioral disturbance: Secondary | ICD-10-CM | POA: Diagnosis not present

## 2019-06-01 DIAGNOSIS — B356 Tinea cruris: Secondary | ICD-10-CM | POA: Diagnosis not present

## 2019-06-06 DIAGNOSIS — I110 Hypertensive heart disease with heart failure: Secondary | ICD-10-CM | POA: Diagnosis not present

## 2019-06-06 DIAGNOSIS — J449 Chronic obstructive pulmonary disease, unspecified: Secondary | ICD-10-CM | POA: Diagnosis not present

## 2019-06-06 DIAGNOSIS — I5032 Chronic diastolic (congestive) heart failure: Secondary | ICD-10-CM | POA: Diagnosis not present

## 2019-06-06 DIAGNOSIS — I2510 Atherosclerotic heart disease of native coronary artery without angina pectoris: Secondary | ICD-10-CM | POA: Diagnosis not present

## 2019-06-06 DIAGNOSIS — I480 Paroxysmal atrial fibrillation: Secondary | ICD-10-CM | POA: Diagnosis not present

## 2019-06-06 DIAGNOSIS — J9611 Chronic respiratory failure with hypoxia: Secondary | ICD-10-CM | POA: Diagnosis not present

## 2019-06-16 DIAGNOSIS — I5032 Chronic diastolic (congestive) heart failure: Secondary | ICD-10-CM | POA: Diagnosis not present

## 2019-06-16 DIAGNOSIS — I110 Hypertensive heart disease with heart failure: Secondary | ICD-10-CM | POA: Diagnosis not present

## 2019-06-16 DIAGNOSIS — J9611 Chronic respiratory failure with hypoxia: Secondary | ICD-10-CM | POA: Diagnosis not present

## 2019-06-16 DIAGNOSIS — I2510 Atherosclerotic heart disease of native coronary artery without angina pectoris: Secondary | ICD-10-CM | POA: Diagnosis not present

## 2019-06-16 DIAGNOSIS — I480 Paroxysmal atrial fibrillation: Secondary | ICD-10-CM | POA: Diagnosis not present

## 2019-06-16 DIAGNOSIS — J449 Chronic obstructive pulmonary disease, unspecified: Secondary | ICD-10-CM | POA: Diagnosis not present

## 2019-06-21 DIAGNOSIS — Z7902 Long term (current) use of antithrombotics/antiplatelets: Secondary | ICD-10-CM | POA: Diagnosis not present

## 2019-06-21 DIAGNOSIS — E785 Hyperlipidemia, unspecified: Secondary | ICD-10-CM | POA: Diagnosis not present

## 2019-06-21 DIAGNOSIS — G4733 Obstructive sleep apnea (adult) (pediatric): Secondary | ICD-10-CM | POA: Diagnosis not present

## 2019-06-21 DIAGNOSIS — I2510 Atherosclerotic heart disease of native coronary artery without angina pectoris: Secondary | ICD-10-CM | POA: Diagnosis not present

## 2019-06-21 DIAGNOSIS — Z86711 Personal history of pulmonary embolism: Secondary | ICD-10-CM | POA: Diagnosis not present

## 2019-06-21 DIAGNOSIS — Z7982 Long term (current) use of aspirin: Secondary | ICD-10-CM | POA: Diagnosis not present

## 2019-06-21 DIAGNOSIS — Z9981 Dependence on supplemental oxygen: Secondary | ICD-10-CM | POA: Diagnosis not present

## 2019-06-21 DIAGNOSIS — I110 Hypertensive heart disease with heart failure: Secondary | ICD-10-CM | POA: Diagnosis not present

## 2019-06-21 DIAGNOSIS — E876 Hypokalemia: Secondary | ICD-10-CM | POA: Diagnosis not present

## 2019-06-21 DIAGNOSIS — M810 Age-related osteoporosis without current pathological fracture: Secondary | ICD-10-CM | POA: Diagnosis not present

## 2019-06-21 DIAGNOSIS — I480 Paroxysmal atrial fibrillation: Secondary | ICD-10-CM | POA: Diagnosis not present

## 2019-06-21 DIAGNOSIS — M1991 Primary osteoarthritis, unspecified site: Secondary | ICD-10-CM | POA: Diagnosis not present

## 2019-06-21 DIAGNOSIS — Z8673 Personal history of transient ischemic attack (TIA), and cerebral infarction without residual deficits: Secondary | ICD-10-CM | POA: Diagnosis not present

## 2019-06-21 DIAGNOSIS — K219 Gastro-esophageal reflux disease without esophagitis: Secondary | ICD-10-CM | POA: Diagnosis not present

## 2019-06-21 DIAGNOSIS — J9611 Chronic respiratory failure with hypoxia: Secondary | ICD-10-CM | POA: Diagnosis not present

## 2019-06-21 DIAGNOSIS — I5032 Chronic diastolic (congestive) heart failure: Secondary | ICD-10-CM | POA: Diagnosis not present

## 2019-06-21 DIAGNOSIS — J449 Chronic obstructive pulmonary disease, unspecified: Secondary | ICD-10-CM | POA: Diagnosis not present

## 2019-06-21 DIAGNOSIS — E669 Obesity, unspecified: Secondary | ICD-10-CM | POA: Diagnosis not present

## 2019-06-21 DIAGNOSIS — Z86718 Personal history of other venous thrombosis and embolism: Secondary | ICD-10-CM | POA: Diagnosis not present

## 2019-06-21 DIAGNOSIS — Z6828 Body mass index (BMI) 28.0-28.9, adult: Secondary | ICD-10-CM | POA: Diagnosis not present

## 2019-06-22 DIAGNOSIS — I480 Paroxysmal atrial fibrillation: Secondary | ICD-10-CM | POA: Diagnosis not present

## 2019-06-22 DIAGNOSIS — J449 Chronic obstructive pulmonary disease, unspecified: Secondary | ICD-10-CM | POA: Diagnosis not present

## 2019-06-22 DIAGNOSIS — I2510 Atherosclerotic heart disease of native coronary artery without angina pectoris: Secondary | ICD-10-CM | POA: Diagnosis not present

## 2019-06-22 DIAGNOSIS — I110 Hypertensive heart disease with heart failure: Secondary | ICD-10-CM | POA: Diagnosis not present

## 2019-06-22 DIAGNOSIS — I5032 Chronic diastolic (congestive) heart failure: Secondary | ICD-10-CM | POA: Diagnosis not present

## 2019-06-22 DIAGNOSIS — J9611 Chronic respiratory failure with hypoxia: Secondary | ICD-10-CM | POA: Diagnosis not present

## 2019-06-29 DIAGNOSIS — I2510 Atherosclerotic heart disease of native coronary artery without angina pectoris: Secondary | ICD-10-CM | POA: Diagnosis not present

## 2019-06-29 DIAGNOSIS — I110 Hypertensive heart disease with heart failure: Secondary | ICD-10-CM | POA: Diagnosis not present

## 2019-06-29 DIAGNOSIS — J449 Chronic obstructive pulmonary disease, unspecified: Secondary | ICD-10-CM | POA: Diagnosis not present

## 2019-06-29 DIAGNOSIS — F0390 Unspecified dementia without behavioral disturbance: Secondary | ICD-10-CM | POA: Diagnosis not present

## 2019-06-29 DIAGNOSIS — J9611 Chronic respiratory failure with hypoxia: Secondary | ICD-10-CM | POA: Diagnosis not present

## 2019-06-29 DIAGNOSIS — I5032 Chronic diastolic (congestive) heart failure: Secondary | ICD-10-CM | POA: Diagnosis not present

## 2019-06-29 DIAGNOSIS — I480 Paroxysmal atrial fibrillation: Secondary | ICD-10-CM | POA: Diagnosis not present

## 2019-07-04 DIAGNOSIS — I350 Nonrheumatic aortic (valve) stenosis: Secondary | ICD-10-CM | POA: Diagnosis not present

## 2019-07-04 DIAGNOSIS — F0390 Unspecified dementia without behavioral disturbance: Secondary | ICD-10-CM | POA: Diagnosis not present

## 2019-07-04 DIAGNOSIS — R51 Headache: Secondary | ICD-10-CM | POA: Diagnosis not present

## 2019-07-04 DIAGNOSIS — I4891 Unspecified atrial fibrillation: Secondary | ICD-10-CM | POA: Diagnosis not present

## 2019-07-04 DIAGNOSIS — Z8673 Personal history of transient ischemic attack (TIA), and cerebral infarction without residual deficits: Secondary | ICD-10-CM | POA: Diagnosis not present

## 2019-07-05 DIAGNOSIS — I480 Paroxysmal atrial fibrillation: Secondary | ICD-10-CM | POA: Diagnosis not present

## 2019-07-05 DIAGNOSIS — J9611 Chronic respiratory failure with hypoxia: Secondary | ICD-10-CM | POA: Diagnosis not present

## 2019-07-05 DIAGNOSIS — I5032 Chronic diastolic (congestive) heart failure: Secondary | ICD-10-CM | POA: Diagnosis not present

## 2019-07-05 DIAGNOSIS — I2510 Atherosclerotic heart disease of native coronary artery without angina pectoris: Secondary | ICD-10-CM | POA: Diagnosis not present

## 2019-07-05 DIAGNOSIS — I110 Hypertensive heart disease with heart failure: Secondary | ICD-10-CM | POA: Diagnosis not present

## 2019-07-05 DIAGNOSIS — J449 Chronic obstructive pulmonary disease, unspecified: Secondary | ICD-10-CM | POA: Diagnosis not present

## 2019-07-08 ENCOUNTER — Other Ambulatory Visit: Payer: Self-pay

## 2019-07-13 DIAGNOSIS — E119 Type 2 diabetes mellitus without complications: Secondary | ICD-10-CM | POA: Diagnosis not present

## 2019-07-13 DIAGNOSIS — I129 Hypertensive chronic kidney disease with stage 1 through stage 4 chronic kidney disease, or unspecified chronic kidney disease: Secondary | ICD-10-CM | POA: Diagnosis not present

## 2019-07-13 DIAGNOSIS — F0390 Unspecified dementia without behavioral disturbance: Secondary | ICD-10-CM | POA: Diagnosis not present

## 2019-07-13 DIAGNOSIS — K219 Gastro-esophageal reflux disease without esophagitis: Secondary | ICD-10-CM | POA: Diagnosis not present

## 2019-07-13 DIAGNOSIS — E559 Vitamin D deficiency, unspecified: Secondary | ICD-10-CM | POA: Diagnosis not present

## 2019-07-13 DIAGNOSIS — G44209 Tension-type headache, unspecified, not intractable: Secondary | ICD-10-CM | POA: Diagnosis not present

## 2019-07-13 DIAGNOSIS — E785 Hyperlipidemia, unspecified: Secondary | ICD-10-CM | POA: Diagnosis not present

## 2019-07-13 DIAGNOSIS — I5022 Chronic systolic (congestive) heart failure: Secondary | ICD-10-CM | POA: Diagnosis not present

## 2019-07-13 DIAGNOSIS — N183 Chronic kidney disease, stage 3 (moderate): Secondary | ICD-10-CM | POA: Diagnosis not present

## 2019-07-13 DIAGNOSIS — I4891 Unspecified atrial fibrillation: Secondary | ICD-10-CM | POA: Diagnosis not present

## 2019-07-13 DIAGNOSIS — E039 Hypothyroidism, unspecified: Secondary | ICD-10-CM | POA: Diagnosis not present

## 2019-07-13 DIAGNOSIS — I350 Nonrheumatic aortic (valve) stenosis: Secondary | ICD-10-CM | POA: Diagnosis not present

## 2019-07-14 DIAGNOSIS — E559 Vitamin D deficiency, unspecified: Secondary | ICD-10-CM | POA: Diagnosis not present

## 2019-07-20 DIAGNOSIS — J449 Chronic obstructive pulmonary disease, unspecified: Secondary | ICD-10-CM | POA: Diagnosis not present

## 2019-07-20 DIAGNOSIS — I110 Hypertensive heart disease with heart failure: Secondary | ICD-10-CM | POA: Diagnosis not present

## 2019-07-20 DIAGNOSIS — I5032 Chronic diastolic (congestive) heart failure: Secondary | ICD-10-CM | POA: Diagnosis not present

## 2019-07-20 DIAGNOSIS — J9611 Chronic respiratory failure with hypoxia: Secondary | ICD-10-CM | POA: Diagnosis not present

## 2019-07-20 DIAGNOSIS — I2510 Atherosclerotic heart disease of native coronary artery without angina pectoris: Secondary | ICD-10-CM | POA: Diagnosis not present

## 2019-07-20 DIAGNOSIS — I480 Paroxysmal atrial fibrillation: Secondary | ICD-10-CM | POA: Diagnosis not present

## 2019-07-21 DIAGNOSIS — Z7982 Long term (current) use of aspirin: Secondary | ICD-10-CM | POA: Diagnosis not present

## 2019-07-21 DIAGNOSIS — K219 Gastro-esophageal reflux disease without esophagitis: Secondary | ICD-10-CM | POA: Diagnosis not present

## 2019-07-21 DIAGNOSIS — Z6828 Body mass index (BMI) 28.0-28.9, adult: Secondary | ICD-10-CM | POA: Diagnosis not present

## 2019-07-21 DIAGNOSIS — E876 Hypokalemia: Secondary | ICD-10-CM | POA: Diagnosis not present

## 2019-07-21 DIAGNOSIS — Z9981 Dependence on supplemental oxygen: Secondary | ICD-10-CM | POA: Diagnosis not present

## 2019-07-21 DIAGNOSIS — Z7902 Long term (current) use of antithrombotics/antiplatelets: Secondary | ICD-10-CM | POA: Diagnosis not present

## 2019-07-21 DIAGNOSIS — E785 Hyperlipidemia, unspecified: Secondary | ICD-10-CM | POA: Diagnosis not present

## 2019-07-21 DIAGNOSIS — J9611 Chronic respiratory failure with hypoxia: Secondary | ICD-10-CM | POA: Diagnosis not present

## 2019-07-21 DIAGNOSIS — M1991 Primary osteoarthritis, unspecified site: Secondary | ICD-10-CM | POA: Diagnosis not present

## 2019-07-21 DIAGNOSIS — E669 Obesity, unspecified: Secondary | ICD-10-CM | POA: Diagnosis not present

## 2019-07-21 DIAGNOSIS — G4733 Obstructive sleep apnea (adult) (pediatric): Secondary | ICD-10-CM | POA: Diagnosis not present

## 2019-07-21 DIAGNOSIS — J449 Chronic obstructive pulmonary disease, unspecified: Secondary | ICD-10-CM | POA: Diagnosis not present

## 2019-07-21 DIAGNOSIS — M810 Age-related osteoporosis without current pathological fracture: Secondary | ICD-10-CM | POA: Diagnosis not present

## 2019-07-21 DIAGNOSIS — Z86718 Personal history of other venous thrombosis and embolism: Secondary | ICD-10-CM | POA: Diagnosis not present

## 2019-07-21 DIAGNOSIS — Z86711 Personal history of pulmonary embolism: Secondary | ICD-10-CM | POA: Diagnosis not present

## 2019-07-21 DIAGNOSIS — I5032 Chronic diastolic (congestive) heart failure: Secondary | ICD-10-CM | POA: Diagnosis not present

## 2019-07-21 DIAGNOSIS — I480 Paroxysmal atrial fibrillation: Secondary | ICD-10-CM | POA: Diagnosis not present

## 2019-07-21 DIAGNOSIS — Z8673 Personal history of transient ischemic attack (TIA), and cerebral infarction without residual deficits: Secondary | ICD-10-CM | POA: Diagnosis not present

## 2019-07-21 DIAGNOSIS — I110 Hypertensive heart disease with heart failure: Secondary | ICD-10-CM | POA: Diagnosis not present

## 2019-07-21 DIAGNOSIS — I2510 Atherosclerotic heart disease of native coronary artery without angina pectoris: Secondary | ICD-10-CM | POA: Diagnosis not present

## 2019-07-26 DIAGNOSIS — F0390 Unspecified dementia without behavioral disturbance: Secondary | ICD-10-CM | POA: Diagnosis not present

## 2019-07-29 DIAGNOSIS — I480 Paroxysmal atrial fibrillation: Secondary | ICD-10-CM | POA: Diagnosis not present

## 2019-07-29 DIAGNOSIS — I110 Hypertensive heart disease with heart failure: Secondary | ICD-10-CM | POA: Diagnosis not present

## 2019-07-29 DIAGNOSIS — J9611 Chronic respiratory failure with hypoxia: Secondary | ICD-10-CM | POA: Diagnosis not present

## 2019-07-29 DIAGNOSIS — I5032 Chronic diastolic (congestive) heart failure: Secondary | ICD-10-CM | POA: Diagnosis not present

## 2019-07-29 DIAGNOSIS — J449 Chronic obstructive pulmonary disease, unspecified: Secondary | ICD-10-CM | POA: Diagnosis not present

## 2019-07-29 DIAGNOSIS — I2510 Atherosclerotic heart disease of native coronary artery without angina pectoris: Secondary | ICD-10-CM | POA: Diagnosis not present

## 2019-08-03 DIAGNOSIS — J9611 Chronic respiratory failure with hypoxia: Secondary | ICD-10-CM | POA: Diagnosis not present

## 2019-08-03 DIAGNOSIS — J449 Chronic obstructive pulmonary disease, unspecified: Secondary | ICD-10-CM | POA: Diagnosis not present

## 2019-08-03 DIAGNOSIS — I110 Hypertensive heart disease with heart failure: Secondary | ICD-10-CM | POA: Diagnosis not present

## 2019-08-03 DIAGNOSIS — I5032 Chronic diastolic (congestive) heart failure: Secondary | ICD-10-CM | POA: Diagnosis not present

## 2019-08-03 DIAGNOSIS — I2510 Atherosclerotic heart disease of native coronary artery without angina pectoris: Secondary | ICD-10-CM | POA: Diagnosis not present

## 2019-08-03 DIAGNOSIS — I480 Paroxysmal atrial fibrillation: Secondary | ICD-10-CM | POA: Diagnosis not present

## 2019-08-12 DIAGNOSIS — I480 Paroxysmal atrial fibrillation: Secondary | ICD-10-CM | POA: Diagnosis not present

## 2019-08-12 DIAGNOSIS — I110 Hypertensive heart disease with heart failure: Secondary | ICD-10-CM | POA: Diagnosis not present

## 2019-08-12 DIAGNOSIS — I5032 Chronic diastolic (congestive) heart failure: Secondary | ICD-10-CM | POA: Diagnosis not present

## 2019-08-12 DIAGNOSIS — J449 Chronic obstructive pulmonary disease, unspecified: Secondary | ICD-10-CM | POA: Diagnosis not present

## 2019-08-12 DIAGNOSIS — J9611 Chronic respiratory failure with hypoxia: Secondary | ICD-10-CM | POA: Diagnosis not present

## 2019-08-12 DIAGNOSIS — I2510 Atherosclerotic heart disease of native coronary artery without angina pectoris: Secondary | ICD-10-CM | POA: Diagnosis not present

## 2019-08-17 DIAGNOSIS — I5032 Chronic diastolic (congestive) heart failure: Secondary | ICD-10-CM | POA: Diagnosis not present

## 2019-08-17 DIAGNOSIS — I110 Hypertensive heart disease with heart failure: Secondary | ICD-10-CM | POA: Diagnosis not present

## 2019-08-17 DIAGNOSIS — I2510 Atherosclerotic heart disease of native coronary artery without angina pectoris: Secondary | ICD-10-CM | POA: Diagnosis not present

## 2019-08-17 DIAGNOSIS — J9611 Chronic respiratory failure with hypoxia: Secondary | ICD-10-CM | POA: Diagnosis not present

## 2019-08-17 DIAGNOSIS — I480 Paroxysmal atrial fibrillation: Secondary | ICD-10-CM | POA: Diagnosis not present

## 2019-08-17 DIAGNOSIS — J449 Chronic obstructive pulmonary disease, unspecified: Secondary | ICD-10-CM | POA: Diagnosis not present

## 2019-08-20 DIAGNOSIS — Z7982 Long term (current) use of aspirin: Secondary | ICD-10-CM | POA: Diagnosis not present

## 2019-08-20 DIAGNOSIS — Z86711 Personal history of pulmonary embolism: Secondary | ICD-10-CM | POA: Diagnosis not present

## 2019-08-20 DIAGNOSIS — E785 Hyperlipidemia, unspecified: Secondary | ICD-10-CM | POA: Diagnosis not present

## 2019-08-20 DIAGNOSIS — Z8673 Personal history of transient ischemic attack (TIA), and cerebral infarction without residual deficits: Secondary | ICD-10-CM | POA: Diagnosis not present

## 2019-08-20 DIAGNOSIS — E876 Hypokalemia: Secondary | ICD-10-CM | POA: Diagnosis not present

## 2019-08-20 DIAGNOSIS — I110 Hypertensive heart disease with heart failure: Secondary | ICD-10-CM | POA: Diagnosis not present

## 2019-08-20 DIAGNOSIS — M810 Age-related osteoporosis without current pathological fracture: Secondary | ICD-10-CM | POA: Diagnosis not present

## 2019-08-20 DIAGNOSIS — Z86718 Personal history of other venous thrombosis and embolism: Secondary | ICD-10-CM | POA: Diagnosis not present

## 2019-08-20 DIAGNOSIS — Z6828 Body mass index (BMI) 28.0-28.9, adult: Secondary | ICD-10-CM | POA: Diagnosis not present

## 2019-08-20 DIAGNOSIS — K219 Gastro-esophageal reflux disease without esophagitis: Secondary | ICD-10-CM | POA: Diagnosis not present

## 2019-08-20 DIAGNOSIS — Z9981 Dependence on supplemental oxygen: Secondary | ICD-10-CM | POA: Diagnosis not present

## 2019-08-20 DIAGNOSIS — G4733 Obstructive sleep apnea (adult) (pediatric): Secondary | ICD-10-CM | POA: Diagnosis not present

## 2019-08-20 DIAGNOSIS — Z7902 Long term (current) use of antithrombotics/antiplatelets: Secondary | ICD-10-CM | POA: Diagnosis not present

## 2019-08-20 DIAGNOSIS — I480 Paroxysmal atrial fibrillation: Secondary | ICD-10-CM | POA: Diagnosis not present

## 2019-08-20 DIAGNOSIS — I2510 Atherosclerotic heart disease of native coronary artery without angina pectoris: Secondary | ICD-10-CM | POA: Diagnosis not present

## 2019-08-20 DIAGNOSIS — M1991 Primary osteoarthritis, unspecified site: Secondary | ICD-10-CM | POA: Diagnosis not present

## 2019-08-20 DIAGNOSIS — I5032 Chronic diastolic (congestive) heart failure: Secondary | ICD-10-CM | POA: Diagnosis not present

## 2019-08-20 DIAGNOSIS — J9611 Chronic respiratory failure with hypoxia: Secondary | ICD-10-CM | POA: Diagnosis not present

## 2019-08-20 DIAGNOSIS — E669 Obesity, unspecified: Secondary | ICD-10-CM | POA: Diagnosis not present

## 2019-08-20 DIAGNOSIS — J449 Chronic obstructive pulmonary disease, unspecified: Secondary | ICD-10-CM | POA: Diagnosis not present

## 2019-08-23 DIAGNOSIS — J449 Chronic obstructive pulmonary disease, unspecified: Secondary | ICD-10-CM | POA: Diagnosis not present

## 2019-08-23 DIAGNOSIS — I110 Hypertensive heart disease with heart failure: Secondary | ICD-10-CM | POA: Diagnosis not present

## 2019-08-23 DIAGNOSIS — I2510 Atherosclerotic heart disease of native coronary artery without angina pectoris: Secondary | ICD-10-CM | POA: Diagnosis not present

## 2019-08-23 DIAGNOSIS — J9611 Chronic respiratory failure with hypoxia: Secondary | ICD-10-CM | POA: Diagnosis not present

## 2019-08-23 DIAGNOSIS — I5032 Chronic diastolic (congestive) heart failure: Secondary | ICD-10-CM | POA: Diagnosis not present

## 2019-08-23 DIAGNOSIS — I480 Paroxysmal atrial fibrillation: Secondary | ICD-10-CM | POA: Diagnosis not present

## 2019-08-25 DIAGNOSIS — Z23 Encounter for immunization: Secondary | ICD-10-CM | POA: Diagnosis not present

## 2019-08-29 DIAGNOSIS — J449 Chronic obstructive pulmonary disease, unspecified: Secondary | ICD-10-CM | POA: Diagnosis not present

## 2019-08-29 DIAGNOSIS — I480 Paroxysmal atrial fibrillation: Secondary | ICD-10-CM | POA: Diagnosis not present

## 2019-08-29 DIAGNOSIS — J9611 Chronic respiratory failure with hypoxia: Secondary | ICD-10-CM | POA: Diagnosis not present

## 2019-08-29 DIAGNOSIS — I2510 Atherosclerotic heart disease of native coronary artery without angina pectoris: Secondary | ICD-10-CM | POA: Diagnosis not present

## 2019-08-29 DIAGNOSIS — I5032 Chronic diastolic (congestive) heart failure: Secondary | ICD-10-CM | POA: Diagnosis not present

## 2019-08-29 DIAGNOSIS — I110 Hypertensive heart disease with heart failure: Secondary | ICD-10-CM | POA: Diagnosis not present

## 2019-08-31 DIAGNOSIS — F0390 Unspecified dementia without behavioral disturbance: Secondary | ICD-10-CM | POA: Diagnosis not present

## 2019-09-05 DIAGNOSIS — J449 Chronic obstructive pulmonary disease, unspecified: Secondary | ICD-10-CM | POA: Diagnosis not present

## 2019-09-05 DIAGNOSIS — I5032 Chronic diastolic (congestive) heart failure: Secondary | ICD-10-CM | POA: Diagnosis not present

## 2019-09-05 DIAGNOSIS — I110 Hypertensive heart disease with heart failure: Secondary | ICD-10-CM | POA: Diagnosis not present

## 2019-09-05 DIAGNOSIS — J9611 Chronic respiratory failure with hypoxia: Secondary | ICD-10-CM | POA: Diagnosis not present

## 2019-09-05 DIAGNOSIS — I480 Paroxysmal atrial fibrillation: Secondary | ICD-10-CM | POA: Diagnosis not present

## 2019-09-05 DIAGNOSIS — I2510 Atherosclerotic heart disease of native coronary artery without angina pectoris: Secondary | ICD-10-CM | POA: Diagnosis not present

## 2019-09-13 DIAGNOSIS — I480 Paroxysmal atrial fibrillation: Secondary | ICD-10-CM | POA: Diagnosis not present

## 2019-09-13 DIAGNOSIS — I110 Hypertensive heart disease with heart failure: Secondary | ICD-10-CM | POA: Diagnosis not present

## 2019-09-13 DIAGNOSIS — J9611 Chronic respiratory failure with hypoxia: Secondary | ICD-10-CM | POA: Diagnosis not present

## 2019-09-13 DIAGNOSIS — I5032 Chronic diastolic (congestive) heart failure: Secondary | ICD-10-CM | POA: Diagnosis not present

## 2019-09-13 DIAGNOSIS — I2510 Atherosclerotic heart disease of native coronary artery without angina pectoris: Secondary | ICD-10-CM | POA: Diagnosis not present

## 2019-09-13 DIAGNOSIS — J449 Chronic obstructive pulmonary disease, unspecified: Secondary | ICD-10-CM | POA: Diagnosis not present

## 2019-09-19 DIAGNOSIS — Z6828 Body mass index (BMI) 28.0-28.9, adult: Secondary | ICD-10-CM | POA: Diagnosis not present

## 2019-09-19 DIAGNOSIS — Z86718 Personal history of other venous thrombosis and embolism: Secondary | ICD-10-CM | POA: Diagnosis not present

## 2019-09-19 DIAGNOSIS — I110 Hypertensive heart disease with heart failure: Secondary | ICD-10-CM | POA: Diagnosis not present

## 2019-09-19 DIAGNOSIS — M810 Age-related osteoporosis without current pathological fracture: Secondary | ICD-10-CM | POA: Diagnosis not present

## 2019-09-19 DIAGNOSIS — Z8673 Personal history of transient ischemic attack (TIA), and cerebral infarction without residual deficits: Secondary | ICD-10-CM | POA: Diagnosis not present

## 2019-09-19 DIAGNOSIS — Z7902 Long term (current) use of antithrombotics/antiplatelets: Secondary | ICD-10-CM | POA: Diagnosis not present

## 2019-09-19 DIAGNOSIS — G4733 Obstructive sleep apnea (adult) (pediatric): Secondary | ICD-10-CM | POA: Diagnosis not present

## 2019-09-19 DIAGNOSIS — J449 Chronic obstructive pulmonary disease, unspecified: Secondary | ICD-10-CM | POA: Diagnosis not present

## 2019-09-19 DIAGNOSIS — E876 Hypokalemia: Secondary | ICD-10-CM | POA: Diagnosis not present

## 2019-09-19 DIAGNOSIS — J9611 Chronic respiratory failure with hypoxia: Secondary | ICD-10-CM | POA: Diagnosis not present

## 2019-09-19 DIAGNOSIS — Z9981 Dependence on supplemental oxygen: Secondary | ICD-10-CM | POA: Diagnosis not present

## 2019-09-19 DIAGNOSIS — I2510 Atherosclerotic heart disease of native coronary artery without angina pectoris: Secondary | ICD-10-CM | POA: Diagnosis not present

## 2019-09-19 DIAGNOSIS — Z86711 Personal history of pulmonary embolism: Secondary | ICD-10-CM | POA: Diagnosis not present

## 2019-09-19 DIAGNOSIS — E669 Obesity, unspecified: Secondary | ICD-10-CM | POA: Diagnosis not present

## 2019-09-19 DIAGNOSIS — E785 Hyperlipidemia, unspecified: Secondary | ICD-10-CM | POA: Diagnosis not present

## 2019-09-19 DIAGNOSIS — I480 Paroxysmal atrial fibrillation: Secondary | ICD-10-CM | POA: Diagnosis not present

## 2019-09-19 DIAGNOSIS — I5032 Chronic diastolic (congestive) heart failure: Secondary | ICD-10-CM | POA: Diagnosis not present

## 2019-09-19 DIAGNOSIS — K219 Gastro-esophageal reflux disease without esophagitis: Secondary | ICD-10-CM | POA: Diagnosis not present

## 2019-09-19 DIAGNOSIS — M1991 Primary osteoarthritis, unspecified site: Secondary | ICD-10-CM | POA: Diagnosis not present

## 2019-09-19 DIAGNOSIS — Z7982 Long term (current) use of aspirin: Secondary | ICD-10-CM | POA: Diagnosis not present

## 2019-09-20 DIAGNOSIS — J449 Chronic obstructive pulmonary disease, unspecified: Secondary | ICD-10-CM | POA: Diagnosis not present

## 2019-09-20 DIAGNOSIS — I5032 Chronic diastolic (congestive) heart failure: Secondary | ICD-10-CM | POA: Diagnosis not present

## 2019-09-20 DIAGNOSIS — I2510 Atherosclerotic heart disease of native coronary artery without angina pectoris: Secondary | ICD-10-CM | POA: Diagnosis not present

## 2019-09-20 DIAGNOSIS — I480 Paroxysmal atrial fibrillation: Secondary | ICD-10-CM | POA: Diagnosis not present

## 2019-09-20 DIAGNOSIS — I110 Hypertensive heart disease with heart failure: Secondary | ICD-10-CM | POA: Diagnosis not present

## 2019-09-20 DIAGNOSIS — J9611 Chronic respiratory failure with hypoxia: Secondary | ICD-10-CM | POA: Diagnosis not present

## 2019-09-23 DIAGNOSIS — R269 Unspecified abnormalities of gait and mobility: Secondary | ICD-10-CM | POA: Diagnosis not present

## 2019-09-23 DIAGNOSIS — M79672 Pain in left foot: Secondary | ICD-10-CM | POA: Diagnosis not present

## 2019-09-23 DIAGNOSIS — R600 Localized edema: Secondary | ICD-10-CM | POA: Diagnosis not present

## 2019-09-23 DIAGNOSIS — M6281 Muscle weakness (generalized): Secondary | ICD-10-CM | POA: Diagnosis not present

## 2019-09-23 DIAGNOSIS — F0390 Unspecified dementia without behavioral disturbance: Secondary | ICD-10-CM | POA: Diagnosis not present

## 2019-09-29 DIAGNOSIS — J9611 Chronic respiratory failure with hypoxia: Secondary | ICD-10-CM | POA: Diagnosis not present

## 2019-09-29 DIAGNOSIS — I480 Paroxysmal atrial fibrillation: Secondary | ICD-10-CM | POA: Diagnosis not present

## 2019-09-29 DIAGNOSIS — I2510 Atherosclerotic heart disease of native coronary artery without angina pectoris: Secondary | ICD-10-CM | POA: Diagnosis not present

## 2019-09-29 DIAGNOSIS — I5032 Chronic diastolic (congestive) heart failure: Secondary | ICD-10-CM | POA: Diagnosis not present

## 2019-09-29 DIAGNOSIS — I110 Hypertensive heart disease with heart failure: Secondary | ICD-10-CM | POA: Diagnosis not present

## 2019-09-29 DIAGNOSIS — J449 Chronic obstructive pulmonary disease, unspecified: Secondary | ICD-10-CM | POA: Diagnosis not present

## 2019-10-03 DIAGNOSIS — J9611 Chronic respiratory failure with hypoxia: Secondary | ICD-10-CM | POA: Diagnosis not present

## 2019-10-03 DIAGNOSIS — I5032 Chronic diastolic (congestive) heart failure: Secondary | ICD-10-CM | POA: Diagnosis not present

## 2019-10-03 DIAGNOSIS — I2510 Atherosclerotic heart disease of native coronary artery without angina pectoris: Secondary | ICD-10-CM | POA: Diagnosis not present

## 2019-10-03 DIAGNOSIS — I480 Paroxysmal atrial fibrillation: Secondary | ICD-10-CM | POA: Diagnosis not present

## 2019-10-03 DIAGNOSIS — J449 Chronic obstructive pulmonary disease, unspecified: Secondary | ICD-10-CM | POA: Diagnosis not present

## 2019-10-03 DIAGNOSIS — I110 Hypertensive heart disease with heart failure: Secondary | ICD-10-CM | POA: Diagnosis not present

## 2019-10-12 DIAGNOSIS — M79641 Pain in right hand: Secondary | ICD-10-CM | POA: Diagnosis not present

## 2019-10-12 DIAGNOSIS — F0390 Unspecified dementia without behavioral disturbance: Secondary | ICD-10-CM | POA: Diagnosis not present

## 2019-10-12 DIAGNOSIS — M25531 Pain in right wrist: Secondary | ICD-10-CM | POA: Diagnosis not present

## 2019-10-17 DIAGNOSIS — J9611 Chronic respiratory failure with hypoxia: Secondary | ICD-10-CM | POA: Diagnosis not present

## 2019-10-17 DIAGNOSIS — I2510 Atherosclerotic heart disease of native coronary artery without angina pectoris: Secondary | ICD-10-CM | POA: Diagnosis not present

## 2019-10-17 DIAGNOSIS — J449 Chronic obstructive pulmonary disease, unspecified: Secondary | ICD-10-CM | POA: Diagnosis not present

## 2019-10-17 DIAGNOSIS — I110 Hypertensive heart disease with heart failure: Secondary | ICD-10-CM | POA: Diagnosis not present

## 2019-10-17 DIAGNOSIS — I5032 Chronic diastolic (congestive) heart failure: Secondary | ICD-10-CM | POA: Diagnosis not present

## 2019-10-17 DIAGNOSIS — I480 Paroxysmal atrial fibrillation: Secondary | ICD-10-CM | POA: Diagnosis not present

## 2019-10-19 DIAGNOSIS — Z86711 Personal history of pulmonary embolism: Secondary | ICD-10-CM | POA: Diagnosis not present

## 2019-10-19 DIAGNOSIS — Z9981 Dependence on supplemental oxygen: Secondary | ICD-10-CM | POA: Diagnosis not present

## 2019-10-19 DIAGNOSIS — M810 Age-related osteoporosis without current pathological fracture: Secondary | ICD-10-CM | POA: Diagnosis not present

## 2019-10-19 DIAGNOSIS — Z7982 Long term (current) use of aspirin: Secondary | ICD-10-CM | POA: Diagnosis not present

## 2019-10-19 DIAGNOSIS — E876 Hypokalemia: Secondary | ICD-10-CM | POA: Diagnosis not present

## 2019-10-19 DIAGNOSIS — Z20828 Contact with and (suspected) exposure to other viral communicable diseases: Secondary | ICD-10-CM | POA: Diagnosis not present

## 2019-10-19 DIAGNOSIS — J9611 Chronic respiratory failure with hypoxia: Secondary | ICD-10-CM | POA: Diagnosis not present

## 2019-10-19 DIAGNOSIS — I480 Paroxysmal atrial fibrillation: Secondary | ICD-10-CM | POA: Diagnosis not present

## 2019-10-19 DIAGNOSIS — J449 Chronic obstructive pulmonary disease, unspecified: Secondary | ICD-10-CM | POA: Diagnosis not present

## 2019-10-19 DIAGNOSIS — I5032 Chronic diastolic (congestive) heart failure: Secondary | ICD-10-CM | POA: Diagnosis not present

## 2019-10-19 DIAGNOSIS — E669 Obesity, unspecified: Secondary | ICD-10-CM | POA: Diagnosis not present

## 2019-10-19 DIAGNOSIS — Z8673 Personal history of transient ischemic attack (TIA), and cerebral infarction without residual deficits: Secondary | ICD-10-CM | POA: Diagnosis not present

## 2019-10-19 DIAGNOSIS — R54 Age-related physical debility: Secondary | ICD-10-CM | POA: Diagnosis not present

## 2019-10-19 DIAGNOSIS — K219 Gastro-esophageal reflux disease without esophagitis: Secondary | ICD-10-CM | POA: Diagnosis not present

## 2019-10-19 DIAGNOSIS — E785 Hyperlipidemia, unspecified: Secondary | ICD-10-CM | POA: Diagnosis not present

## 2019-10-19 DIAGNOSIS — I2510 Atherosclerotic heart disease of native coronary artery without angina pectoris: Secondary | ICD-10-CM | POA: Diagnosis not present

## 2019-10-19 DIAGNOSIS — Z6828 Body mass index (BMI) 28.0-28.9, adult: Secondary | ICD-10-CM | POA: Diagnosis not present

## 2019-10-19 DIAGNOSIS — I110 Hypertensive heart disease with heart failure: Secondary | ICD-10-CM | POA: Diagnosis not present

## 2019-10-19 DIAGNOSIS — G4733 Obstructive sleep apnea (adult) (pediatric): Secondary | ICD-10-CM | POA: Diagnosis not present

## 2019-10-19 DIAGNOSIS — Z86718 Personal history of other venous thrombosis and embolism: Secondary | ICD-10-CM | POA: Diagnosis not present

## 2019-10-19 DIAGNOSIS — M1991 Primary osteoarthritis, unspecified site: Secondary | ICD-10-CM | POA: Diagnosis not present

## 2019-10-19 DIAGNOSIS — Z7902 Long term (current) use of antithrombotics/antiplatelets: Secondary | ICD-10-CM | POA: Diagnosis not present

## 2019-10-24 DIAGNOSIS — J449 Chronic obstructive pulmonary disease, unspecified: Secondary | ICD-10-CM | POA: Diagnosis not present

## 2019-10-24 DIAGNOSIS — I5032 Chronic diastolic (congestive) heart failure: Secondary | ICD-10-CM | POA: Diagnosis not present

## 2019-10-24 DIAGNOSIS — I2510 Atherosclerotic heart disease of native coronary artery without angina pectoris: Secondary | ICD-10-CM | POA: Diagnosis not present

## 2019-10-24 DIAGNOSIS — J9611 Chronic respiratory failure with hypoxia: Secondary | ICD-10-CM | POA: Diagnosis not present

## 2019-10-24 DIAGNOSIS — I480 Paroxysmal atrial fibrillation: Secondary | ICD-10-CM | POA: Diagnosis not present

## 2019-10-24 DIAGNOSIS — I110 Hypertensive heart disease with heart failure: Secondary | ICD-10-CM | POA: Diagnosis not present

## 2019-10-26 DIAGNOSIS — Z20828 Contact with and (suspected) exposure to other viral communicable diseases: Secondary | ICD-10-CM | POA: Diagnosis not present

## 2019-10-26 DIAGNOSIS — R54 Age-related physical debility: Secondary | ICD-10-CM | POA: Diagnosis not present

## 2019-11-02 DIAGNOSIS — Z20828 Contact with and (suspected) exposure to other viral communicable diseases: Secondary | ICD-10-CM | POA: Diagnosis not present

## 2019-11-16 DIAGNOSIS — Z20828 Contact with and (suspected) exposure to other viral communicable diseases: Secondary | ICD-10-CM | POA: Diagnosis not present

## 2019-11-18 DIAGNOSIS — Z8673 Personal history of transient ischemic attack (TIA), and cerebral infarction without residual deficits: Secondary | ICD-10-CM | POA: Diagnosis not present

## 2019-11-18 DIAGNOSIS — K219 Gastro-esophageal reflux disease without esophagitis: Secondary | ICD-10-CM | POA: Diagnosis not present

## 2019-11-18 DIAGNOSIS — E785 Hyperlipidemia, unspecified: Secondary | ICD-10-CM | POA: Diagnosis not present

## 2019-11-18 DIAGNOSIS — Z9981 Dependence on supplemental oxygen: Secondary | ICD-10-CM | POA: Diagnosis not present

## 2019-11-18 DIAGNOSIS — I5032 Chronic diastolic (congestive) heart failure: Secondary | ICD-10-CM | POA: Diagnosis not present

## 2019-11-18 DIAGNOSIS — Z7902 Long term (current) use of antithrombotics/antiplatelets: Secondary | ICD-10-CM | POA: Diagnosis not present

## 2019-11-18 DIAGNOSIS — I480 Paroxysmal atrial fibrillation: Secondary | ICD-10-CM | POA: Diagnosis not present

## 2019-11-18 DIAGNOSIS — E876 Hypokalemia: Secondary | ICD-10-CM | POA: Diagnosis not present

## 2019-11-18 DIAGNOSIS — J9611 Chronic respiratory failure with hypoxia: Secondary | ICD-10-CM | POA: Diagnosis not present

## 2019-11-18 DIAGNOSIS — E669 Obesity, unspecified: Secondary | ICD-10-CM | POA: Diagnosis not present

## 2019-11-18 DIAGNOSIS — I2510 Atherosclerotic heart disease of native coronary artery without angina pectoris: Secondary | ICD-10-CM | POA: Diagnosis not present

## 2019-11-18 DIAGNOSIS — J449 Chronic obstructive pulmonary disease, unspecified: Secondary | ICD-10-CM | POA: Diagnosis not present

## 2019-11-18 DIAGNOSIS — M810 Age-related osteoporosis without current pathological fracture: Secondary | ICD-10-CM | POA: Diagnosis not present

## 2019-11-18 DIAGNOSIS — Z7982 Long term (current) use of aspirin: Secondary | ICD-10-CM | POA: Diagnosis not present

## 2019-11-18 DIAGNOSIS — Z86711 Personal history of pulmonary embolism: Secondary | ICD-10-CM | POA: Diagnosis not present

## 2019-11-18 DIAGNOSIS — G4733 Obstructive sleep apnea (adult) (pediatric): Secondary | ICD-10-CM | POA: Diagnosis not present

## 2019-11-18 DIAGNOSIS — Z6828 Body mass index (BMI) 28.0-28.9, adult: Secondary | ICD-10-CM | POA: Diagnosis not present

## 2019-11-18 DIAGNOSIS — I110 Hypertensive heart disease with heart failure: Secondary | ICD-10-CM | POA: Diagnosis not present

## 2019-11-18 DIAGNOSIS — M1991 Primary osteoarthritis, unspecified site: Secondary | ICD-10-CM | POA: Diagnosis not present

## 2019-11-18 DIAGNOSIS — Z86718 Personal history of other venous thrombosis and embolism: Secondary | ICD-10-CM | POA: Diagnosis not present

## 2019-11-21 DIAGNOSIS — I480 Paroxysmal atrial fibrillation: Secondary | ICD-10-CM | POA: Diagnosis not present

## 2019-11-21 DIAGNOSIS — J449 Chronic obstructive pulmonary disease, unspecified: Secondary | ICD-10-CM | POA: Diagnosis not present

## 2019-11-21 DIAGNOSIS — I5032 Chronic diastolic (congestive) heart failure: Secondary | ICD-10-CM | POA: Diagnosis not present

## 2019-11-21 DIAGNOSIS — I110 Hypertensive heart disease with heart failure: Secondary | ICD-10-CM | POA: Diagnosis not present

## 2019-11-21 DIAGNOSIS — I2510 Atherosclerotic heart disease of native coronary artery without angina pectoris: Secondary | ICD-10-CM | POA: Diagnosis not present

## 2019-11-21 DIAGNOSIS — J9611 Chronic respiratory failure with hypoxia: Secondary | ICD-10-CM | POA: Diagnosis not present

## 2019-11-23 DIAGNOSIS — Z20828 Contact with and (suspected) exposure to other viral communicable diseases: Secondary | ICD-10-CM | POA: Diagnosis not present

## 2019-11-23 DIAGNOSIS — R54 Age-related physical debility: Secondary | ICD-10-CM | POA: Diagnosis not present

## 2019-11-28 DIAGNOSIS — I110 Hypertensive heart disease with heart failure: Secondary | ICD-10-CM | POA: Diagnosis not present

## 2019-11-28 DIAGNOSIS — I2510 Atherosclerotic heart disease of native coronary artery without angina pectoris: Secondary | ICD-10-CM | POA: Diagnosis not present

## 2019-11-28 DIAGNOSIS — I480 Paroxysmal atrial fibrillation: Secondary | ICD-10-CM | POA: Diagnosis not present

## 2019-11-28 DIAGNOSIS — J449 Chronic obstructive pulmonary disease, unspecified: Secondary | ICD-10-CM | POA: Diagnosis not present

## 2019-11-28 DIAGNOSIS — J9611 Chronic respiratory failure with hypoxia: Secondary | ICD-10-CM | POA: Diagnosis not present

## 2019-11-28 DIAGNOSIS — I5032 Chronic diastolic (congestive) heart failure: Secondary | ICD-10-CM | POA: Diagnosis not present

## 2019-11-30 DIAGNOSIS — R54 Age-related physical debility: Secondary | ICD-10-CM | POA: Diagnosis not present

## 2019-11-30 DIAGNOSIS — Z20828 Contact with and (suspected) exposure to other viral communicable diseases: Secondary | ICD-10-CM | POA: Diagnosis not present

## 2019-12-05 DIAGNOSIS — I5032 Chronic diastolic (congestive) heart failure: Secondary | ICD-10-CM | POA: Diagnosis not present

## 2019-12-05 DIAGNOSIS — I2510 Atherosclerotic heart disease of native coronary artery without angina pectoris: Secondary | ICD-10-CM | POA: Diagnosis not present

## 2019-12-05 DIAGNOSIS — J9611 Chronic respiratory failure with hypoxia: Secondary | ICD-10-CM | POA: Diagnosis not present

## 2019-12-05 DIAGNOSIS — I110 Hypertensive heart disease with heart failure: Secondary | ICD-10-CM | POA: Diagnosis not present

## 2019-12-05 DIAGNOSIS — I480 Paroxysmal atrial fibrillation: Secondary | ICD-10-CM | POA: Diagnosis not present

## 2019-12-05 DIAGNOSIS — J449 Chronic obstructive pulmonary disease, unspecified: Secondary | ICD-10-CM | POA: Diagnosis not present

## 2019-12-07 DIAGNOSIS — R54 Age-related physical debility: Secondary | ICD-10-CM | POA: Diagnosis not present

## 2019-12-07 DIAGNOSIS — Z20828 Contact with and (suspected) exposure to other viral communicable diseases: Secondary | ICD-10-CM | POA: Diagnosis not present

## 2019-12-14 DIAGNOSIS — R54 Age-related physical debility: Secondary | ICD-10-CM | POA: Diagnosis not present

## 2019-12-14 DIAGNOSIS — I110 Hypertensive heart disease with heart failure: Secondary | ICD-10-CM | POA: Diagnosis not present

## 2019-12-14 DIAGNOSIS — Z20828 Contact with and (suspected) exposure to other viral communicable diseases: Secondary | ICD-10-CM | POA: Diagnosis not present

## 2019-12-14 DIAGNOSIS — I2510 Atherosclerotic heart disease of native coronary artery without angina pectoris: Secondary | ICD-10-CM | POA: Diagnosis not present

## 2019-12-14 DIAGNOSIS — I5032 Chronic diastolic (congestive) heart failure: Secondary | ICD-10-CM | POA: Diagnosis not present

## 2019-12-14 DIAGNOSIS — I480 Paroxysmal atrial fibrillation: Secondary | ICD-10-CM | POA: Diagnosis not present

## 2019-12-14 DIAGNOSIS — J9611 Chronic respiratory failure with hypoxia: Secondary | ICD-10-CM | POA: Diagnosis not present

## 2019-12-14 DIAGNOSIS — J449 Chronic obstructive pulmonary disease, unspecified: Secondary | ICD-10-CM | POA: Diagnosis not present

## 2019-12-18 DIAGNOSIS — J449 Chronic obstructive pulmonary disease, unspecified: Secondary | ICD-10-CM | POA: Diagnosis not present

## 2019-12-18 DIAGNOSIS — I110 Hypertensive heart disease with heart failure: Secondary | ICD-10-CM | POA: Diagnosis not present

## 2019-12-18 DIAGNOSIS — K219 Gastro-esophageal reflux disease without esophagitis: Secondary | ICD-10-CM | POA: Diagnosis not present

## 2019-12-18 DIAGNOSIS — E876 Hypokalemia: Secondary | ICD-10-CM | POA: Diagnosis not present

## 2019-12-18 DIAGNOSIS — Z86711 Personal history of pulmonary embolism: Secondary | ICD-10-CM | POA: Diagnosis not present

## 2019-12-18 DIAGNOSIS — M1991 Primary osteoarthritis, unspecified site: Secondary | ICD-10-CM | POA: Diagnosis not present

## 2019-12-18 DIAGNOSIS — Z8673 Personal history of transient ischemic attack (TIA), and cerebral infarction without residual deficits: Secondary | ICD-10-CM | POA: Diagnosis not present

## 2019-12-18 DIAGNOSIS — Z86718 Personal history of other venous thrombosis and embolism: Secondary | ICD-10-CM | POA: Diagnosis not present

## 2019-12-18 DIAGNOSIS — Z9981 Dependence on supplemental oxygen: Secondary | ICD-10-CM | POA: Diagnosis not present

## 2019-12-18 DIAGNOSIS — M810 Age-related osteoporosis without current pathological fracture: Secondary | ICD-10-CM | POA: Diagnosis not present

## 2019-12-18 DIAGNOSIS — I480 Paroxysmal atrial fibrillation: Secondary | ICD-10-CM | POA: Diagnosis not present

## 2019-12-18 DIAGNOSIS — J9611 Chronic respiratory failure with hypoxia: Secondary | ICD-10-CM | POA: Diagnosis not present

## 2019-12-18 DIAGNOSIS — Z6828 Body mass index (BMI) 28.0-28.9, adult: Secondary | ICD-10-CM | POA: Diagnosis not present

## 2019-12-18 DIAGNOSIS — I2510 Atherosclerotic heart disease of native coronary artery without angina pectoris: Secondary | ICD-10-CM | POA: Diagnosis not present

## 2019-12-18 DIAGNOSIS — Z7902 Long term (current) use of antithrombotics/antiplatelets: Secondary | ICD-10-CM | POA: Diagnosis not present

## 2019-12-18 DIAGNOSIS — I5032 Chronic diastolic (congestive) heart failure: Secondary | ICD-10-CM | POA: Diagnosis not present

## 2019-12-18 DIAGNOSIS — E785 Hyperlipidemia, unspecified: Secondary | ICD-10-CM | POA: Diagnosis not present

## 2019-12-18 DIAGNOSIS — E669 Obesity, unspecified: Secondary | ICD-10-CM | POA: Diagnosis not present

## 2019-12-18 DIAGNOSIS — G4733 Obstructive sleep apnea (adult) (pediatric): Secondary | ICD-10-CM | POA: Diagnosis not present

## 2019-12-18 DIAGNOSIS — Z7982 Long term (current) use of aspirin: Secondary | ICD-10-CM | POA: Diagnosis not present

## 2019-12-20 DIAGNOSIS — I110 Hypertensive heart disease with heart failure: Secondary | ICD-10-CM | POA: Diagnosis not present

## 2019-12-20 DIAGNOSIS — J9611 Chronic respiratory failure with hypoxia: Secondary | ICD-10-CM | POA: Diagnosis not present

## 2019-12-20 DIAGNOSIS — I2510 Atherosclerotic heart disease of native coronary artery without angina pectoris: Secondary | ICD-10-CM | POA: Diagnosis not present

## 2019-12-20 DIAGNOSIS — J449 Chronic obstructive pulmonary disease, unspecified: Secondary | ICD-10-CM | POA: Diagnosis not present

## 2019-12-20 DIAGNOSIS — I5032 Chronic diastolic (congestive) heart failure: Secondary | ICD-10-CM | POA: Diagnosis not present

## 2019-12-20 DIAGNOSIS — I480 Paroxysmal atrial fibrillation: Secondary | ICD-10-CM | POA: Diagnosis not present

## 2019-12-21 DIAGNOSIS — R54 Age-related physical debility: Secondary | ICD-10-CM | POA: Diagnosis not present

## 2019-12-21 DIAGNOSIS — Z20828 Contact with and (suspected) exposure to other viral communicable diseases: Secondary | ICD-10-CM | POA: Diagnosis not present

## 2019-12-26 DIAGNOSIS — I480 Paroxysmal atrial fibrillation: Secondary | ICD-10-CM | POA: Diagnosis not present

## 2019-12-26 DIAGNOSIS — I5032 Chronic diastolic (congestive) heart failure: Secondary | ICD-10-CM | POA: Diagnosis not present

## 2019-12-26 DIAGNOSIS — J449 Chronic obstructive pulmonary disease, unspecified: Secondary | ICD-10-CM | POA: Diagnosis not present

## 2019-12-26 DIAGNOSIS — I110 Hypertensive heart disease with heart failure: Secondary | ICD-10-CM | POA: Diagnosis not present

## 2019-12-26 DIAGNOSIS — I2510 Atherosclerotic heart disease of native coronary artery without angina pectoris: Secondary | ICD-10-CM | POA: Diagnosis not present

## 2019-12-26 DIAGNOSIS — J9611 Chronic respiratory failure with hypoxia: Secondary | ICD-10-CM | POA: Diagnosis not present

## 2019-12-28 DIAGNOSIS — Z20828 Contact with and (suspected) exposure to other viral communicable diseases: Secondary | ICD-10-CM | POA: Diagnosis not present

## 2019-12-28 DIAGNOSIS — Z20822 Contact with and (suspected) exposure to covid-19: Secondary | ICD-10-CM | POA: Diagnosis not present

## 2019-12-28 DIAGNOSIS — R54 Age-related physical debility: Secondary | ICD-10-CM | POA: Diagnosis not present

## 2019-12-29 DIAGNOSIS — Z23 Encounter for immunization: Secondary | ICD-10-CM | POA: Diagnosis not present

## 2019-12-29 IMAGING — MR MR HEAD WO/W CM
11 of 13 series · 40 of 48 positions shown · IV contrast (gadavist)
Comparison: Head CT without contrast 08/13/2018 and earlier. Brain
MRI 06/01/2008.

CLINICAL DATA: [AGE] female with hypertension, 202 systolic.
Mild headache and chest pain.

EXAM:
MRI HEAD WITHOUT AND WITH CONTRAST
TECHNIQUE: Multiplanar, multiecho pulse sequences of the brain and surrounding
structures were obtained without and with intravenous contrast.
CONTRAST:  7.5 milliliters Gadavist

[Series 5: DWI · axial · 3.0mm · 0.88mm/px · z∈[-2,+126]mm · 9 of 90 slices shown (1 of 4)]
[im 1/90]
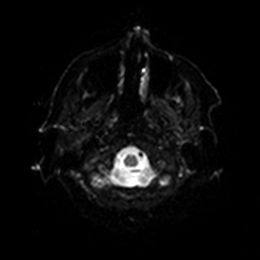
[im 12/90]
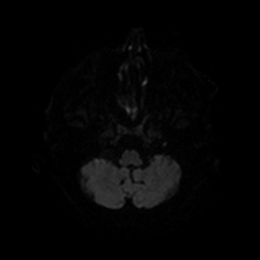
[im 23/90]
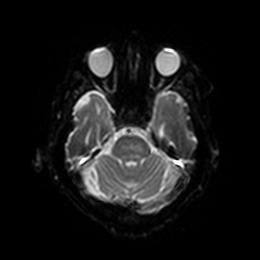
[im 34/90]
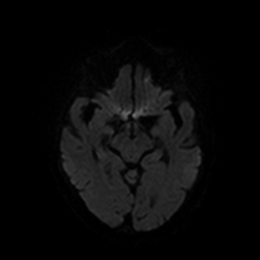
[im 45/90]
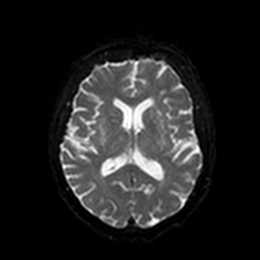
[im 56/90]
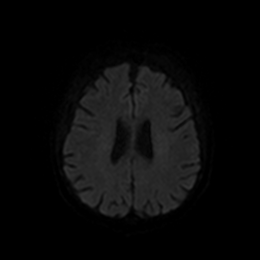
[im 67/90]
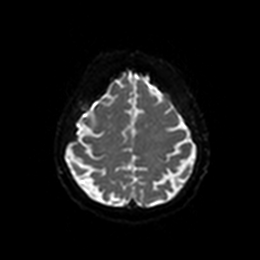
[im 78/90]
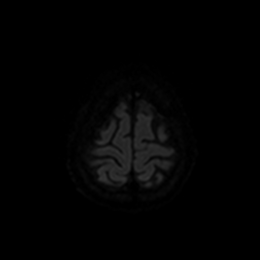
[im 90/90]
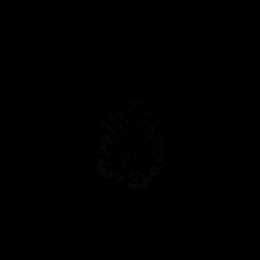

[Series 6: DWI · axial · 3.0mm · 0.88mm/px · z∈[-2,+126]mm · 4 of 45 slices shown (2 of 4)]
[im 1/45]
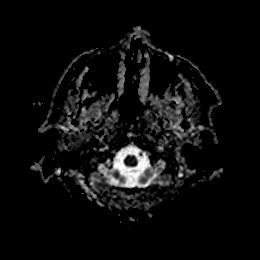
[im 15/45]
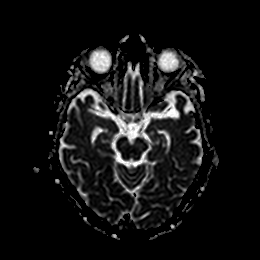
[im 30/45]
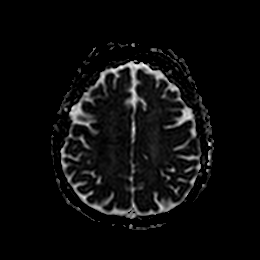
[im 45/45]
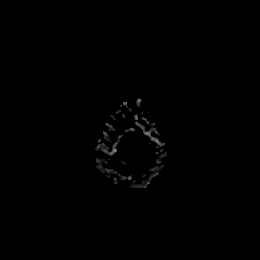

[Series 7: DWI · coronal · 4.0mm · 0.88mm/px · 6 of 64 slices shown (3 of 4)]
[im 1/64]
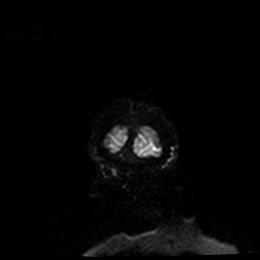
[im 13/64]
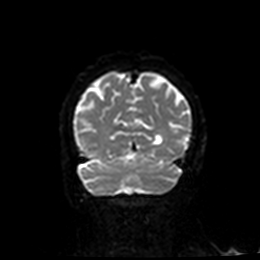
[im 26/64]
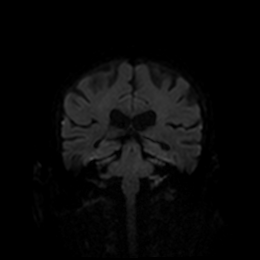
[im 38/64]
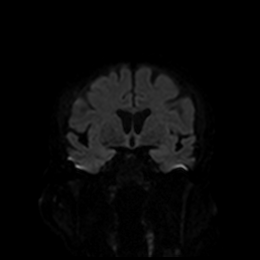
[im 51/64]
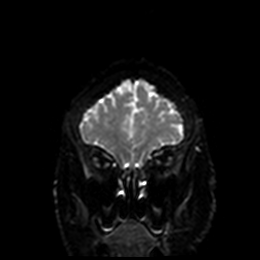
[im 64/64]
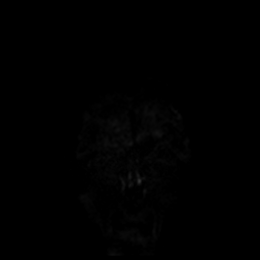

[Series 8: DWI · coronal · 4.0mm · 0.88mm/px · 3 of 32 slices shown (4 of 4)]
[im 1/32]
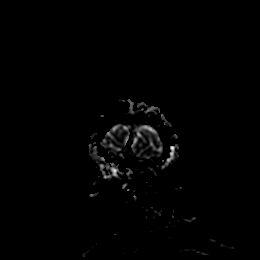
[im 16/32]
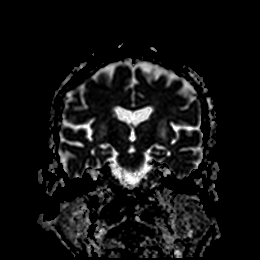
[im 32/32]
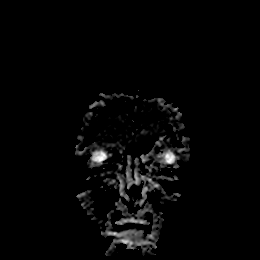

[Series 9: T1 · sagittal · 5.0mm · 0.75mm/px · 2 of 23 slices shown]
[im 1/23]
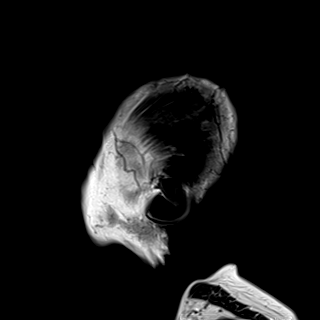
[im 23/23]
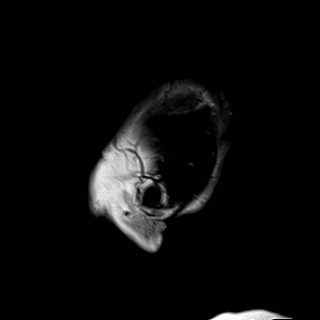

[Series 10: T2 · axial · 5.0mm · 0.72mm/px · z∈[-10,+123]mm · 2 of 24 slices shown]
[im 1/24]
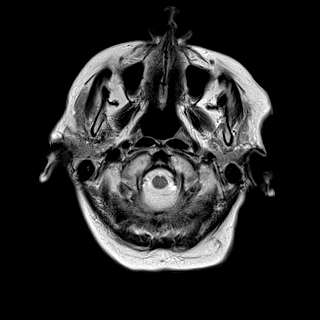
[im 24/24]
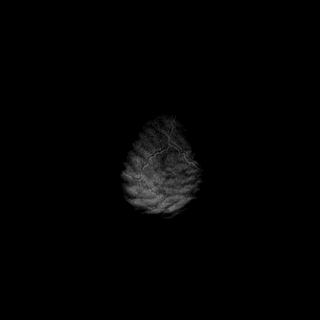

[Series 11: FLAIR · axial · 5.0mm · 0.45mm/px · z∈[-13,+121]mm · 2 of 24 slices shown]
[im 1/24]
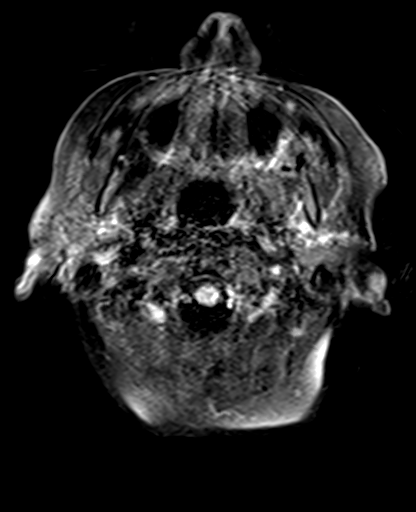
[im 24/24]
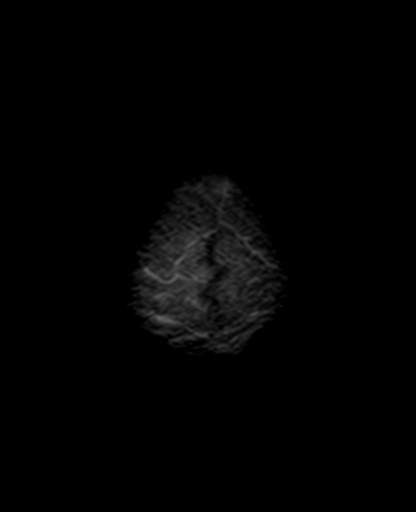

[Series 12: swi_images · axial · 3.0mm · 0.86mm/px · z∈[-12,+125]mm · 4 of 48 slices shown]
[im 1/48]
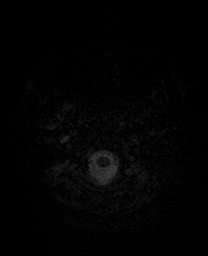
[im 16/48]
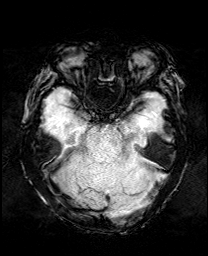
[im 32/48]
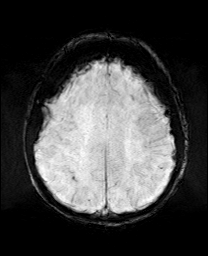
[im 48/48]
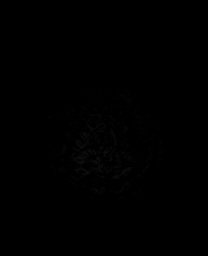

[Series 13: mip_images(sw) · axial · 24.0mm · 0.86mm/px · z∈[-1,+115]mm · 4 of 41 slices shown]
[im 1/41]
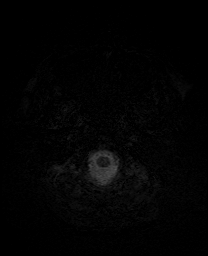
[im 14/41]
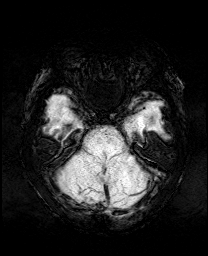
[im 27/41]
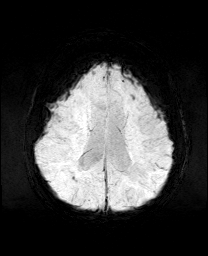
[im 41/41]
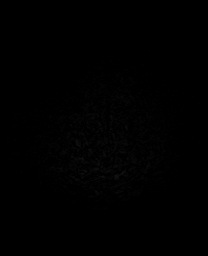

[Series 19: T2 post-contrast · coronal · 5.0mm · 0.72mm/px · 2 of 28 slices shown]
[im 1/28]
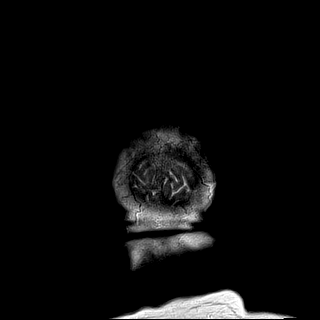
[im 28/28]
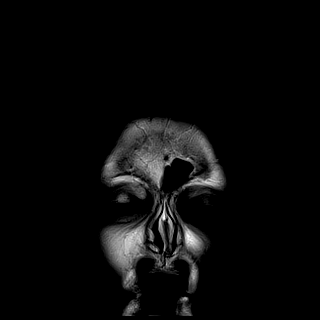

[Series 21: T1 post-contrast · coronal · 5.0mm · 0.34mm/px · 2 of 28 slices shown]
[im 1/28]
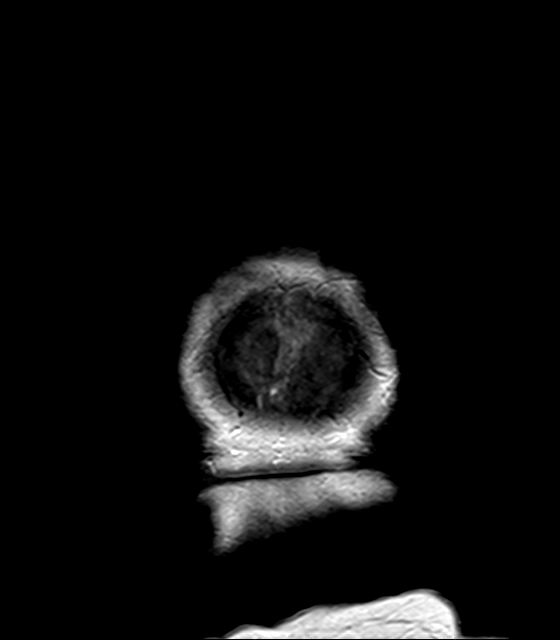
[im 28/28]
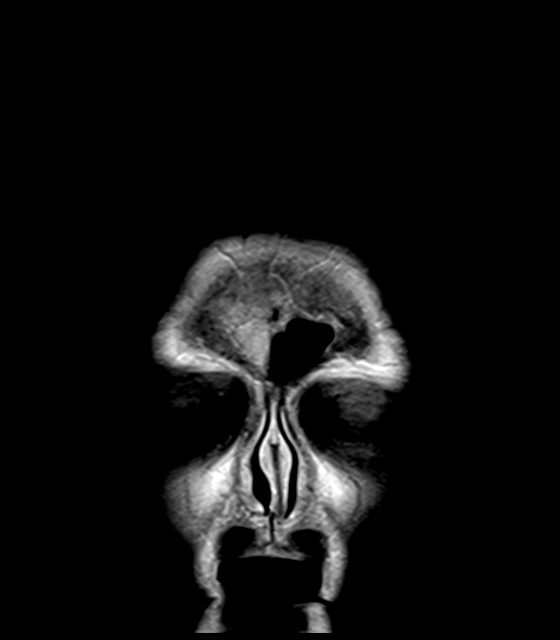

[40 of 48 positions shown; findings below may reference images not displayed]

FINDINGS: Brain: No restricted diffusion to suggest acute infarction. No
midline shift, mass effect, evidence of mass lesion,
ventriculomegaly, extra-axial collection or acute intracranial
hemorrhage. Cervicomedullary junction and pituitary are within
normal limits.

Generalized cerebral volume loss since 7334. Mild for age scattered
nonspecific cerebral white matter T2 and FLAIR hyperintensity.
Similar mild T2 heterogeneity in the pons and bilateral deep gray
matter nuclei. There are occasional chronic cerebral blood products
including 2 micro hemorrhages (left anterior frontal lobe series 12,
image 29) and a small area of superficial siderosis in the left
superior frontal gyrus (image 37). No cortical encephalomalacia. No
abnormal enhancement identified. No dural thickening

Vascular: Major intracranial vascular flow voids are stable since
7334, the left vertebral artery is dominant. The major dural venous
sinuses are enhancing and appear to be patent.

Skull and upper cervical spine: Negative visible cervical spine.
Normal bone marrow signal.

Sinuses/Orbits: Stable and negative.

Other: Mastoids remain clear. Visible internal auditory structures
appear normal. Scalp and face soft tissues appear negative.
IMPRESSION: 1.  No acute intracranial abnormality.
2. Occasional chronic cerebral blood products, including two
hemispheric chronic micro-hemorrhages and a small area of left
superior frontal gyrus superficial siderosis.
Otherwise mild for age signal changes in the brain which are
nonspecific but most commonly due to chronic small vessel disease.

## 2020-01-04 DIAGNOSIS — R54 Age-related physical debility: Secondary | ICD-10-CM | POA: Diagnosis not present

## 2020-01-04 DIAGNOSIS — Z20822 Contact with and (suspected) exposure to covid-19: Secondary | ICD-10-CM | POA: Diagnosis not present

## 2020-01-04 DIAGNOSIS — Z20828 Contact with and (suspected) exposure to other viral communicable diseases: Secondary | ICD-10-CM | POA: Diagnosis not present

## 2020-01-05 DIAGNOSIS — I2510 Atherosclerotic heart disease of native coronary artery without angina pectoris: Secondary | ICD-10-CM | POA: Diagnosis not present

## 2020-01-05 DIAGNOSIS — J9611 Chronic respiratory failure with hypoxia: Secondary | ICD-10-CM | POA: Diagnosis not present

## 2020-01-05 DIAGNOSIS — I5032 Chronic diastolic (congestive) heart failure: Secondary | ICD-10-CM | POA: Diagnosis not present

## 2020-01-05 DIAGNOSIS — J449 Chronic obstructive pulmonary disease, unspecified: Secondary | ICD-10-CM | POA: Diagnosis not present

## 2020-01-05 DIAGNOSIS — I110 Hypertensive heart disease with heart failure: Secondary | ICD-10-CM | POA: Diagnosis not present

## 2020-01-05 DIAGNOSIS — I480 Paroxysmal atrial fibrillation: Secondary | ICD-10-CM | POA: Diagnosis not present

## 2020-01-11 DIAGNOSIS — Z20822 Contact with and (suspected) exposure to covid-19: Secondary | ICD-10-CM | POA: Diagnosis not present

## 2020-01-11 DIAGNOSIS — R54 Age-related physical debility: Secondary | ICD-10-CM | POA: Diagnosis not present

## 2020-01-11 DIAGNOSIS — Z20828 Contact with and (suspected) exposure to other viral communicable diseases: Secondary | ICD-10-CM | POA: Diagnosis not present

## 2020-01-16 DIAGNOSIS — H61899 Other specified disorders of external ear, unspecified ear: Secondary | ICD-10-CM | POA: Diagnosis not present

## 2020-01-16 DIAGNOSIS — L989 Disorder of the skin and subcutaneous tissue, unspecified: Secondary | ICD-10-CM | POA: Diagnosis not present

## 2020-01-16 DIAGNOSIS — F0390 Unspecified dementia without behavioral disturbance: Secondary | ICD-10-CM | POA: Diagnosis not present

## 2020-01-17 DIAGNOSIS — K219 Gastro-esophageal reflux disease without esophagitis: Secondary | ICD-10-CM | POA: Diagnosis not present

## 2020-01-17 DIAGNOSIS — J9611 Chronic respiratory failure with hypoxia: Secondary | ICD-10-CM | POA: Diagnosis not present

## 2020-01-17 DIAGNOSIS — Z8673 Personal history of transient ischemic attack (TIA), and cerebral infarction without residual deficits: Secondary | ICD-10-CM | POA: Diagnosis not present

## 2020-01-17 DIAGNOSIS — Z86718 Personal history of other venous thrombosis and embolism: Secondary | ICD-10-CM | POA: Diagnosis not present

## 2020-01-17 DIAGNOSIS — I480 Paroxysmal atrial fibrillation: Secondary | ICD-10-CM | POA: Diagnosis not present

## 2020-01-17 DIAGNOSIS — G4733 Obstructive sleep apnea (adult) (pediatric): Secondary | ICD-10-CM | POA: Diagnosis not present

## 2020-01-17 DIAGNOSIS — E876 Hypokalemia: Secondary | ICD-10-CM | POA: Diagnosis not present

## 2020-01-17 DIAGNOSIS — J449 Chronic obstructive pulmonary disease, unspecified: Secondary | ICD-10-CM | POA: Diagnosis not present

## 2020-01-17 DIAGNOSIS — Z7902 Long term (current) use of antithrombotics/antiplatelets: Secondary | ICD-10-CM | POA: Diagnosis not present

## 2020-01-17 DIAGNOSIS — E785 Hyperlipidemia, unspecified: Secondary | ICD-10-CM | POA: Diagnosis not present

## 2020-01-17 DIAGNOSIS — Z6828 Body mass index (BMI) 28.0-28.9, adult: Secondary | ICD-10-CM | POA: Diagnosis not present

## 2020-01-17 DIAGNOSIS — Z7982 Long term (current) use of aspirin: Secondary | ICD-10-CM | POA: Diagnosis not present

## 2020-01-17 DIAGNOSIS — I110 Hypertensive heart disease with heart failure: Secondary | ICD-10-CM | POA: Diagnosis not present

## 2020-01-17 DIAGNOSIS — E669 Obesity, unspecified: Secondary | ICD-10-CM | POA: Diagnosis not present

## 2020-01-17 DIAGNOSIS — Z9981 Dependence on supplemental oxygen: Secondary | ICD-10-CM | POA: Diagnosis not present

## 2020-01-17 DIAGNOSIS — Z86711 Personal history of pulmonary embolism: Secondary | ICD-10-CM | POA: Diagnosis not present

## 2020-01-17 DIAGNOSIS — M810 Age-related osteoporosis without current pathological fracture: Secondary | ICD-10-CM | POA: Diagnosis not present

## 2020-01-17 DIAGNOSIS — M1991 Primary osteoarthritis, unspecified site: Secondary | ICD-10-CM | POA: Diagnosis not present

## 2020-01-17 DIAGNOSIS — I5032 Chronic diastolic (congestive) heart failure: Secondary | ICD-10-CM | POA: Diagnosis not present

## 2020-01-17 DIAGNOSIS — I2510 Atherosclerotic heart disease of native coronary artery without angina pectoris: Secondary | ICD-10-CM | POA: Diagnosis not present

## 2020-01-18 DIAGNOSIS — R54 Age-related physical debility: Secondary | ICD-10-CM | POA: Diagnosis not present

## 2020-01-18 DIAGNOSIS — Z20828 Contact with and (suspected) exposure to other viral communicable diseases: Secondary | ICD-10-CM | POA: Diagnosis not present

## 2020-01-18 DIAGNOSIS — Z20822 Contact with and (suspected) exposure to covid-19: Secondary | ICD-10-CM | POA: Diagnosis not present

## 2020-01-19 DIAGNOSIS — I2510 Atherosclerotic heart disease of native coronary artery without angina pectoris: Secondary | ICD-10-CM | POA: Diagnosis not present

## 2020-01-19 DIAGNOSIS — J9611 Chronic respiratory failure with hypoxia: Secondary | ICD-10-CM | POA: Diagnosis not present

## 2020-01-19 DIAGNOSIS — J449 Chronic obstructive pulmonary disease, unspecified: Secondary | ICD-10-CM | POA: Diagnosis not present

## 2020-01-19 DIAGNOSIS — N390 Urinary tract infection, site not specified: Secondary | ICD-10-CM | POA: Diagnosis not present

## 2020-01-19 DIAGNOSIS — I110 Hypertensive heart disease with heart failure: Secondary | ICD-10-CM | POA: Diagnosis not present

## 2020-01-19 DIAGNOSIS — I5032 Chronic diastolic (congestive) heart failure: Secondary | ICD-10-CM | POA: Diagnosis not present

## 2020-01-19 DIAGNOSIS — I480 Paroxysmal atrial fibrillation: Secondary | ICD-10-CM | POA: Diagnosis not present

## 2020-01-24 DIAGNOSIS — I5032 Chronic diastolic (congestive) heart failure: Secondary | ICD-10-CM | POA: Diagnosis not present

## 2020-01-24 DIAGNOSIS — J449 Chronic obstructive pulmonary disease, unspecified: Secondary | ICD-10-CM | POA: Diagnosis not present

## 2020-01-24 DIAGNOSIS — I110 Hypertensive heart disease with heart failure: Secondary | ICD-10-CM | POA: Diagnosis not present

## 2020-01-24 DIAGNOSIS — I480 Paroxysmal atrial fibrillation: Secondary | ICD-10-CM | POA: Diagnosis not present

## 2020-01-24 DIAGNOSIS — J9611 Chronic respiratory failure with hypoxia: Secondary | ICD-10-CM | POA: Diagnosis not present

## 2020-01-24 DIAGNOSIS — I2510 Atherosclerotic heart disease of native coronary artery without angina pectoris: Secondary | ICD-10-CM | POA: Diagnosis not present

## 2020-01-25 DIAGNOSIS — Z20822 Contact with and (suspected) exposure to covid-19: Secondary | ICD-10-CM | POA: Diagnosis not present

## 2020-01-25 DIAGNOSIS — Z20828 Contact with and (suspected) exposure to other viral communicable diseases: Secondary | ICD-10-CM | POA: Diagnosis not present

## 2020-01-25 DIAGNOSIS — R54 Age-related physical debility: Secondary | ICD-10-CM | POA: Diagnosis not present

## 2020-01-27 DIAGNOSIS — F0390 Unspecified dementia without behavioral disturbance: Secondary | ICD-10-CM | POA: Diagnosis not present

## 2020-01-27 DIAGNOSIS — T50Z95A Adverse effect of other vaccines and biological substances, initial encounter: Secondary | ICD-10-CM | POA: Diagnosis not present

## 2020-01-27 DIAGNOSIS — R21 Rash and other nonspecific skin eruption: Secondary | ICD-10-CM | POA: Diagnosis not present

## 2020-01-27 DIAGNOSIS — L299 Pruritus, unspecified: Secondary | ICD-10-CM | POA: Diagnosis not present

## 2020-01-31 DIAGNOSIS — I2510 Atherosclerotic heart disease of native coronary artery without angina pectoris: Secondary | ICD-10-CM | POA: Diagnosis not present

## 2020-01-31 DIAGNOSIS — J449 Chronic obstructive pulmonary disease, unspecified: Secondary | ICD-10-CM | POA: Diagnosis not present

## 2020-01-31 DIAGNOSIS — I5032 Chronic diastolic (congestive) heart failure: Secondary | ICD-10-CM | POA: Diagnosis not present

## 2020-01-31 DIAGNOSIS — J9611 Chronic respiratory failure with hypoxia: Secondary | ICD-10-CM | POA: Diagnosis not present

## 2020-01-31 DIAGNOSIS — I480 Paroxysmal atrial fibrillation: Secondary | ICD-10-CM | POA: Diagnosis not present

## 2020-01-31 DIAGNOSIS — I110 Hypertensive heart disease with heart failure: Secondary | ICD-10-CM | POA: Diagnosis not present

## 2020-02-01 DIAGNOSIS — R54 Age-related physical debility: Secondary | ICD-10-CM | POA: Diagnosis not present

## 2020-02-01 DIAGNOSIS — Z20828 Contact with and (suspected) exposure to other viral communicable diseases: Secondary | ICD-10-CM | POA: Diagnosis not present

## 2020-02-01 DIAGNOSIS — Z20822 Contact with and (suspected) exposure to covid-19: Secondary | ICD-10-CM | POA: Diagnosis not present

## 2020-02-08 DIAGNOSIS — R54 Age-related physical debility: Secondary | ICD-10-CM | POA: Diagnosis not present

## 2020-02-08 DIAGNOSIS — Z20828 Contact with and (suspected) exposure to other viral communicable diseases: Secondary | ICD-10-CM | POA: Diagnosis not present

## 2020-02-08 DIAGNOSIS — Z20822 Contact with and (suspected) exposure to covid-19: Secondary | ICD-10-CM | POA: Diagnosis not present

## 2020-02-09 DIAGNOSIS — I480 Paroxysmal atrial fibrillation: Secondary | ICD-10-CM | POA: Diagnosis not present

## 2020-02-09 DIAGNOSIS — I110 Hypertensive heart disease with heart failure: Secondary | ICD-10-CM | POA: Diagnosis not present

## 2020-02-09 DIAGNOSIS — I2510 Atherosclerotic heart disease of native coronary artery without angina pectoris: Secondary | ICD-10-CM | POA: Diagnosis not present

## 2020-02-09 DIAGNOSIS — I5032 Chronic diastolic (congestive) heart failure: Secondary | ICD-10-CM | POA: Diagnosis not present

## 2020-02-09 DIAGNOSIS — J9611 Chronic respiratory failure with hypoxia: Secondary | ICD-10-CM | POA: Diagnosis not present

## 2020-02-09 DIAGNOSIS — J449 Chronic obstructive pulmonary disease, unspecified: Secondary | ICD-10-CM | POA: Diagnosis not present

## 2020-02-14 DIAGNOSIS — J449 Chronic obstructive pulmonary disease, unspecified: Secondary | ICD-10-CM | POA: Diagnosis not present

## 2020-02-14 DIAGNOSIS — J9611 Chronic respiratory failure with hypoxia: Secondary | ICD-10-CM | POA: Diagnosis not present

## 2020-02-14 DIAGNOSIS — I110 Hypertensive heart disease with heart failure: Secondary | ICD-10-CM | POA: Diagnosis not present

## 2020-02-14 DIAGNOSIS — I5032 Chronic diastolic (congestive) heart failure: Secondary | ICD-10-CM | POA: Diagnosis not present

## 2020-02-14 DIAGNOSIS — I480 Paroxysmal atrial fibrillation: Secondary | ICD-10-CM | POA: Diagnosis not present

## 2020-02-14 DIAGNOSIS — I2510 Atherosclerotic heart disease of native coronary artery without angina pectoris: Secondary | ICD-10-CM | POA: Diagnosis not present

## 2020-02-15 DIAGNOSIS — Z20822 Contact with and (suspected) exposure to covid-19: Secondary | ICD-10-CM | POA: Diagnosis not present

## 2020-02-15 DIAGNOSIS — R54 Age-related physical debility: Secondary | ICD-10-CM | POA: Diagnosis not present

## 2020-02-15 DIAGNOSIS — Z20828 Contact with and (suspected) exposure to other viral communicable diseases: Secondary | ICD-10-CM | POA: Diagnosis not present

## 2020-02-16 DIAGNOSIS — Z86718 Personal history of other venous thrombosis and embolism: Secondary | ICD-10-CM | POA: Diagnosis not present

## 2020-02-16 DIAGNOSIS — I2510 Atherosclerotic heart disease of native coronary artery without angina pectoris: Secondary | ICD-10-CM | POA: Diagnosis not present

## 2020-02-16 DIAGNOSIS — M810 Age-related osteoporosis without current pathological fracture: Secondary | ICD-10-CM | POA: Diagnosis not present

## 2020-02-16 DIAGNOSIS — Z7902 Long term (current) use of antithrombotics/antiplatelets: Secondary | ICD-10-CM | POA: Diagnosis not present

## 2020-02-16 DIAGNOSIS — Z86711 Personal history of pulmonary embolism: Secondary | ICD-10-CM | POA: Diagnosis not present

## 2020-02-16 DIAGNOSIS — Z6828 Body mass index (BMI) 28.0-28.9, adult: Secondary | ICD-10-CM | POA: Diagnosis not present

## 2020-02-16 DIAGNOSIS — K219 Gastro-esophageal reflux disease without esophagitis: Secondary | ICD-10-CM | POA: Diagnosis not present

## 2020-02-16 DIAGNOSIS — Z7982 Long term (current) use of aspirin: Secondary | ICD-10-CM | POA: Diagnosis not present

## 2020-02-16 DIAGNOSIS — E785 Hyperlipidemia, unspecified: Secondary | ICD-10-CM | POA: Diagnosis not present

## 2020-02-16 DIAGNOSIS — G4733 Obstructive sleep apnea (adult) (pediatric): Secondary | ICD-10-CM | POA: Diagnosis not present

## 2020-02-16 DIAGNOSIS — E876 Hypokalemia: Secondary | ICD-10-CM | POA: Diagnosis not present

## 2020-02-16 DIAGNOSIS — I110 Hypertensive heart disease with heart failure: Secondary | ICD-10-CM | POA: Diagnosis not present

## 2020-02-16 DIAGNOSIS — M1991 Primary osteoarthritis, unspecified site: Secondary | ICD-10-CM | POA: Diagnosis not present

## 2020-02-16 DIAGNOSIS — I5032 Chronic diastolic (congestive) heart failure: Secondary | ICD-10-CM | POA: Diagnosis not present

## 2020-02-16 DIAGNOSIS — Z8673 Personal history of transient ischemic attack (TIA), and cerebral infarction without residual deficits: Secondary | ICD-10-CM | POA: Diagnosis not present

## 2020-02-16 DIAGNOSIS — J449 Chronic obstructive pulmonary disease, unspecified: Secondary | ICD-10-CM | POA: Diagnosis not present

## 2020-02-16 DIAGNOSIS — I480 Paroxysmal atrial fibrillation: Secondary | ICD-10-CM | POA: Diagnosis not present

## 2020-02-16 DIAGNOSIS — E669 Obesity, unspecified: Secondary | ICD-10-CM | POA: Diagnosis not present

## 2020-02-16 DIAGNOSIS — J9611 Chronic respiratory failure with hypoxia: Secondary | ICD-10-CM | POA: Diagnosis not present

## 2020-02-16 DIAGNOSIS — Z9981 Dependence on supplemental oxygen: Secondary | ICD-10-CM | POA: Diagnosis not present

## 2020-02-21 DIAGNOSIS — J449 Chronic obstructive pulmonary disease, unspecified: Secondary | ICD-10-CM | POA: Diagnosis not present

## 2020-02-21 DIAGNOSIS — I480 Paroxysmal atrial fibrillation: Secondary | ICD-10-CM | POA: Diagnosis not present

## 2020-02-21 DIAGNOSIS — I2510 Atherosclerotic heart disease of native coronary artery without angina pectoris: Secondary | ICD-10-CM | POA: Diagnosis not present

## 2020-02-21 DIAGNOSIS — J9611 Chronic respiratory failure with hypoxia: Secondary | ICD-10-CM | POA: Diagnosis not present

## 2020-02-21 DIAGNOSIS — I110 Hypertensive heart disease with heart failure: Secondary | ICD-10-CM | POA: Diagnosis not present

## 2020-02-21 DIAGNOSIS — I5032 Chronic diastolic (congestive) heart failure: Secondary | ICD-10-CM | POA: Diagnosis not present

## 2020-02-22 DIAGNOSIS — R54 Age-related physical debility: Secondary | ICD-10-CM | POA: Diagnosis not present

## 2020-02-22 DIAGNOSIS — Z20822 Contact with and (suspected) exposure to covid-19: Secondary | ICD-10-CM | POA: Diagnosis not present

## 2020-02-22 DIAGNOSIS — Z20828 Contact with and (suspected) exposure to other viral communicable diseases: Secondary | ICD-10-CM | POA: Diagnosis not present

## 2020-02-28 DIAGNOSIS — J449 Chronic obstructive pulmonary disease, unspecified: Secondary | ICD-10-CM | POA: Diagnosis not present

## 2020-02-28 DIAGNOSIS — I480 Paroxysmal atrial fibrillation: Secondary | ICD-10-CM | POA: Diagnosis not present

## 2020-02-28 DIAGNOSIS — I5032 Chronic diastolic (congestive) heart failure: Secondary | ICD-10-CM | POA: Diagnosis not present

## 2020-02-28 DIAGNOSIS — I2510 Atherosclerotic heart disease of native coronary artery without angina pectoris: Secondary | ICD-10-CM | POA: Diagnosis not present

## 2020-02-28 DIAGNOSIS — J9611 Chronic respiratory failure with hypoxia: Secondary | ICD-10-CM | POA: Diagnosis not present

## 2020-02-28 DIAGNOSIS — I110 Hypertensive heart disease with heart failure: Secondary | ICD-10-CM | POA: Diagnosis not present

## 2020-03-05 DIAGNOSIS — I2510 Atherosclerotic heart disease of native coronary artery without angina pectoris: Secondary | ICD-10-CM | POA: Diagnosis not present

## 2020-03-05 DIAGNOSIS — J449 Chronic obstructive pulmonary disease, unspecified: Secondary | ICD-10-CM | POA: Diagnosis not present

## 2020-03-05 DIAGNOSIS — J9611 Chronic respiratory failure with hypoxia: Secondary | ICD-10-CM | POA: Diagnosis not present

## 2020-03-05 DIAGNOSIS — I110 Hypertensive heart disease with heart failure: Secondary | ICD-10-CM | POA: Diagnosis not present

## 2020-03-05 DIAGNOSIS — I5032 Chronic diastolic (congestive) heart failure: Secondary | ICD-10-CM | POA: Diagnosis not present

## 2020-03-05 DIAGNOSIS — I480 Paroxysmal atrial fibrillation: Secondary | ICD-10-CM | POA: Diagnosis not present

## 2020-03-13 DIAGNOSIS — J449 Chronic obstructive pulmonary disease, unspecified: Secondary | ICD-10-CM | POA: Diagnosis not present

## 2020-03-13 DIAGNOSIS — I480 Paroxysmal atrial fibrillation: Secondary | ICD-10-CM | POA: Diagnosis not present

## 2020-03-13 DIAGNOSIS — I5032 Chronic diastolic (congestive) heart failure: Secondary | ICD-10-CM | POA: Diagnosis not present

## 2020-03-13 DIAGNOSIS — I110 Hypertensive heart disease with heart failure: Secondary | ICD-10-CM | POA: Diagnosis not present

## 2020-03-13 DIAGNOSIS — I2510 Atherosclerotic heart disease of native coronary artery without angina pectoris: Secondary | ICD-10-CM | POA: Diagnosis not present

## 2020-03-13 DIAGNOSIS — J9611 Chronic respiratory failure with hypoxia: Secondary | ICD-10-CM | POA: Diagnosis not present

## 2020-03-17 DIAGNOSIS — Z86718 Personal history of other venous thrombosis and embolism: Secondary | ICD-10-CM | POA: Diagnosis not present

## 2020-03-17 DIAGNOSIS — Z8673 Personal history of transient ischemic attack (TIA), and cerebral infarction without residual deficits: Secondary | ICD-10-CM | POA: Diagnosis not present

## 2020-03-17 DIAGNOSIS — Z9981 Dependence on supplemental oxygen: Secondary | ICD-10-CM | POA: Diagnosis not present

## 2020-03-17 DIAGNOSIS — E785 Hyperlipidemia, unspecified: Secondary | ICD-10-CM | POA: Diagnosis not present

## 2020-03-17 DIAGNOSIS — I5032 Chronic diastolic (congestive) heart failure: Secondary | ICD-10-CM | POA: Diagnosis not present

## 2020-03-17 DIAGNOSIS — I480 Paroxysmal atrial fibrillation: Secondary | ICD-10-CM | POA: Diagnosis not present

## 2020-03-17 DIAGNOSIS — I2510 Atherosclerotic heart disease of native coronary artery without angina pectoris: Secondary | ICD-10-CM | POA: Diagnosis not present

## 2020-03-17 DIAGNOSIS — M810 Age-related osteoporosis without current pathological fracture: Secondary | ICD-10-CM | POA: Diagnosis not present

## 2020-03-17 DIAGNOSIS — J9611 Chronic respiratory failure with hypoxia: Secondary | ICD-10-CM | POA: Diagnosis not present

## 2020-03-17 DIAGNOSIS — Z7902 Long term (current) use of antithrombotics/antiplatelets: Secondary | ICD-10-CM | POA: Diagnosis not present

## 2020-03-17 DIAGNOSIS — E669 Obesity, unspecified: Secondary | ICD-10-CM | POA: Diagnosis not present

## 2020-03-17 DIAGNOSIS — G4733 Obstructive sleep apnea (adult) (pediatric): Secondary | ICD-10-CM | POA: Diagnosis not present

## 2020-03-17 DIAGNOSIS — Z7982 Long term (current) use of aspirin: Secondary | ICD-10-CM | POA: Diagnosis not present

## 2020-03-17 DIAGNOSIS — M1991 Primary osteoarthritis, unspecified site: Secondary | ICD-10-CM | POA: Diagnosis not present

## 2020-03-17 DIAGNOSIS — I110 Hypertensive heart disease with heart failure: Secondary | ICD-10-CM | POA: Diagnosis not present

## 2020-03-17 DIAGNOSIS — K219 Gastro-esophageal reflux disease without esophagitis: Secondary | ICD-10-CM | POA: Diagnosis not present

## 2020-03-17 DIAGNOSIS — Z86711 Personal history of pulmonary embolism: Secondary | ICD-10-CM | POA: Diagnosis not present

## 2020-03-17 DIAGNOSIS — Z6828 Body mass index (BMI) 28.0-28.9, adult: Secondary | ICD-10-CM | POA: Diagnosis not present

## 2020-03-17 DIAGNOSIS — E876 Hypokalemia: Secondary | ICD-10-CM | POA: Diagnosis not present

## 2020-03-17 DIAGNOSIS — J449 Chronic obstructive pulmonary disease, unspecified: Secondary | ICD-10-CM | POA: Diagnosis not present

## 2020-03-19 DIAGNOSIS — M79641 Pain in right hand: Secondary | ICD-10-CM | POA: Diagnosis not present

## 2020-03-19 DIAGNOSIS — M1990 Unspecified osteoarthritis, unspecified site: Secondary | ICD-10-CM | POA: Diagnosis not present

## 2020-03-19 DIAGNOSIS — F0390 Unspecified dementia without behavioral disturbance: Secondary | ICD-10-CM | POA: Diagnosis not present

## 2020-03-22 DIAGNOSIS — I480 Paroxysmal atrial fibrillation: Secondary | ICD-10-CM | POA: Diagnosis not present

## 2020-03-22 DIAGNOSIS — I5032 Chronic diastolic (congestive) heart failure: Secondary | ICD-10-CM | POA: Diagnosis not present

## 2020-03-22 DIAGNOSIS — J449 Chronic obstructive pulmonary disease, unspecified: Secondary | ICD-10-CM | POA: Diagnosis not present

## 2020-03-22 DIAGNOSIS — J9611 Chronic respiratory failure with hypoxia: Secondary | ICD-10-CM | POA: Diagnosis not present

## 2020-03-22 DIAGNOSIS — I2510 Atherosclerotic heart disease of native coronary artery without angina pectoris: Secondary | ICD-10-CM | POA: Diagnosis not present

## 2020-03-22 DIAGNOSIS — I110 Hypertensive heart disease with heart failure: Secondary | ICD-10-CM | POA: Diagnosis not present

## 2020-03-26 DIAGNOSIS — I5032 Chronic diastolic (congestive) heart failure: Secondary | ICD-10-CM | POA: Diagnosis not present

## 2020-03-26 DIAGNOSIS — J449 Chronic obstructive pulmonary disease, unspecified: Secondary | ICD-10-CM | POA: Diagnosis not present

## 2020-03-26 DIAGNOSIS — I480 Paroxysmal atrial fibrillation: Secondary | ICD-10-CM | POA: Diagnosis not present

## 2020-03-26 DIAGNOSIS — J9611 Chronic respiratory failure with hypoxia: Secondary | ICD-10-CM | POA: Diagnosis not present

## 2020-03-26 DIAGNOSIS — I2510 Atherosclerotic heart disease of native coronary artery without angina pectoris: Secondary | ICD-10-CM | POA: Diagnosis not present

## 2020-03-26 DIAGNOSIS — I110 Hypertensive heart disease with heart failure: Secondary | ICD-10-CM | POA: Diagnosis not present

## 2020-04-02 DIAGNOSIS — J449 Chronic obstructive pulmonary disease, unspecified: Secondary | ICD-10-CM | POA: Diagnosis not present

## 2020-04-02 DIAGNOSIS — I5032 Chronic diastolic (congestive) heart failure: Secondary | ICD-10-CM | POA: Diagnosis not present

## 2020-04-02 DIAGNOSIS — I480 Paroxysmal atrial fibrillation: Secondary | ICD-10-CM | POA: Diagnosis not present

## 2020-04-02 DIAGNOSIS — J9611 Chronic respiratory failure with hypoxia: Secondary | ICD-10-CM | POA: Diagnosis not present

## 2020-04-02 DIAGNOSIS — I2510 Atherosclerotic heart disease of native coronary artery without angina pectoris: Secondary | ICD-10-CM | POA: Diagnosis not present

## 2020-04-02 DIAGNOSIS — I110 Hypertensive heart disease with heart failure: Secondary | ICD-10-CM | POA: Diagnosis not present

## 2020-04-10 DIAGNOSIS — L600 Ingrowing nail: Secondary | ICD-10-CM | POA: Diagnosis not present

## 2020-04-11 DIAGNOSIS — I2510 Atherosclerotic heart disease of native coronary artery without angina pectoris: Secondary | ICD-10-CM | POA: Diagnosis not present

## 2020-04-11 DIAGNOSIS — F0390 Unspecified dementia without behavioral disturbance: Secondary | ICD-10-CM | POA: Diagnosis not present

## 2020-04-11 DIAGNOSIS — M6281 Muscle weakness (generalized): Secondary | ICD-10-CM | POA: Diagnosis not present

## 2020-04-11 DIAGNOSIS — I480 Paroxysmal atrial fibrillation: Secondary | ICD-10-CM | POA: Diagnosis not present

## 2020-04-11 DIAGNOSIS — K2100 Gastro-esophageal reflux disease with esophagitis, without bleeding: Secondary | ICD-10-CM | POA: Diagnosis not present

## 2020-04-11 DIAGNOSIS — J9611 Chronic respiratory failure with hypoxia: Secondary | ICD-10-CM | POA: Diagnosis not present

## 2020-04-11 DIAGNOSIS — I129 Hypertensive chronic kidney disease with stage 1 through stage 4 chronic kidney disease, or unspecified chronic kidney disease: Secondary | ICD-10-CM | POA: Diagnosis not present

## 2020-04-11 DIAGNOSIS — I110 Hypertensive heart disease with heart failure: Secondary | ICD-10-CM | POA: Diagnosis not present

## 2020-04-11 DIAGNOSIS — I5032 Chronic diastolic (congestive) heart failure: Secondary | ICD-10-CM | POA: Diagnosis not present

## 2020-04-11 DIAGNOSIS — E1122 Type 2 diabetes mellitus with diabetic chronic kidney disease: Secondary | ICD-10-CM | POA: Diagnosis not present

## 2020-04-11 DIAGNOSIS — J449 Chronic obstructive pulmonary disease, unspecified: Secondary | ICD-10-CM | POA: Diagnosis not present

## 2020-04-11 DIAGNOSIS — Z6831 Body mass index (BMI) 31.0-31.9, adult: Secondary | ICD-10-CM | POA: Diagnosis not present

## 2020-04-11 DIAGNOSIS — N1831 Chronic kidney disease, stage 3a: Secondary | ICD-10-CM | POA: Diagnosis not present

## 2020-04-11 DIAGNOSIS — R269 Unspecified abnormalities of gait and mobility: Secondary | ICD-10-CM | POA: Diagnosis not present

## 2020-04-13 DIAGNOSIS — E559 Vitamin D deficiency, unspecified: Secondary | ICD-10-CM | POA: Diagnosis not present

## 2020-04-13 DIAGNOSIS — E119 Type 2 diabetes mellitus without complications: Secondary | ICD-10-CM | POA: Diagnosis not present

## 2020-04-13 DIAGNOSIS — E785 Hyperlipidemia, unspecified: Secondary | ICD-10-CM | POA: Diagnosis not present

## 2020-04-13 DIAGNOSIS — I10 Essential (primary) hypertension: Secondary | ICD-10-CM | POA: Diagnosis not present

## 2020-04-13 DIAGNOSIS — D649 Anemia, unspecified: Secondary | ICD-10-CM | POA: Diagnosis not present

## 2020-04-16 DIAGNOSIS — Z9981 Dependence on supplemental oxygen: Secondary | ICD-10-CM | POA: Diagnosis not present

## 2020-04-16 DIAGNOSIS — E876 Hypokalemia: Secondary | ICD-10-CM | POA: Diagnosis not present

## 2020-04-16 DIAGNOSIS — Z86718 Personal history of other venous thrombosis and embolism: Secondary | ICD-10-CM | POA: Diagnosis not present

## 2020-04-16 DIAGNOSIS — M1991 Primary osteoarthritis, unspecified site: Secondary | ICD-10-CM | POA: Diagnosis not present

## 2020-04-16 DIAGNOSIS — I5032 Chronic diastolic (congestive) heart failure: Secondary | ICD-10-CM | POA: Diagnosis not present

## 2020-04-16 DIAGNOSIS — I110 Hypertensive heart disease with heart failure: Secondary | ICD-10-CM | POA: Diagnosis not present

## 2020-04-16 DIAGNOSIS — I2510 Atherosclerotic heart disease of native coronary artery without angina pectoris: Secondary | ICD-10-CM | POA: Diagnosis not present

## 2020-04-16 DIAGNOSIS — E785 Hyperlipidemia, unspecified: Secondary | ICD-10-CM | POA: Diagnosis not present

## 2020-04-16 DIAGNOSIS — M810 Age-related osteoporosis without current pathological fracture: Secondary | ICD-10-CM | POA: Diagnosis not present

## 2020-04-16 DIAGNOSIS — G4733 Obstructive sleep apnea (adult) (pediatric): Secondary | ICD-10-CM | POA: Diagnosis not present

## 2020-04-16 DIAGNOSIS — Z8673 Personal history of transient ischemic attack (TIA), and cerebral infarction without residual deficits: Secondary | ICD-10-CM | POA: Diagnosis not present

## 2020-04-16 DIAGNOSIS — J449 Chronic obstructive pulmonary disease, unspecified: Secondary | ICD-10-CM | POA: Diagnosis not present

## 2020-04-16 DIAGNOSIS — I480 Paroxysmal atrial fibrillation: Secondary | ICD-10-CM | POA: Diagnosis not present

## 2020-04-16 DIAGNOSIS — Z7902 Long term (current) use of antithrombotics/antiplatelets: Secondary | ICD-10-CM | POA: Diagnosis not present

## 2020-04-16 DIAGNOSIS — Z6828 Body mass index (BMI) 28.0-28.9, adult: Secondary | ICD-10-CM | POA: Diagnosis not present

## 2020-04-16 DIAGNOSIS — Z7982 Long term (current) use of aspirin: Secondary | ICD-10-CM | POA: Diagnosis not present

## 2020-04-16 DIAGNOSIS — E669 Obesity, unspecified: Secondary | ICD-10-CM | POA: Diagnosis not present

## 2020-04-16 DIAGNOSIS — K219 Gastro-esophageal reflux disease without esophagitis: Secondary | ICD-10-CM | POA: Diagnosis not present

## 2020-04-16 DIAGNOSIS — Z86711 Personal history of pulmonary embolism: Secondary | ICD-10-CM | POA: Diagnosis not present

## 2020-04-16 DIAGNOSIS — J9611 Chronic respiratory failure with hypoxia: Secondary | ICD-10-CM | POA: Diagnosis not present

## 2020-04-18 DIAGNOSIS — J449 Chronic obstructive pulmonary disease, unspecified: Secondary | ICD-10-CM | POA: Diagnosis not present

## 2020-04-18 DIAGNOSIS — J9611 Chronic respiratory failure with hypoxia: Secondary | ICD-10-CM | POA: Diagnosis not present

## 2020-04-18 DIAGNOSIS — I480 Paroxysmal atrial fibrillation: Secondary | ICD-10-CM | POA: Diagnosis not present

## 2020-04-18 DIAGNOSIS — I110 Hypertensive heart disease with heart failure: Secondary | ICD-10-CM | POA: Diagnosis not present

## 2020-04-18 DIAGNOSIS — I5032 Chronic diastolic (congestive) heart failure: Secondary | ICD-10-CM | POA: Diagnosis not present

## 2020-04-18 DIAGNOSIS — I2510 Atherosclerotic heart disease of native coronary artery without angina pectoris: Secondary | ICD-10-CM | POA: Diagnosis not present

## 2020-04-23 DIAGNOSIS — J449 Chronic obstructive pulmonary disease, unspecified: Secondary | ICD-10-CM | POA: Diagnosis not present

## 2020-04-23 DIAGNOSIS — I5032 Chronic diastolic (congestive) heart failure: Secondary | ICD-10-CM | POA: Diagnosis not present

## 2020-04-23 DIAGNOSIS — I110 Hypertensive heart disease with heart failure: Secondary | ICD-10-CM | POA: Diagnosis not present

## 2020-04-23 DIAGNOSIS — I480 Paroxysmal atrial fibrillation: Secondary | ICD-10-CM | POA: Diagnosis not present

## 2020-04-23 DIAGNOSIS — J9611 Chronic respiratory failure with hypoxia: Secondary | ICD-10-CM | POA: Diagnosis not present

## 2020-04-23 DIAGNOSIS — I2510 Atherosclerotic heart disease of native coronary artery without angina pectoris: Secondary | ICD-10-CM | POA: Diagnosis not present

## 2020-05-02 DIAGNOSIS — J449 Chronic obstructive pulmonary disease, unspecified: Secondary | ICD-10-CM | POA: Diagnosis not present

## 2020-05-02 DIAGNOSIS — R269 Unspecified abnormalities of gait and mobility: Secondary | ICD-10-CM | POA: Diagnosis not present

## 2020-05-02 DIAGNOSIS — R0782 Intercostal pain: Secondary | ICD-10-CM | POA: Diagnosis not present

## 2020-05-02 DIAGNOSIS — J9611 Chronic respiratory failure with hypoxia: Secondary | ICD-10-CM | POA: Diagnosis not present

## 2020-05-02 DIAGNOSIS — I480 Paroxysmal atrial fibrillation: Secondary | ICD-10-CM | POA: Diagnosis not present

## 2020-05-02 DIAGNOSIS — I110 Hypertensive heart disease with heart failure: Secondary | ICD-10-CM | POA: Diagnosis not present

## 2020-05-02 DIAGNOSIS — M6281 Muscle weakness (generalized): Secondary | ICD-10-CM | POA: Diagnosis not present

## 2020-05-02 DIAGNOSIS — F0390 Unspecified dementia without behavioral disturbance: Secondary | ICD-10-CM | POA: Diagnosis not present

## 2020-05-02 DIAGNOSIS — I5032 Chronic diastolic (congestive) heart failure: Secondary | ICD-10-CM | POA: Diagnosis not present

## 2020-05-02 DIAGNOSIS — I2510 Atherosclerotic heart disease of native coronary artery without angina pectoris: Secondary | ICD-10-CM | POA: Diagnosis not present

## 2020-05-09 DIAGNOSIS — I2510 Atherosclerotic heart disease of native coronary artery without angina pectoris: Secondary | ICD-10-CM | POA: Diagnosis not present

## 2020-05-09 DIAGNOSIS — J449 Chronic obstructive pulmonary disease, unspecified: Secondary | ICD-10-CM | POA: Diagnosis not present

## 2020-05-09 DIAGNOSIS — I110 Hypertensive heart disease with heart failure: Secondary | ICD-10-CM | POA: Diagnosis not present

## 2020-05-09 DIAGNOSIS — J9611 Chronic respiratory failure with hypoxia: Secondary | ICD-10-CM | POA: Diagnosis not present

## 2020-05-09 DIAGNOSIS — I480 Paroxysmal atrial fibrillation: Secondary | ICD-10-CM | POA: Diagnosis not present

## 2020-05-09 DIAGNOSIS — I5032 Chronic diastolic (congestive) heart failure: Secondary | ICD-10-CM | POA: Diagnosis not present

## 2020-05-14 DIAGNOSIS — I110 Hypertensive heart disease with heart failure: Secondary | ICD-10-CM | POA: Diagnosis not present

## 2020-05-14 DIAGNOSIS — J9611 Chronic respiratory failure with hypoxia: Secondary | ICD-10-CM | POA: Diagnosis not present

## 2020-05-14 DIAGNOSIS — I480 Paroxysmal atrial fibrillation: Secondary | ICD-10-CM | POA: Diagnosis not present

## 2020-05-14 DIAGNOSIS — I5032 Chronic diastolic (congestive) heart failure: Secondary | ICD-10-CM | POA: Diagnosis not present

## 2020-05-14 DIAGNOSIS — I2510 Atherosclerotic heart disease of native coronary artery without angina pectoris: Secondary | ICD-10-CM | POA: Diagnosis not present

## 2020-05-14 DIAGNOSIS — J449 Chronic obstructive pulmonary disease, unspecified: Secondary | ICD-10-CM | POA: Diagnosis not present

## 2020-05-16 DIAGNOSIS — Z86718 Personal history of other venous thrombosis and embolism: Secondary | ICD-10-CM | POA: Diagnosis not present

## 2020-05-16 DIAGNOSIS — E876 Hypokalemia: Secondary | ICD-10-CM | POA: Diagnosis not present

## 2020-05-16 DIAGNOSIS — I2510 Atherosclerotic heart disease of native coronary artery without angina pectoris: Secondary | ICD-10-CM | POA: Diagnosis not present

## 2020-05-16 DIAGNOSIS — Z86711 Personal history of pulmonary embolism: Secondary | ICD-10-CM | POA: Diagnosis not present

## 2020-05-16 DIAGNOSIS — Z7982 Long term (current) use of aspirin: Secondary | ICD-10-CM | POA: Diagnosis not present

## 2020-05-16 DIAGNOSIS — J449 Chronic obstructive pulmonary disease, unspecified: Secondary | ICD-10-CM | POA: Diagnosis not present

## 2020-05-16 DIAGNOSIS — K219 Gastro-esophageal reflux disease without esophagitis: Secondary | ICD-10-CM | POA: Diagnosis not present

## 2020-05-16 DIAGNOSIS — Z7902 Long term (current) use of antithrombotics/antiplatelets: Secondary | ICD-10-CM | POA: Diagnosis not present

## 2020-05-16 DIAGNOSIS — M810 Age-related osteoporosis without current pathological fracture: Secondary | ICD-10-CM | POA: Diagnosis not present

## 2020-05-16 DIAGNOSIS — Z8673 Personal history of transient ischemic attack (TIA), and cerebral infarction without residual deficits: Secondary | ICD-10-CM | POA: Diagnosis not present

## 2020-05-16 DIAGNOSIS — J9611 Chronic respiratory failure with hypoxia: Secondary | ICD-10-CM | POA: Diagnosis not present

## 2020-05-16 DIAGNOSIS — G4733 Obstructive sleep apnea (adult) (pediatric): Secondary | ICD-10-CM | POA: Diagnosis not present

## 2020-05-16 DIAGNOSIS — M1991 Primary osteoarthritis, unspecified site: Secondary | ICD-10-CM | POA: Diagnosis not present

## 2020-05-16 DIAGNOSIS — I110 Hypertensive heart disease with heart failure: Secondary | ICD-10-CM | POA: Diagnosis not present

## 2020-05-16 DIAGNOSIS — Z6828 Body mass index (BMI) 28.0-28.9, adult: Secondary | ICD-10-CM | POA: Diagnosis not present

## 2020-05-16 DIAGNOSIS — I5032 Chronic diastolic (congestive) heart failure: Secondary | ICD-10-CM | POA: Diagnosis not present

## 2020-05-16 DIAGNOSIS — E785 Hyperlipidemia, unspecified: Secondary | ICD-10-CM | POA: Diagnosis not present

## 2020-05-16 DIAGNOSIS — Z9981 Dependence on supplemental oxygen: Secondary | ICD-10-CM | POA: Diagnosis not present

## 2020-05-16 DIAGNOSIS — I480 Paroxysmal atrial fibrillation: Secondary | ICD-10-CM | POA: Diagnosis not present

## 2020-05-16 DIAGNOSIS — E669 Obesity, unspecified: Secondary | ICD-10-CM | POA: Diagnosis not present

## 2020-05-17 DIAGNOSIS — Z20828 Contact with and (suspected) exposure to other viral communicable diseases: Secondary | ICD-10-CM | POA: Diagnosis not present

## 2020-06-19 DIAGNOSIS — L600 Ingrowing nail: Secondary | ICD-10-CM | POA: Diagnosis not present

## 2020-06-27 DIAGNOSIS — R0981 Nasal congestion: Secondary | ICD-10-CM | POA: Diagnosis not present

## 2020-06-27 DIAGNOSIS — R5381 Other malaise: Secondary | ICD-10-CM | POA: Diagnosis not present

## 2020-06-27 DIAGNOSIS — R519 Headache, unspecified: Secondary | ICD-10-CM | POA: Diagnosis not present

## 2020-06-27 DIAGNOSIS — F0390 Unspecified dementia without behavioral disturbance: Secondary | ICD-10-CM | POA: Diagnosis not present

## 2020-07-05 DIAGNOSIS — Z20828 Contact with and (suspected) exposure to other viral communicable diseases: Secondary | ICD-10-CM | POA: Diagnosis not present

## 2020-07-06 DIAGNOSIS — F0390 Unspecified dementia without behavioral disturbance: Secondary | ICD-10-CM | POA: Diagnosis not present

## 2020-07-06 DIAGNOSIS — J309 Allergic rhinitis, unspecified: Secondary | ICD-10-CM | POA: Diagnosis not present

## 2020-07-12 DIAGNOSIS — Z20828 Contact with and (suspected) exposure to other viral communicable diseases: Secondary | ICD-10-CM | POA: Diagnosis not present

## 2020-07-19 DIAGNOSIS — Z20828 Contact with and (suspected) exposure to other viral communicable diseases: Secondary | ICD-10-CM | POA: Diagnosis not present

## 2020-07-25 DIAGNOSIS — Z20828 Contact with and (suspected) exposure to other viral communicable diseases: Secondary | ICD-10-CM | POA: Diagnosis not present

## 2020-08-01 DIAGNOSIS — Z20828 Contact with and (suspected) exposure to other viral communicable diseases: Secondary | ICD-10-CM | POA: Diagnosis not present

## 2020-08-08 DIAGNOSIS — Z20828 Contact with and (suspected) exposure to other viral communicable diseases: Secondary | ICD-10-CM | POA: Diagnosis not present

## 2020-08-15 DIAGNOSIS — Z20828 Contact with and (suspected) exposure to other viral communicable diseases: Secondary | ICD-10-CM | POA: Diagnosis not present

## 2020-08-22 DIAGNOSIS — Z23 Encounter for immunization: Secondary | ICD-10-CM | POA: Diagnosis not present

## 2020-08-22 DIAGNOSIS — Z20828 Contact with and (suspected) exposure to other viral communicable diseases: Secondary | ICD-10-CM | POA: Diagnosis not present

## 2020-08-29 DIAGNOSIS — Z20828 Contact with and (suspected) exposure to other viral communicable diseases: Secondary | ICD-10-CM | POA: Diagnosis not present

## 2020-09-05 DIAGNOSIS — E1122 Type 2 diabetes mellitus with diabetic chronic kidney disease: Secondary | ICD-10-CM | POA: Diagnosis not present

## 2020-09-05 DIAGNOSIS — I129 Hypertensive chronic kidney disease with stage 1 through stage 4 chronic kidney disease, or unspecified chronic kidney disease: Secondary | ICD-10-CM | POA: Diagnosis not present

## 2020-09-05 DIAGNOSIS — N1831 Chronic kidney disease, stage 3a: Secondary | ICD-10-CM | POA: Diagnosis not present

## 2020-09-05 DIAGNOSIS — Z20828 Contact with and (suspected) exposure to other viral communicable diseases: Secondary | ICD-10-CM | POA: Diagnosis not present

## 2020-09-05 DIAGNOSIS — K2100 Gastro-esophageal reflux disease with esophagitis, without bleeding: Secondary | ICD-10-CM | POA: Diagnosis not present

## 2020-09-05 DIAGNOSIS — F0390 Unspecified dementia without behavioral disturbance: Secondary | ICD-10-CM | POA: Diagnosis not present

## 2020-09-06 DIAGNOSIS — N179 Acute kidney failure, unspecified: Secondary | ICD-10-CM | POA: Diagnosis not present

## 2020-09-06 DIAGNOSIS — D649 Anemia, unspecified: Secondary | ICD-10-CM | POA: Diagnosis not present

## 2020-09-06 DIAGNOSIS — I5032 Chronic diastolic (congestive) heart failure: Secondary | ICD-10-CM | POA: Diagnosis not present

## 2020-09-06 DIAGNOSIS — E1140 Type 2 diabetes mellitus with diabetic neuropathy, unspecified: Secondary | ICD-10-CM | POA: Diagnosis not present

## 2020-09-18 DIAGNOSIS — L600 Ingrowing nail: Secondary | ICD-10-CM | POA: Diagnosis not present

## 2020-09-24 DIAGNOSIS — R410 Disorientation, unspecified: Secondary | ICD-10-CM | POA: Diagnosis not present

## 2020-09-24 DIAGNOSIS — F0390 Unspecified dementia without behavioral disturbance: Secondary | ICD-10-CM | POA: Diagnosis not present

## 2020-09-24 DIAGNOSIS — R4182 Altered mental status, unspecified: Secondary | ICD-10-CM | POA: Diagnosis not present

## 2020-09-25 DIAGNOSIS — E039 Hypothyroidism, unspecified: Secondary | ICD-10-CM | POA: Diagnosis not present

## 2020-09-25 DIAGNOSIS — N390 Urinary tract infection, site not specified: Secondary | ICD-10-CM | POA: Diagnosis not present

## 2020-10-12 DIAGNOSIS — H6010 Cellulitis of external ear, unspecified ear: Secondary | ICD-10-CM | POA: Diagnosis not present

## 2020-10-12 DIAGNOSIS — R52 Pain, unspecified: Secondary | ICD-10-CM | POA: Diagnosis not present

## 2020-10-12 DIAGNOSIS — L989 Disorder of the skin and subcutaneous tissue, unspecified: Secondary | ICD-10-CM | POA: Diagnosis not present

## 2020-10-12 DIAGNOSIS — F0390 Unspecified dementia without behavioral disturbance: Secondary | ICD-10-CM | POA: Diagnosis not present

## 2020-11-03 DIAGNOSIS — N390 Urinary tract infection, site not specified: Secondary | ICD-10-CM | POA: Diagnosis not present

## 2020-11-05 DIAGNOSIS — R451 Restlessness and agitation: Secondary | ICD-10-CM | POA: Diagnosis not present

## 2020-11-05 DIAGNOSIS — R4182 Altered mental status, unspecified: Secondary | ICD-10-CM | POA: Diagnosis not present

## 2020-11-05 DIAGNOSIS — F0390 Unspecified dementia without behavioral disturbance: Secondary | ICD-10-CM | POA: Diagnosis not present

## 2020-11-13 DIAGNOSIS — Z20828 Contact with and (suspected) exposure to other viral communicable diseases: Secondary | ICD-10-CM | POA: Diagnosis not present

## 2020-11-19 DIAGNOSIS — R269 Unspecified abnormalities of gait and mobility: Secondary | ICD-10-CM | POA: Diagnosis not present

## 2020-11-19 DIAGNOSIS — L299 Pruritus, unspecified: Secondary | ICD-10-CM | POA: Diagnosis not present

## 2020-11-19 DIAGNOSIS — F0390 Unspecified dementia without behavioral disturbance: Secondary | ICD-10-CM | POA: Diagnosis not present

## 2020-11-19 DIAGNOSIS — L989 Disorder of the skin and subcutaneous tissue, unspecified: Secondary | ICD-10-CM | POA: Diagnosis not present

## 2020-11-21 DIAGNOSIS — Z20828 Contact with and (suspected) exposure to other viral communicable diseases: Secondary | ICD-10-CM | POA: Diagnosis not present

## 2020-12-17 DIAGNOSIS — Z20828 Contact with and (suspected) exposure to other viral communicable diseases: Secondary | ICD-10-CM | POA: Diagnosis not present

## 2020-12-18 DIAGNOSIS — L609 Nail disorder, unspecified: Secondary | ICD-10-CM | POA: Diagnosis not present

## 2020-12-18 DIAGNOSIS — L600 Ingrowing nail: Secondary | ICD-10-CM | POA: Diagnosis not present

## 2020-12-20 DIAGNOSIS — R0981 Nasal congestion: Secondary | ICD-10-CM | POA: Diagnosis not present

## 2020-12-20 DIAGNOSIS — R519 Headache, unspecified: Secondary | ICD-10-CM | POA: Diagnosis not present

## 2020-12-20 DIAGNOSIS — F0390 Unspecified dementia without behavioral disturbance: Secondary | ICD-10-CM | POA: Diagnosis not present

## 2020-12-20 DIAGNOSIS — J019 Acute sinusitis, unspecified: Secondary | ICD-10-CM | POA: Diagnosis not present

## 2020-12-25 DIAGNOSIS — Z20828 Contact with and (suspected) exposure to other viral communicable diseases: Secondary | ICD-10-CM | POA: Diagnosis not present

## 2020-12-31 DIAGNOSIS — Z6830 Body mass index (BMI) 30.0-30.9, adult: Secondary | ICD-10-CM | POA: Diagnosis not present

## 2020-12-31 DIAGNOSIS — R634 Abnormal weight loss: Secondary | ICD-10-CM | POA: Diagnosis not present

## 2020-12-31 DIAGNOSIS — F0390 Unspecified dementia without behavioral disturbance: Secondary | ICD-10-CM | POA: Diagnosis not present

## 2021-01-01 DIAGNOSIS — Z20828 Contact with and (suspected) exposure to other viral communicable diseases: Secondary | ICD-10-CM | POA: Diagnosis not present

## 2021-01-02 DIAGNOSIS — R4182 Altered mental status, unspecified: Secondary | ICD-10-CM | POA: Diagnosis not present

## 2021-01-02 DIAGNOSIS — F0391 Unspecified dementia with behavioral disturbance: Secondary | ICD-10-CM | POA: Diagnosis not present

## 2021-01-02 DIAGNOSIS — Z87440 Personal history of urinary (tract) infections: Secondary | ICD-10-CM | POA: Diagnosis not present

## 2021-01-02 DIAGNOSIS — R1030 Lower abdominal pain, unspecified: Secondary | ICD-10-CM | POA: Diagnosis not present

## 2021-01-08 DIAGNOSIS — Z20828 Contact with and (suspected) exposure to other viral communicable diseases: Secondary | ICD-10-CM | POA: Diagnosis not present

## 2021-01-14 DIAGNOSIS — L249 Irritant contact dermatitis, unspecified cause: Secondary | ICD-10-CM | POA: Diagnosis not present

## 2021-01-14 DIAGNOSIS — R6884 Jaw pain: Secondary | ICD-10-CM | POA: Diagnosis not present

## 2021-01-14 DIAGNOSIS — F0390 Unspecified dementia without behavioral disturbance: Secondary | ICD-10-CM | POA: Diagnosis not present

## 2021-01-16 DIAGNOSIS — Z20828 Contact with and (suspected) exposure to other viral communicable diseases: Secondary | ICD-10-CM | POA: Diagnosis not present

## 2021-01-23 DIAGNOSIS — Z20828 Contact with and (suspected) exposure to other viral communicable diseases: Secondary | ICD-10-CM | POA: Diagnosis not present

## 2021-01-24 DIAGNOSIS — E1122 Type 2 diabetes mellitus with diabetic chronic kidney disease: Secondary | ICD-10-CM | POA: Diagnosis not present

## 2021-01-24 DIAGNOSIS — I129 Hypertensive chronic kidney disease with stage 1 through stage 4 chronic kidney disease, or unspecified chronic kidney disease: Secondary | ICD-10-CM | POA: Diagnosis not present

## 2021-01-24 DIAGNOSIS — N1831 Chronic kidney disease, stage 3a: Secondary | ICD-10-CM | POA: Diagnosis not present

## 2021-01-24 DIAGNOSIS — F0390 Unspecified dementia without behavioral disturbance: Secondary | ICD-10-CM | POA: Diagnosis not present

## 2021-01-24 DIAGNOSIS — K2100 Gastro-esophageal reflux disease with esophagitis, without bleeding: Secondary | ICD-10-CM | POA: Diagnosis not present

## 2021-01-28 DIAGNOSIS — M6281 Muscle weakness (generalized): Secondary | ICD-10-CM | POA: Diagnosis not present

## 2021-01-28 DIAGNOSIS — R799 Abnormal finding of blood chemistry, unspecified: Secondary | ICD-10-CM | POA: Diagnosis not present

## 2021-01-28 DIAGNOSIS — D649 Anemia, unspecified: Secondary | ICD-10-CM | POA: Diagnosis not present

## 2021-01-28 DIAGNOSIS — R7303 Prediabetes: Secondary | ICD-10-CM | POA: Diagnosis not present

## 2021-01-28 DIAGNOSIS — I10 Essential (primary) hypertension: Secondary | ICD-10-CM | POA: Diagnosis not present

## 2021-01-28 DIAGNOSIS — F0390 Unspecified dementia without behavioral disturbance: Secondary | ICD-10-CM | POA: Diagnosis not present

## 2021-01-28 DIAGNOSIS — R269 Unspecified abnormalities of gait and mobility: Secondary | ICD-10-CM | POA: Diagnosis not present

## 2021-02-01 DIAGNOSIS — M6281 Muscle weakness (generalized): Secondary | ICD-10-CM | POA: Diagnosis not present

## 2021-02-01 DIAGNOSIS — F0390 Unspecified dementia without behavioral disturbance: Secondary | ICD-10-CM | POA: Diagnosis not present

## 2021-02-01 DIAGNOSIS — R600 Localized edema: Secondary | ICD-10-CM | POA: Diagnosis not present

## 2021-02-01 DIAGNOSIS — R269 Unspecified abnormalities of gait and mobility: Secondary | ICD-10-CM | POA: Diagnosis not present

## 2021-02-20 DIAGNOSIS — R519 Headache, unspecified: Secondary | ICD-10-CM | POA: Diagnosis not present

## 2021-02-20 DIAGNOSIS — N1831 Chronic kidney disease, stage 3a: Secondary | ICD-10-CM | POA: Diagnosis not present

## 2021-02-20 DIAGNOSIS — I129 Hypertensive chronic kidney disease with stage 1 through stage 4 chronic kidney disease, or unspecified chronic kidney disease: Secondary | ICD-10-CM | POA: Diagnosis not present

## 2021-02-20 DIAGNOSIS — G43109 Migraine with aura, not intractable, without status migrainosus: Secondary | ICD-10-CM | POA: Diagnosis not present

## 2021-02-20 DIAGNOSIS — R5381 Other malaise: Secondary | ICD-10-CM | POA: Diagnosis not present

## 2021-02-20 DIAGNOSIS — F0390 Unspecified dementia without behavioral disturbance: Secondary | ICD-10-CM | POA: Diagnosis not present

## 2021-02-21 DIAGNOSIS — M6281 Muscle weakness (generalized): Secondary | ICD-10-CM | POA: Diagnosis not present

## 2021-02-21 DIAGNOSIS — R269 Unspecified abnormalities of gait and mobility: Secondary | ICD-10-CM | POA: Diagnosis not present

## 2021-02-21 DIAGNOSIS — E039 Hypothyroidism, unspecified: Secondary | ICD-10-CM | POA: Diagnosis not present

## 2021-02-21 DIAGNOSIS — F330 Major depressive disorder, recurrent, mild: Secondary | ICD-10-CM | POA: Diagnosis not present

## 2021-02-21 DIAGNOSIS — E559 Vitamin D deficiency, unspecified: Secondary | ICD-10-CM | POA: Diagnosis not present

## 2021-02-21 DIAGNOSIS — I110 Hypertensive heart disease with heart failure: Secondary | ICD-10-CM | POA: Diagnosis not present

## 2021-02-21 DIAGNOSIS — D649 Anemia, unspecified: Secondary | ICD-10-CM | POA: Diagnosis not present

## 2021-02-21 DIAGNOSIS — I5032 Chronic diastolic (congestive) heart failure: Secondary | ICD-10-CM | POA: Diagnosis not present

## 2021-02-21 DIAGNOSIS — N179 Acute kidney failure, unspecified: Secondary | ICD-10-CM | POA: Diagnosis not present

## 2021-02-21 DIAGNOSIS — F0390 Unspecified dementia without behavioral disturbance: Secondary | ICD-10-CM | POA: Diagnosis not present

## 2021-02-21 DIAGNOSIS — Z1389 Encounter for screening for other disorder: Secondary | ICD-10-CM | POA: Diagnosis not present

## 2021-02-21 DIAGNOSIS — E612 Magnesium deficiency: Secondary | ICD-10-CM | POA: Diagnosis not present

## 2021-02-23 DIAGNOSIS — E539 Vitamin B deficiency, unspecified: Secondary | ICD-10-CM | POA: Diagnosis not present

## 2021-02-23 DIAGNOSIS — R54 Age-related physical debility: Secondary | ICD-10-CM | POA: Diagnosis not present

## 2021-02-23 DIAGNOSIS — F330 Major depressive disorder, recurrent, mild: Secondary | ICD-10-CM | POA: Diagnosis not present

## 2021-02-23 DIAGNOSIS — F0390 Unspecified dementia without behavioral disturbance: Secondary | ICD-10-CM | POA: Diagnosis not present

## 2021-02-23 DIAGNOSIS — Z9181 History of falling: Secondary | ICD-10-CM | POA: Diagnosis not present

## 2021-02-23 DIAGNOSIS — R531 Weakness: Secondary | ICD-10-CM | POA: Diagnosis not present

## 2021-02-23 DIAGNOSIS — R2689 Other abnormalities of gait and mobility: Secondary | ICD-10-CM | POA: Diagnosis not present

## 2021-02-23 DIAGNOSIS — M6281 Muscle weakness (generalized): Secondary | ICD-10-CM | POA: Diagnosis not present

## 2021-02-23 DIAGNOSIS — I10 Essential (primary) hypertension: Secondary | ICD-10-CM | POA: Diagnosis not present

## 2021-02-23 DIAGNOSIS — Z79899 Other long term (current) drug therapy: Secondary | ICD-10-CM | POA: Diagnosis not present

## 2021-02-25 DIAGNOSIS — M1990 Unspecified osteoarthritis, unspecified site: Secondary | ICD-10-CM | POA: Diagnosis not present

## 2021-02-25 DIAGNOSIS — R519 Headache, unspecified: Secondary | ICD-10-CM | POA: Diagnosis not present

## 2021-02-25 DIAGNOSIS — F0390 Unspecified dementia without behavioral disturbance: Secondary | ICD-10-CM | POA: Diagnosis not present

## 2021-02-25 DIAGNOSIS — M542 Cervicalgia: Secondary | ICD-10-CM | POA: Diagnosis not present

## 2021-02-25 DIAGNOSIS — G894 Chronic pain syndrome: Secondary | ICD-10-CM | POA: Diagnosis not present

## 2021-02-27 DIAGNOSIS — F330 Major depressive disorder, recurrent, mild: Secondary | ICD-10-CM | POA: Diagnosis not present

## 2021-02-27 DIAGNOSIS — E539 Vitamin B deficiency, unspecified: Secondary | ICD-10-CM | POA: Diagnosis not present

## 2021-02-27 DIAGNOSIS — R2689 Other abnormalities of gait and mobility: Secondary | ICD-10-CM | POA: Diagnosis not present

## 2021-02-27 DIAGNOSIS — R54 Age-related physical debility: Secondary | ICD-10-CM | POA: Diagnosis not present

## 2021-02-27 DIAGNOSIS — I10 Essential (primary) hypertension: Secondary | ICD-10-CM | POA: Diagnosis not present

## 2021-02-27 DIAGNOSIS — F0390 Unspecified dementia without behavioral disturbance: Secondary | ICD-10-CM | POA: Diagnosis not present

## 2021-03-04 DIAGNOSIS — E539 Vitamin B deficiency, unspecified: Secondary | ICD-10-CM | POA: Diagnosis not present

## 2021-03-04 DIAGNOSIS — F330 Major depressive disorder, recurrent, mild: Secondary | ICD-10-CM | POA: Diagnosis not present

## 2021-03-04 DIAGNOSIS — R54 Age-related physical debility: Secondary | ICD-10-CM | POA: Diagnosis not present

## 2021-03-04 DIAGNOSIS — F0390 Unspecified dementia without behavioral disturbance: Secondary | ICD-10-CM | POA: Diagnosis not present

## 2021-03-04 DIAGNOSIS — R2689 Other abnormalities of gait and mobility: Secondary | ICD-10-CM | POA: Diagnosis not present

## 2021-03-04 DIAGNOSIS — I10 Essential (primary) hypertension: Secondary | ICD-10-CM | POA: Diagnosis not present

## 2021-03-06 DIAGNOSIS — K2100 Gastro-esophageal reflux disease with esophagitis, without bleeding: Secondary | ICD-10-CM | POA: Diagnosis not present

## 2021-03-06 DIAGNOSIS — R142 Eructation: Secondary | ICD-10-CM | POA: Diagnosis not present

## 2021-03-06 DIAGNOSIS — F0390 Unspecified dementia without behavioral disturbance: Secondary | ICD-10-CM | POA: Diagnosis not present

## 2021-03-06 DIAGNOSIS — R5381 Other malaise: Secondary | ICD-10-CM | POA: Diagnosis not present

## 2021-03-13 DIAGNOSIS — F330 Major depressive disorder, recurrent, mild: Secondary | ICD-10-CM | POA: Diagnosis not present

## 2021-03-13 DIAGNOSIS — F0390 Unspecified dementia without behavioral disturbance: Secondary | ICD-10-CM | POA: Diagnosis not present

## 2021-03-13 DIAGNOSIS — R54 Age-related physical debility: Secondary | ICD-10-CM | POA: Diagnosis not present

## 2021-03-13 DIAGNOSIS — R2689 Other abnormalities of gait and mobility: Secondary | ICD-10-CM | POA: Diagnosis not present

## 2021-03-13 DIAGNOSIS — I10 Essential (primary) hypertension: Secondary | ICD-10-CM | POA: Diagnosis not present

## 2021-03-13 DIAGNOSIS — E539 Vitamin B deficiency, unspecified: Secondary | ICD-10-CM | POA: Diagnosis not present

## 2021-03-25 DIAGNOSIS — R54 Age-related physical debility: Secondary | ICD-10-CM | POA: Diagnosis not present

## 2021-03-25 DIAGNOSIS — R531 Weakness: Secondary | ICD-10-CM | POA: Diagnosis not present

## 2021-03-25 DIAGNOSIS — Z79899 Other long term (current) drug therapy: Secondary | ICD-10-CM | POA: Diagnosis not present

## 2021-03-25 DIAGNOSIS — Z9181 History of falling: Secondary | ICD-10-CM | POA: Diagnosis not present

## 2021-03-25 DIAGNOSIS — E539 Vitamin B deficiency, unspecified: Secondary | ICD-10-CM | POA: Diagnosis not present

## 2021-03-25 DIAGNOSIS — R2689 Other abnormalities of gait and mobility: Secondary | ICD-10-CM | POA: Diagnosis not present

## 2021-03-25 DIAGNOSIS — M6281 Muscle weakness (generalized): Secondary | ICD-10-CM | POA: Diagnosis not present

## 2021-03-25 DIAGNOSIS — F330 Major depressive disorder, recurrent, mild: Secondary | ICD-10-CM | POA: Diagnosis not present

## 2021-03-25 DIAGNOSIS — I10 Essential (primary) hypertension: Secondary | ICD-10-CM | POA: Diagnosis not present

## 2021-03-25 DIAGNOSIS — F0390 Unspecified dementia without behavioral disturbance: Secondary | ICD-10-CM | POA: Diagnosis not present

## 2021-03-26 DIAGNOSIS — R519 Headache, unspecified: Secondary | ICD-10-CM | POA: Diagnosis not present

## 2021-03-26 DIAGNOSIS — G8929 Other chronic pain: Secondary | ICD-10-CM | POA: Diagnosis not present

## 2021-03-29 DIAGNOSIS — R54 Age-related physical debility: Secondary | ICD-10-CM | POA: Diagnosis not present

## 2021-03-29 DIAGNOSIS — F330 Major depressive disorder, recurrent, mild: Secondary | ICD-10-CM | POA: Diagnosis not present

## 2021-03-29 DIAGNOSIS — F0390 Unspecified dementia without behavioral disturbance: Secondary | ICD-10-CM | POA: Diagnosis not present

## 2021-03-29 DIAGNOSIS — I10 Essential (primary) hypertension: Secondary | ICD-10-CM | POA: Diagnosis not present

## 2021-03-29 DIAGNOSIS — E539 Vitamin B deficiency, unspecified: Secondary | ICD-10-CM | POA: Diagnosis not present

## 2021-03-29 DIAGNOSIS — R2689 Other abnormalities of gait and mobility: Secondary | ICD-10-CM | POA: Diagnosis not present

## 2021-04-02 DIAGNOSIS — Z20828 Contact with and (suspected) exposure to other viral communicable diseases: Secondary | ICD-10-CM | POA: Diagnosis not present

## 2021-04-04 DIAGNOSIS — E539 Vitamin B deficiency, unspecified: Secondary | ICD-10-CM | POA: Diagnosis not present

## 2021-04-04 DIAGNOSIS — R54 Age-related physical debility: Secondary | ICD-10-CM | POA: Diagnosis not present

## 2021-04-04 DIAGNOSIS — F330 Major depressive disorder, recurrent, mild: Secondary | ICD-10-CM | POA: Diagnosis not present

## 2021-04-04 DIAGNOSIS — I10 Essential (primary) hypertension: Secondary | ICD-10-CM | POA: Diagnosis not present

## 2021-04-04 DIAGNOSIS — R2689 Other abnormalities of gait and mobility: Secondary | ICD-10-CM | POA: Diagnosis not present

## 2021-04-04 DIAGNOSIS — F0390 Unspecified dementia without behavioral disturbance: Secondary | ICD-10-CM | POA: Diagnosis not present

## 2021-04-08 DIAGNOSIS — Z20828 Contact with and (suspected) exposure to other viral communicable diseases: Secondary | ICD-10-CM | POA: Diagnosis not present

## 2021-04-09 DIAGNOSIS — R519 Headache, unspecified: Secondary | ICD-10-CM | POA: Diagnosis not present

## 2021-04-09 DIAGNOSIS — R54 Age-related physical debility: Secondary | ICD-10-CM | POA: Diagnosis not present

## 2021-04-09 DIAGNOSIS — G8929 Other chronic pain: Secondary | ICD-10-CM | POA: Diagnosis not present

## 2021-04-09 DIAGNOSIS — F330 Major depressive disorder, recurrent, mild: Secondary | ICD-10-CM | POA: Diagnosis not present

## 2021-04-09 DIAGNOSIS — I10 Essential (primary) hypertension: Secondary | ICD-10-CM | POA: Diagnosis not present

## 2021-04-09 DIAGNOSIS — G319 Degenerative disease of nervous system, unspecified: Secondary | ICD-10-CM | POA: Diagnosis not present

## 2021-04-09 DIAGNOSIS — E539 Vitamin B deficiency, unspecified: Secondary | ICD-10-CM | POA: Diagnosis not present

## 2021-04-09 DIAGNOSIS — F0390 Unspecified dementia without behavioral disturbance: Secondary | ICD-10-CM | POA: Diagnosis not present

## 2021-04-09 DIAGNOSIS — R2689 Other abnormalities of gait and mobility: Secondary | ICD-10-CM | POA: Diagnosis not present

## 2021-04-15 DIAGNOSIS — F330 Major depressive disorder, recurrent, mild: Secondary | ICD-10-CM | POA: Diagnosis not present

## 2021-04-15 DIAGNOSIS — R2689 Other abnormalities of gait and mobility: Secondary | ICD-10-CM | POA: Diagnosis not present

## 2021-04-15 DIAGNOSIS — R54 Age-related physical debility: Secondary | ICD-10-CM | POA: Diagnosis not present

## 2021-04-15 DIAGNOSIS — E539 Vitamin B deficiency, unspecified: Secondary | ICD-10-CM | POA: Diagnosis not present

## 2021-04-15 DIAGNOSIS — Z20828 Contact with and (suspected) exposure to other viral communicable diseases: Secondary | ICD-10-CM | POA: Diagnosis not present

## 2021-04-15 DIAGNOSIS — I10 Essential (primary) hypertension: Secondary | ICD-10-CM | POA: Diagnosis not present

## 2021-04-15 DIAGNOSIS — F0390 Unspecified dementia without behavioral disturbance: Secondary | ICD-10-CM | POA: Diagnosis not present

## 2021-04-22 DIAGNOSIS — Z20828 Contact with and (suspected) exposure to other viral communicable diseases: Secondary | ICD-10-CM | POA: Diagnosis not present

## 2021-04-22 DIAGNOSIS — E539 Vitamin B deficiency, unspecified: Secondary | ICD-10-CM | POA: Diagnosis not present

## 2021-04-22 DIAGNOSIS — R2689 Other abnormalities of gait and mobility: Secondary | ICD-10-CM | POA: Diagnosis not present

## 2021-04-22 DIAGNOSIS — I10 Essential (primary) hypertension: Secondary | ICD-10-CM | POA: Diagnosis not present

## 2021-04-22 DIAGNOSIS — F0390 Unspecified dementia without behavioral disturbance: Secondary | ICD-10-CM | POA: Diagnosis not present

## 2021-04-22 DIAGNOSIS — R54 Age-related physical debility: Secondary | ICD-10-CM | POA: Diagnosis not present

## 2021-04-22 DIAGNOSIS — F330 Major depressive disorder, recurrent, mild: Secondary | ICD-10-CM | POA: Diagnosis not present

## 2021-04-24 DIAGNOSIS — M6281 Muscle weakness (generalized): Secondary | ICD-10-CM | POA: Diagnosis not present

## 2021-04-24 DIAGNOSIS — F0390 Unspecified dementia without behavioral disturbance: Secondary | ICD-10-CM | POA: Diagnosis not present

## 2021-04-24 DIAGNOSIS — Z79899 Other long term (current) drug therapy: Secondary | ICD-10-CM | POA: Diagnosis not present

## 2021-04-24 DIAGNOSIS — E539 Vitamin B deficiency, unspecified: Secondary | ICD-10-CM | POA: Diagnosis not present

## 2021-04-24 DIAGNOSIS — R2689 Other abnormalities of gait and mobility: Secondary | ICD-10-CM | POA: Diagnosis not present

## 2021-04-24 DIAGNOSIS — I10 Essential (primary) hypertension: Secondary | ICD-10-CM | POA: Diagnosis not present

## 2021-04-24 DIAGNOSIS — R54 Age-related physical debility: Secondary | ICD-10-CM | POA: Diagnosis not present

## 2021-04-24 DIAGNOSIS — Z9181 History of falling: Secondary | ICD-10-CM | POA: Diagnosis not present

## 2021-04-24 DIAGNOSIS — F330 Major depressive disorder, recurrent, mild: Secondary | ICD-10-CM | POA: Diagnosis not present

## 2021-04-24 DIAGNOSIS — R531 Weakness: Secondary | ICD-10-CM | POA: Diagnosis not present

## 2021-04-25 DIAGNOSIS — U071 COVID-19: Secondary | ICD-10-CM | POA: Diagnosis not present

## 2021-04-25 DIAGNOSIS — R5381 Other malaise: Secondary | ICD-10-CM | POA: Diagnosis not present

## 2021-04-25 DIAGNOSIS — R509 Fever, unspecified: Secondary | ICD-10-CM | POA: Diagnosis not present

## 2021-04-25 DIAGNOSIS — F0390 Unspecified dementia without behavioral disturbance: Secondary | ICD-10-CM | POA: Diagnosis not present

## 2021-04-30 DIAGNOSIS — R2689 Other abnormalities of gait and mobility: Secondary | ICD-10-CM | POA: Diagnosis not present

## 2021-04-30 DIAGNOSIS — F330 Major depressive disorder, recurrent, mild: Secondary | ICD-10-CM | POA: Diagnosis not present

## 2021-04-30 DIAGNOSIS — I10 Essential (primary) hypertension: Secondary | ICD-10-CM | POA: Diagnosis not present

## 2021-04-30 DIAGNOSIS — R54 Age-related physical debility: Secondary | ICD-10-CM | POA: Diagnosis not present

## 2021-04-30 DIAGNOSIS — E539 Vitamin B deficiency, unspecified: Secondary | ICD-10-CM | POA: Diagnosis not present

## 2021-04-30 DIAGNOSIS — F0390 Unspecified dementia without behavioral disturbance: Secondary | ICD-10-CM | POA: Diagnosis not present

## 2021-05-08 DIAGNOSIS — I639 Cerebral infarction, unspecified: Secondary | ICD-10-CM | POA: Diagnosis not present

## 2021-05-08 DIAGNOSIS — I6789 Other cerebrovascular disease: Secondary | ICD-10-CM | POA: Diagnosis not present

## 2021-05-08 DIAGNOSIS — R7989 Other specified abnormal findings of blood chemistry: Secondary | ICD-10-CM | POA: Diagnosis not present

## 2021-05-09 DIAGNOSIS — I10 Essential (primary) hypertension: Secondary | ICD-10-CM | POA: Diagnosis not present

## 2021-05-09 DIAGNOSIS — R2689 Other abnormalities of gait and mobility: Secondary | ICD-10-CM | POA: Diagnosis not present

## 2021-05-09 DIAGNOSIS — F330 Major depressive disorder, recurrent, mild: Secondary | ICD-10-CM | POA: Diagnosis not present

## 2021-05-09 DIAGNOSIS — F0390 Unspecified dementia without behavioral disturbance: Secondary | ICD-10-CM | POA: Diagnosis not present

## 2021-05-09 DIAGNOSIS — E539 Vitamin B deficiency, unspecified: Secondary | ICD-10-CM | POA: Diagnosis not present

## 2021-05-09 DIAGNOSIS — R54 Age-related physical debility: Secondary | ICD-10-CM | POA: Diagnosis not present

## 2021-05-10 DIAGNOSIS — R519 Headache, unspecified: Secondary | ICD-10-CM | POA: Diagnosis not present

## 2021-05-10 DIAGNOSIS — R9402 Abnormal brain scan: Secondary | ICD-10-CM | POA: Diagnosis not present

## 2021-05-10 DIAGNOSIS — I639 Cerebral infarction, unspecified: Secondary | ICD-10-CM | POA: Diagnosis not present

## 2021-05-10 DIAGNOSIS — F0390 Unspecified dementia without behavioral disturbance: Secondary | ICD-10-CM | POA: Diagnosis not present

## 2021-05-10 DIAGNOSIS — R9082 White matter disease, unspecified: Secondary | ICD-10-CM | POA: Diagnosis not present

## 2021-05-15 DIAGNOSIS — E539 Vitamin B deficiency, unspecified: Secondary | ICD-10-CM | POA: Diagnosis not present

## 2021-05-15 DIAGNOSIS — I10 Essential (primary) hypertension: Secondary | ICD-10-CM | POA: Diagnosis not present

## 2021-05-15 DIAGNOSIS — R2689 Other abnormalities of gait and mobility: Secondary | ICD-10-CM | POA: Diagnosis not present

## 2021-05-15 DIAGNOSIS — R54 Age-related physical debility: Secondary | ICD-10-CM | POA: Diagnosis not present

## 2021-05-15 DIAGNOSIS — F330 Major depressive disorder, recurrent, mild: Secondary | ICD-10-CM | POA: Diagnosis not present

## 2021-05-15 DIAGNOSIS — F0390 Unspecified dementia without behavioral disturbance: Secondary | ICD-10-CM | POA: Diagnosis not present

## 2021-05-17 DIAGNOSIS — R7303 Prediabetes: Secondary | ICD-10-CM | POA: Diagnosis not present

## 2021-05-17 DIAGNOSIS — E7800 Pure hypercholesterolemia, unspecified: Secondary | ICD-10-CM | POA: Diagnosis not present

## 2021-05-22 DIAGNOSIS — F0390 Unspecified dementia without behavioral disturbance: Secondary | ICD-10-CM | POA: Diagnosis not present

## 2021-05-22 DIAGNOSIS — E1122 Type 2 diabetes mellitus with diabetic chronic kidney disease: Secondary | ICD-10-CM | POA: Diagnosis not present

## 2021-05-22 DIAGNOSIS — M6281 Muscle weakness (generalized): Secondary | ICD-10-CM | POA: Diagnosis not present

## 2021-05-22 DIAGNOSIS — I129 Hypertensive chronic kidney disease with stage 1 through stage 4 chronic kidney disease, or unspecified chronic kidney disease: Secondary | ICD-10-CM | POA: Diagnosis not present

## 2021-05-22 DIAGNOSIS — M1990 Unspecified osteoarthritis, unspecified site: Secondary | ICD-10-CM | POA: Diagnosis not present

## 2021-05-22 DIAGNOSIS — G894 Chronic pain syndrome: Secondary | ICD-10-CM | POA: Diagnosis not present

## 2021-05-22 DIAGNOSIS — N1831 Chronic kidney disease, stage 3a: Secondary | ICD-10-CM | POA: Diagnosis not present

## 2021-05-22 DIAGNOSIS — R269 Unspecified abnormalities of gait and mobility: Secondary | ICD-10-CM | POA: Diagnosis not present

## 2021-05-25 DIAGNOSIS — R410 Disorientation, unspecified: Secondary | ICD-10-CM | POA: Diagnosis not present

## 2021-05-25 DIAGNOSIS — J9621 Acute and chronic respiratory failure with hypoxia: Secondary | ICD-10-CM | POA: Diagnosis present

## 2021-05-25 DIAGNOSIS — D638 Anemia in other chronic diseases classified elsewhere: Secondary | ICD-10-CM | POA: Diagnosis present

## 2021-05-25 DIAGNOSIS — E1169 Type 2 diabetes mellitus with other specified complication: Secondary | ICD-10-CM | POA: Diagnosis not present

## 2021-05-25 DIAGNOSIS — I2510 Atherosclerotic heart disease of native coronary artery without angina pectoris: Secondary | ICD-10-CM | POA: Diagnosis present

## 2021-05-25 DIAGNOSIS — K6389 Other specified diseases of intestine: Secondary | ICD-10-CM | POA: Diagnosis not present

## 2021-05-25 DIAGNOSIS — I10 Essential (primary) hypertension: Secondary | ICD-10-CM | POA: Diagnosis not present

## 2021-05-25 DIAGNOSIS — I700 Atherosclerosis of aorta: Secondary | ICD-10-CM | POA: Diagnosis not present

## 2021-05-25 DIAGNOSIS — J449 Chronic obstructive pulmonary disease, unspecified: Secondary | ICD-10-CM | POA: Diagnosis present

## 2021-05-25 DIAGNOSIS — I110 Hypertensive heart disease with heart failure: Secondary | ICD-10-CM | POA: Diagnosis present

## 2021-05-25 DIAGNOSIS — G9341 Metabolic encephalopathy: Secondary | ICD-10-CM | POA: Diagnosis present

## 2021-05-25 DIAGNOSIS — R109 Unspecified abdominal pain: Secondary | ICD-10-CM | POA: Diagnosis not present

## 2021-05-25 DIAGNOSIS — D62 Acute posthemorrhagic anemia: Secondary | ICD-10-CM | POA: Diagnosis not present

## 2021-05-25 DIAGNOSIS — M6281 Muscle weakness (generalized): Secondary | ICD-10-CM | POA: Diagnosis not present

## 2021-05-25 DIAGNOSIS — K5730 Diverticulosis of large intestine without perforation or abscess without bleeding: Secondary | ICD-10-CM | POA: Diagnosis not present

## 2021-05-25 DIAGNOSIS — E860 Dehydration: Secondary | ICD-10-CM | POA: Diagnosis present

## 2021-05-25 DIAGNOSIS — Z452 Encounter for adjustment and management of vascular access device: Secondary | ICD-10-CM | POA: Diagnosis not present

## 2021-05-25 DIAGNOSIS — E46 Unspecified protein-calorie malnutrition: Secondary | ICD-10-CM | POA: Diagnosis present

## 2021-05-25 DIAGNOSIS — I517 Cardiomegaly: Secondary | ICD-10-CM | POA: Diagnosis not present

## 2021-05-25 DIAGNOSIS — M7989 Other specified soft tissue disorders: Secondary | ICD-10-CM | POA: Diagnosis not present

## 2021-05-25 DIAGNOSIS — Z7982 Long term (current) use of aspirin: Secondary | ICD-10-CM | POA: Diagnosis not present

## 2021-05-25 DIAGNOSIS — J984 Other disorders of lung: Secondary | ICD-10-CM | POA: Diagnosis not present

## 2021-05-25 DIAGNOSIS — M1990 Unspecified osteoarthritis, unspecified site: Secondary | ICD-10-CM | POA: Diagnosis present

## 2021-05-25 DIAGNOSIS — R1312 Dysphagia, oropharyngeal phase: Secondary | ICD-10-CM | POA: Diagnosis not present

## 2021-05-25 DIAGNOSIS — E871 Hypo-osmolality and hyponatremia: Secondary | ICD-10-CM | POA: Diagnosis not present

## 2021-05-25 DIAGNOSIS — Z751 Person awaiting admission to adequate facility elsewhere: Secondary | ICD-10-CM | POA: Diagnosis not present

## 2021-05-25 DIAGNOSIS — E119 Type 2 diabetes mellitus without complications: Secondary | ICD-10-CM | POA: Diagnosis present

## 2021-05-25 DIAGNOSIS — S72002A Fracture of unspecified part of neck of left femur, initial encounter for closed fracture: Secondary | ICD-10-CM | POA: Diagnosis not present

## 2021-05-25 DIAGNOSIS — S0990XA Unspecified injury of head, initial encounter: Secondary | ICD-10-CM | POA: Diagnosis not present

## 2021-05-25 DIAGNOSIS — R41841 Cognitive communication deficit: Secondary | ICD-10-CM | POA: Diagnosis not present

## 2021-05-25 DIAGNOSIS — W19XXXA Unspecified fall, initial encounter: Secondary | ICD-10-CM | POA: Diagnosis not present

## 2021-05-25 DIAGNOSIS — Z9181 History of falling: Secondary | ICD-10-CM | POA: Diagnosis not present

## 2021-05-25 DIAGNOSIS — I350 Nonrheumatic aortic (valve) stenosis: Secondary | ICD-10-CM | POA: Diagnosis not present

## 2021-05-25 DIAGNOSIS — Z8673 Personal history of transient ischemic attack (TIA), and cerebral infarction without residual deficits: Secondary | ICD-10-CM | POA: Diagnosis not present

## 2021-05-25 DIAGNOSIS — M25551 Pain in right hip: Secondary | ICD-10-CM | POA: Diagnosis not present

## 2021-05-25 DIAGNOSIS — I480 Paroxysmal atrial fibrillation: Secondary | ICD-10-CM | POA: Diagnosis present

## 2021-05-25 DIAGNOSIS — S72141A Displaced intertrochanteric fracture of right femur, initial encounter for closed fracture: Secondary | ICD-10-CM | POA: Diagnosis present

## 2021-05-25 DIAGNOSIS — I4891 Unspecified atrial fibrillation: Secondary | ICD-10-CM | POA: Diagnosis not present

## 2021-05-25 DIAGNOSIS — F0390 Unspecified dementia without behavioral disturbance: Secondary | ICD-10-CM | POA: Diagnosis not present

## 2021-05-25 DIAGNOSIS — I509 Heart failure, unspecified: Secondary | ICD-10-CM | POA: Diagnosis present

## 2021-05-25 DIAGNOSIS — Z66 Do not resuscitate: Secondary | ICD-10-CM | POA: Diagnosis not present

## 2021-05-25 DIAGNOSIS — I252 Old myocardial infarction: Secondary | ICD-10-CM | POA: Diagnosis not present

## 2021-05-25 DIAGNOSIS — J9811 Atelectasis: Secondary | ICD-10-CM | POA: Diagnosis not present

## 2021-05-25 DIAGNOSIS — E785 Hyperlipidemia, unspecified: Secondary | ICD-10-CM | POA: Diagnosis present

## 2021-05-25 DIAGNOSIS — S72001A Fracture of unspecified part of neck of right femur, initial encounter for closed fracture: Secondary | ICD-10-CM | POA: Diagnosis not present

## 2021-05-25 DIAGNOSIS — R0902 Hypoxemia: Secondary | ICD-10-CM | POA: Diagnosis not present

## 2021-05-25 DIAGNOSIS — R519 Headache, unspecified: Secondary | ICD-10-CM | POA: Diagnosis not present

## 2021-05-25 DIAGNOSIS — R52 Pain, unspecified: Secondary | ICD-10-CM | POA: Diagnosis not present

## 2021-05-25 DIAGNOSIS — Z9981 Dependence on supplemental oxygen: Secondary | ICD-10-CM | POA: Diagnosis not present

## 2021-05-25 DIAGNOSIS — R4020 Unspecified coma: Secondary | ICD-10-CM | POA: Diagnosis not present

## 2021-05-25 DIAGNOSIS — K219 Gastro-esophageal reflux disease without esophagitis: Secondary | ICD-10-CM | POA: Diagnosis present

## 2021-05-25 DIAGNOSIS — Z8679 Personal history of other diseases of the circulatory system: Secondary | ICD-10-CM | POA: Diagnosis not present

## 2021-05-25 DIAGNOSIS — M25512 Pain in left shoulder: Secondary | ICD-10-CM | POA: Diagnosis not present

## 2021-05-25 DIAGNOSIS — I739 Peripheral vascular disease, unspecified: Secondary | ICD-10-CM | POA: Diagnosis not present

## 2021-05-25 DIAGNOSIS — Z043 Encounter for examination and observation following other accident: Secondary | ICD-10-CM | POA: Diagnosis not present

## 2021-05-25 DIAGNOSIS — M47812 Spondylosis without myelopathy or radiculopathy, cervical region: Secondary | ICD-10-CM | POA: Diagnosis not present

## 2021-05-25 DIAGNOSIS — Z01810 Encounter for preprocedural cardiovascular examination: Secondary | ICD-10-CM | POA: Diagnosis not present

## 2021-05-25 DIAGNOSIS — R102 Pelvic and perineal pain: Secondary | ICD-10-CM | POA: Diagnosis not present

## 2021-05-25 DIAGNOSIS — S72001D Fracture of unspecified part of neck of right femur, subsequent encounter for closed fracture with routine healing: Secondary | ICD-10-CM | POA: Diagnosis not present

## 2021-05-25 DIAGNOSIS — F330 Major depressive disorder, recurrent, mild: Secondary | ICD-10-CM | POA: Diagnosis not present

## 2021-05-25 DIAGNOSIS — Z4789 Encounter for other orthopedic aftercare: Secondary | ICD-10-CM | POA: Diagnosis not present

## 2021-05-31 DIAGNOSIS — I110 Hypertensive heart disease with heart failure: Secondary | ICD-10-CM | POA: Diagnosis present

## 2021-05-31 DIAGNOSIS — S72141A Displaced intertrochanteric fracture of right femur, initial encounter for closed fracture: Secondary | ICD-10-CM | POA: Diagnosis not present

## 2021-05-31 DIAGNOSIS — E860 Dehydration: Secondary | ICD-10-CM | POA: Diagnosis present

## 2021-05-31 DIAGNOSIS — J90 Pleural effusion, not elsewhere classified: Secondary | ICD-10-CM | POA: Diagnosis not present

## 2021-05-31 DIAGNOSIS — Z4789 Encounter for other orthopedic aftercare: Secondary | ICD-10-CM | POA: Diagnosis not present

## 2021-05-31 DIAGNOSIS — I252 Old myocardial infarction: Secondary | ICD-10-CM | POA: Diagnosis not present

## 2021-05-31 DIAGNOSIS — K219 Gastro-esophageal reflux disease without esophagitis: Secondary | ICD-10-CM | POA: Diagnosis present

## 2021-05-31 DIAGNOSIS — T83511A Infection and inflammatory reaction due to indwelling urethral catheter, initial encounter: Secondary | ICD-10-CM | POA: Diagnosis present

## 2021-05-31 DIAGNOSIS — R0902 Hypoxemia: Secondary | ICD-10-CM | POA: Diagnosis not present

## 2021-05-31 DIAGNOSIS — R069 Unspecified abnormalities of breathing: Secondary | ICD-10-CM | POA: Diagnosis not present

## 2021-05-31 DIAGNOSIS — F339 Major depressive disorder, recurrent, unspecified: Secondary | ICD-10-CM | POA: Diagnosis not present

## 2021-05-31 DIAGNOSIS — E46 Unspecified protein-calorie malnutrition: Secondary | ICD-10-CM | POA: Diagnosis not present

## 2021-05-31 DIAGNOSIS — R0602 Shortness of breath: Secondary | ICD-10-CM | POA: Diagnosis not present

## 2021-05-31 DIAGNOSIS — I700 Atherosclerosis of aorta: Secondary | ICD-10-CM | POA: Diagnosis not present

## 2021-05-31 DIAGNOSIS — R41841 Cognitive communication deficit: Secondary | ICD-10-CM | POA: Diagnosis not present

## 2021-05-31 DIAGNOSIS — R1310 Dysphagia, unspecified: Secondary | ICD-10-CM | POA: Diagnosis not present

## 2021-05-31 DIAGNOSIS — R339 Retention of urine, unspecified: Secondary | ICD-10-CM | POA: Diagnosis not present

## 2021-05-31 DIAGNOSIS — R1312 Dysphagia, oropharyngeal phase: Secondary | ICD-10-CM | POA: Diagnosis not present

## 2021-05-31 DIAGNOSIS — R5381 Other malaise: Secondary | ICD-10-CM | POA: Diagnosis not present

## 2021-05-31 DIAGNOSIS — S72001A Fracture of unspecified part of neck of right femur, initial encounter for closed fracture: Secondary | ICD-10-CM | POA: Diagnosis not present

## 2021-05-31 DIAGNOSIS — M25551 Pain in right hip: Secondary | ICD-10-CM | POA: Diagnosis not present

## 2021-05-31 DIAGNOSIS — I350 Nonrheumatic aortic (valve) stenosis: Secondary | ICD-10-CM | POA: Diagnosis present

## 2021-05-31 DIAGNOSIS — G9341 Metabolic encephalopathy: Secondary | ICD-10-CM | POA: Diagnosis present

## 2021-05-31 DIAGNOSIS — R7303 Prediabetes: Secondary | ICD-10-CM | POA: Diagnosis present

## 2021-05-31 DIAGNOSIS — Z9981 Dependence on supplemental oxygen: Secondary | ICD-10-CM | POA: Diagnosis not present

## 2021-05-31 DIAGNOSIS — J441 Chronic obstructive pulmonary disease with (acute) exacerbation: Secondary | ICD-10-CM | POA: Diagnosis not present

## 2021-05-31 DIAGNOSIS — D649 Anemia, unspecified: Secondary | ICD-10-CM | POA: Diagnosis present

## 2021-05-31 DIAGNOSIS — I10 Essential (primary) hypertension: Secondary | ICD-10-CM | POA: Diagnosis present

## 2021-05-31 DIAGNOSIS — J80 Acute respiratory distress syndrome: Secondary | ICD-10-CM | POA: Diagnosis not present

## 2021-05-31 DIAGNOSIS — F330 Major depressive disorder, recurrent, mild: Secondary | ICD-10-CM | POA: Diagnosis not present

## 2021-05-31 DIAGNOSIS — S72001D Fracture of unspecified part of neck of right femur, subsequent encounter for closed fracture with routine healing: Secondary | ICD-10-CM | POA: Diagnosis not present

## 2021-05-31 DIAGNOSIS — M1990 Unspecified osteoarthritis, unspecified site: Secondary | ICD-10-CM | POA: Diagnosis present

## 2021-05-31 DIAGNOSIS — I482 Chronic atrial fibrillation, unspecified: Secondary | ICD-10-CM | POA: Diagnosis not present

## 2021-05-31 DIAGNOSIS — I480 Paroxysmal atrial fibrillation: Secondary | ICD-10-CM | POA: Diagnosis present

## 2021-05-31 DIAGNOSIS — J449 Chronic obstructive pulmonary disease, unspecified: Secondary | ICD-10-CM | POA: Diagnosis present

## 2021-05-31 DIAGNOSIS — J9621 Acute and chronic respiratory failure with hypoxia: Secondary | ICD-10-CM | POA: Diagnosis present

## 2021-05-31 DIAGNOSIS — M6281 Muscle weakness (generalized): Secondary | ICD-10-CM | POA: Diagnosis not present

## 2021-05-31 DIAGNOSIS — Z66 Do not resuscitate: Secondary | ICD-10-CM | POA: Diagnosis present

## 2021-05-31 DIAGNOSIS — I2510 Atherosclerotic heart disease of native coronary artery without angina pectoris: Secondary | ICD-10-CM | POA: Diagnosis present

## 2021-05-31 DIAGNOSIS — J9811 Atelectasis: Secondary | ICD-10-CM | POA: Diagnosis not present

## 2021-05-31 DIAGNOSIS — I959 Hypotension, unspecified: Secondary | ICD-10-CM | POA: Diagnosis not present

## 2021-05-31 DIAGNOSIS — R0989 Other specified symptoms and signs involving the circulatory and respiratory systems: Secondary | ICD-10-CM | POA: Diagnosis not present

## 2021-05-31 DIAGNOSIS — E785 Hyperlipidemia, unspecified: Secondary | ICD-10-CM | POA: Diagnosis present

## 2021-05-31 DIAGNOSIS — F0390 Unspecified dementia without behavioral disturbance: Secondary | ICD-10-CM | POA: Diagnosis present

## 2021-05-31 DIAGNOSIS — N390 Urinary tract infection, site not specified: Secondary | ICD-10-CM | POA: Diagnosis present

## 2021-05-31 DIAGNOSIS — E870 Hyperosmolality and hypernatremia: Secondary | ICD-10-CM | POA: Diagnosis present

## 2021-05-31 DIAGNOSIS — Z9181 History of falling: Secondary | ICD-10-CM | POA: Diagnosis not present

## 2021-05-31 DIAGNOSIS — Z8781 Personal history of (healed) traumatic fracture: Secondary | ICD-10-CM | POA: Diagnosis not present

## 2021-05-31 DIAGNOSIS — I5032 Chronic diastolic (congestive) heart failure: Secondary | ICD-10-CM | POA: Diagnosis present

## 2021-06-03 DIAGNOSIS — R5381 Other malaise: Secondary | ICD-10-CM | POA: Diagnosis not present

## 2021-06-03 DIAGNOSIS — S72001A Fracture of unspecified part of neck of right femur, initial encounter for closed fracture: Secondary | ICD-10-CM | POA: Diagnosis not present

## 2021-06-03 DIAGNOSIS — F339 Major depressive disorder, recurrent, unspecified: Secondary | ICD-10-CM | POA: Diagnosis not present

## 2021-06-03 DIAGNOSIS — F0390 Unspecified dementia without behavioral disturbance: Secondary | ICD-10-CM | POA: Diagnosis not present

## 2021-06-11 DIAGNOSIS — F0390 Unspecified dementia without behavioral disturbance: Secondary | ICD-10-CM | POA: Diagnosis not present

## 2021-06-11 DIAGNOSIS — S72001A Fracture of unspecified part of neck of right femur, initial encounter for closed fracture: Secondary | ICD-10-CM | POA: Diagnosis not present

## 2021-06-11 DIAGNOSIS — R5381 Other malaise: Secondary | ICD-10-CM | POA: Diagnosis not present

## 2021-06-11 DIAGNOSIS — I482 Chronic atrial fibrillation, unspecified: Secondary | ICD-10-CM | POA: Diagnosis not present

## 2021-06-17 DIAGNOSIS — R1310 Dysphagia, unspecified: Secondary | ICD-10-CM | POA: Diagnosis not present

## 2021-06-17 DIAGNOSIS — R339 Retention of urine, unspecified: Secondary | ICD-10-CM | POA: Diagnosis not present

## 2021-06-17 DIAGNOSIS — N390 Urinary tract infection, site not specified: Secondary | ICD-10-CM | POA: Diagnosis not present

## 2021-06-20 DIAGNOSIS — R339 Retention of urine, unspecified: Secondary | ICD-10-CM | POA: Diagnosis not present

## 2021-06-20 DIAGNOSIS — N390 Urinary tract infection, site not specified: Secondary | ICD-10-CM | POA: Diagnosis not present

## 2021-06-23 DIAGNOSIS — R0602 Shortness of breath: Secondary | ICD-10-CM | POA: Diagnosis not present

## 2021-06-23 DIAGNOSIS — J90 Pleural effusion, not elsewhere classified: Secondary | ICD-10-CM | POA: Diagnosis not present

## 2021-06-23 DIAGNOSIS — I480 Paroxysmal atrial fibrillation: Secondary | ICD-10-CM | POA: Diagnosis not present

## 2021-06-23 DIAGNOSIS — J9811 Atelectasis: Secondary | ICD-10-CM | POA: Diagnosis not present

## 2021-06-23 DIAGNOSIS — T83511A Infection and inflammatory reaction due to indwelling urethral catheter, initial encounter: Secondary | ICD-10-CM | POA: Diagnosis not present

## 2021-06-23 DIAGNOSIS — J441 Chronic obstructive pulmonary disease with (acute) exacerbation: Secondary | ICD-10-CM | POA: Diagnosis not present

## 2021-06-23 DIAGNOSIS — I700 Atherosclerosis of aorta: Secondary | ICD-10-CM | POA: Diagnosis not present

## 2021-06-24 DIAGNOSIS — K5900 Constipation, unspecified: Secondary | ICD-10-CM | POA: Diagnosis not present

## 2021-06-24 DIAGNOSIS — F0390 Unspecified dementia without behavioral disturbance: Secondary | ICD-10-CM | POA: Diagnosis present

## 2021-06-24 DIAGNOSIS — J9811 Atelectasis: Secondary | ICD-10-CM | POA: Diagnosis not present

## 2021-06-24 DIAGNOSIS — Z9181 History of falling: Secondary | ICD-10-CM | POA: Diagnosis not present

## 2021-06-24 DIAGNOSIS — Z66 Do not resuscitate: Secondary | ICD-10-CM | POA: Diagnosis present

## 2021-06-24 DIAGNOSIS — T83511A Infection and inflammatory reaction due to indwelling urethral catheter, initial encounter: Secondary | ICD-10-CM | POA: Diagnosis not present

## 2021-06-24 DIAGNOSIS — R7303 Prediabetes: Secondary | ICD-10-CM | POA: Diagnosis present

## 2021-06-24 DIAGNOSIS — J441 Chronic obstructive pulmonary disease with (acute) exacerbation: Secondary | ICD-10-CM | POA: Diagnosis not present

## 2021-06-24 DIAGNOSIS — R293 Abnormal posture: Secondary | ICD-10-CM | POA: Diagnosis not present

## 2021-06-24 DIAGNOSIS — I480 Paroxysmal atrial fibrillation: Secondary | ICD-10-CM | POA: Diagnosis not present

## 2021-06-24 DIAGNOSIS — J449 Chronic obstructive pulmonary disease, unspecified: Secondary | ICD-10-CM | POA: Diagnosis present

## 2021-06-24 DIAGNOSIS — I2510 Atherosclerotic heart disease of native coronary artery without angina pectoris: Secondary | ICD-10-CM | POA: Diagnosis present

## 2021-06-24 DIAGNOSIS — Z8781 Personal history of (healed) traumatic fracture: Secondary | ICD-10-CM | POA: Diagnosis not present

## 2021-06-24 DIAGNOSIS — R404 Transient alteration of awareness: Secondary | ICD-10-CM | POA: Diagnosis not present

## 2021-06-24 DIAGNOSIS — Z9981 Dependence on supplemental oxygen: Secondary | ICD-10-CM | POA: Diagnosis not present

## 2021-06-24 DIAGNOSIS — I509 Heart failure, unspecified: Secondary | ICD-10-CM | POA: Diagnosis not present

## 2021-06-24 DIAGNOSIS — I110 Hypertensive heart disease with heart failure: Secondary | ICD-10-CM | POA: Diagnosis present

## 2021-06-24 DIAGNOSIS — I252 Old myocardial infarction: Secondary | ICD-10-CM | POA: Diagnosis not present

## 2021-06-24 DIAGNOSIS — G9341 Metabolic encephalopathy: Secondary | ICD-10-CM | POA: Diagnosis not present

## 2021-06-24 DIAGNOSIS — J9611 Chronic respiratory failure with hypoxia: Secondary | ICD-10-CM | POA: Diagnosis not present

## 2021-06-24 DIAGNOSIS — J90 Pleural effusion, not elsewhere classified: Secondary | ICD-10-CM | POA: Diagnosis not present

## 2021-06-24 DIAGNOSIS — N390 Urinary tract infection, site not specified: Secondary | ICD-10-CM | POA: Diagnosis not present

## 2021-06-24 DIAGNOSIS — Z7401 Bed confinement status: Secondary | ICD-10-CM | POA: Diagnosis not present

## 2021-06-24 DIAGNOSIS — E870 Hyperosmolality and hypernatremia: Secondary | ICD-10-CM | POA: Diagnosis not present

## 2021-06-24 DIAGNOSIS — F339 Major depressive disorder, recurrent, unspecified: Secondary | ICD-10-CM | POA: Diagnosis not present

## 2021-06-24 DIAGNOSIS — R531 Weakness: Secondary | ICD-10-CM | POA: Diagnosis not present

## 2021-06-24 DIAGNOSIS — R0602 Shortness of breath: Secondary | ICD-10-CM | POA: Diagnosis not present

## 2021-06-24 DIAGNOSIS — E785 Hyperlipidemia, unspecified: Secondary | ICD-10-CM | POA: Diagnosis present

## 2021-06-24 DIAGNOSIS — I5032 Chronic diastolic (congestive) heart failure: Secondary | ICD-10-CM | POA: Diagnosis not present

## 2021-06-24 DIAGNOSIS — I350 Nonrheumatic aortic (valve) stenosis: Secondary | ICD-10-CM | POA: Diagnosis present

## 2021-06-24 DIAGNOSIS — R0902 Hypoxemia: Secondary | ICD-10-CM | POA: Diagnosis not present

## 2021-06-24 DIAGNOSIS — M1990 Unspecified osteoarthritis, unspecified site: Secondary | ICD-10-CM | POA: Diagnosis present

## 2021-06-24 DIAGNOSIS — D649 Anemia, unspecified: Secondary | ICD-10-CM | POA: Diagnosis present

## 2021-06-24 DIAGNOSIS — K219 Gastro-esophageal reflux disease without esophagitis: Secondary | ICD-10-CM | POA: Diagnosis present

## 2021-06-24 DIAGNOSIS — I700 Atherosclerosis of aorta: Secondary | ICD-10-CM | POA: Diagnosis not present

## 2021-06-24 DIAGNOSIS — R1312 Dysphagia, oropharyngeal phase: Secondary | ICD-10-CM | POA: Diagnosis not present

## 2021-06-24 DIAGNOSIS — S72001D Fracture of unspecified part of neck of right femur, subsequent encounter for closed fracture with routine healing: Secondary | ICD-10-CM | POA: Diagnosis not present

## 2021-06-24 DIAGNOSIS — I10 Essential (primary) hypertension: Secondary | ICD-10-CM | POA: Diagnosis present

## 2021-06-24 DIAGNOSIS — E860 Dehydration: Secondary | ICD-10-CM | POA: Diagnosis present

## 2021-06-24 DIAGNOSIS — J9621 Acute and chronic respiratory failure with hypoxia: Secondary | ICD-10-CM | POA: Diagnosis not present

## 2021-06-26 DIAGNOSIS — J449 Chronic obstructive pulmonary disease, unspecified: Secondary | ICD-10-CM | POA: Diagnosis not present

## 2021-06-26 DIAGNOSIS — R531 Weakness: Secondary | ICD-10-CM | POA: Diagnosis not present

## 2021-06-26 DIAGNOSIS — D649 Anemia, unspecified: Secondary | ICD-10-CM | POA: Diagnosis not present

## 2021-06-26 DIAGNOSIS — F339 Major depressive disorder, recurrent, unspecified: Secondary | ICD-10-CM | POA: Diagnosis not present

## 2021-06-26 DIAGNOSIS — R293 Abnormal posture: Secondary | ICD-10-CM | POA: Diagnosis not present

## 2021-06-26 DIAGNOSIS — I2510 Atherosclerotic heart disease of native coronary artery without angina pectoris: Secondary | ICD-10-CM | POA: Diagnosis not present

## 2021-06-26 DIAGNOSIS — M1990 Unspecified osteoarthritis, unspecified site: Secondary | ICD-10-CM | POA: Diagnosis not present

## 2021-06-26 DIAGNOSIS — I5030 Unspecified diastolic (congestive) heart failure: Secondary | ICD-10-CM | POA: Diagnosis not present

## 2021-06-26 DIAGNOSIS — J9610 Chronic respiratory failure, unspecified whether with hypoxia or hypercapnia: Secondary | ICD-10-CM | POA: Diagnosis not present

## 2021-06-26 DIAGNOSIS — I480 Paroxysmal atrial fibrillation: Secondary | ICD-10-CM | POA: Diagnosis not present

## 2021-06-26 DIAGNOSIS — T83511A Infection and inflammatory reaction due to indwelling urethral catheter, initial encounter: Secondary | ICD-10-CM | POA: Diagnosis not present

## 2021-06-26 DIAGNOSIS — Z515 Encounter for palliative care: Secondary | ICD-10-CM | POA: Diagnosis not present

## 2021-06-26 DIAGNOSIS — Z7401 Bed confinement status: Secondary | ICD-10-CM | POA: Diagnosis not present

## 2021-06-26 DIAGNOSIS — I10 Essential (primary) hypertension: Secondary | ICD-10-CM | POA: Diagnosis not present

## 2021-06-26 DIAGNOSIS — J9621 Acute and chronic respiratory failure with hypoxia: Secondary | ICD-10-CM | POA: Diagnosis not present

## 2021-06-26 DIAGNOSIS — R1312 Dysphagia, oropharyngeal phase: Secondary | ICD-10-CM | POA: Diagnosis not present

## 2021-06-26 DIAGNOSIS — K219 Gastro-esophageal reflux disease without esophagitis: Secondary | ICD-10-CM | POA: Diagnosis not present

## 2021-06-26 DIAGNOSIS — E785 Hyperlipidemia, unspecified: Secondary | ICD-10-CM | POA: Diagnosis not present

## 2021-06-26 DIAGNOSIS — R0902 Hypoxemia: Secondary | ICD-10-CM | POA: Diagnosis not present

## 2021-06-26 DIAGNOSIS — I509 Heart failure, unspecified: Secondary | ICD-10-CM | POA: Diagnosis not present

## 2021-06-26 DIAGNOSIS — K5900 Constipation, unspecified: Secondary | ICD-10-CM | POA: Diagnosis not present

## 2021-06-26 DIAGNOSIS — J9611 Chronic respiratory failure with hypoxia: Secondary | ICD-10-CM | POA: Diagnosis not present

## 2021-06-26 DIAGNOSIS — R404 Transient alteration of awareness: Secondary | ICD-10-CM | POA: Diagnosis not present

## 2021-06-26 DIAGNOSIS — S72001D Fracture of unspecified part of neck of right femur, subsequent encounter for closed fracture with routine healing: Secondary | ICD-10-CM | POA: Diagnosis not present

## 2021-06-26 DIAGNOSIS — G9341 Metabolic encephalopathy: Secondary | ICD-10-CM | POA: Diagnosis not present

## 2021-06-26 DIAGNOSIS — I350 Nonrheumatic aortic (valve) stenosis: Secondary | ICD-10-CM | POA: Diagnosis not present

## 2021-06-26 DIAGNOSIS — F0390 Unspecified dementia without behavioral disturbance: Secondary | ICD-10-CM | POA: Diagnosis not present

## 2021-06-28 DIAGNOSIS — I10 Essential (primary) hypertension: Secondary | ICD-10-CM | POA: Diagnosis not present

## 2021-06-28 DIAGNOSIS — F0390 Unspecified dementia without behavioral disturbance: Secondary | ICD-10-CM | POA: Diagnosis not present

## 2021-06-28 DIAGNOSIS — Z515 Encounter for palliative care: Secondary | ICD-10-CM | POA: Diagnosis not present

## 2021-06-28 DIAGNOSIS — I480 Paroxysmal atrial fibrillation: Secondary | ICD-10-CM | POA: Diagnosis not present

## 2021-06-28 DIAGNOSIS — I350 Nonrheumatic aortic (valve) stenosis: Secondary | ICD-10-CM | POA: Diagnosis not present

## 2021-06-28 DIAGNOSIS — I5030 Unspecified diastolic (congestive) heart failure: Secondary | ICD-10-CM | POA: Diagnosis not present

## 2021-06-28 DIAGNOSIS — K219 Gastro-esophageal reflux disease without esophagitis: Secondary | ICD-10-CM | POA: Diagnosis not present

## 2021-07-01 DIAGNOSIS — J9610 Chronic respiratory failure, unspecified whether with hypoxia or hypercapnia: Secondary | ICD-10-CM | POA: Diagnosis not present

## 2021-07-08 DEATH — deceased
